# Patient Record
Sex: Male | Born: 1950 | Race: White | Hispanic: No | Smoking: Current some day smoker
Health system: Southern US, Community
[De-identification: ages and names within clinical notes are randomized; demographics above are authoritative.]

## PROBLEM LIST (undated history)

## (undated) DIAGNOSIS — D72829 Elevated white blood cell count, unspecified: Secondary | ICD-10-CM

## (undated) DIAGNOSIS — E1169 Type 2 diabetes mellitus with other specified complication: Secondary | ICD-10-CM

## (undated) DIAGNOSIS — Z8619 Personal history of other infectious and parasitic diseases: Secondary | ICD-10-CM

## (undated) DIAGNOSIS — Z8673 Personal history of transient ischemic attack (TIA), and cerebral infarction without residual deficits: Secondary | ICD-10-CM

## (undated) DIAGNOSIS — R140 Abdominal distension (gaseous): Secondary | ICD-10-CM

## (undated) DIAGNOSIS — I509 Heart failure, unspecified: Secondary | ICD-10-CM

## (undated) DIAGNOSIS — M1990 Unspecified osteoarthritis, unspecified site: Secondary | ICD-10-CM

## (undated) DIAGNOSIS — G609 Hereditary and idiopathic neuropathy, unspecified: Secondary | ICD-10-CM

## (undated) DIAGNOSIS — I350 Nonrheumatic aortic (valve) stenosis: Secondary | ICD-10-CM

## (undated) DIAGNOSIS — I10 Essential (primary) hypertension: Secondary | ICD-10-CM

## (undated) DIAGNOSIS — Z720 Tobacco use: Secondary | ICD-10-CM

## (undated) DIAGNOSIS — F329 Major depressive disorder, single episode, unspecified: Secondary | ICD-10-CM

## (undated) DIAGNOSIS — T7840XA Allergy, unspecified, initial encounter: Secondary | ICD-10-CM

## (undated) DIAGNOSIS — E785 Hyperlipidemia, unspecified: Secondary | ICD-10-CM

## (undated) DIAGNOSIS — F32A Depression, unspecified: Secondary | ICD-10-CM

## (undated) DIAGNOSIS — J302 Other seasonal allergic rhinitis: Secondary | ICD-10-CM

## (undated) DIAGNOSIS — Z8709 Personal history of other diseases of the respiratory system: Secondary | ICD-10-CM

## (undated) DIAGNOSIS — E669 Obesity, unspecified: Secondary | ICD-10-CM

## (undated) DIAGNOSIS — I639 Cerebral infarction, unspecified: Secondary | ICD-10-CM

## (undated) HISTORY — DX: Type 2 diabetes mellitus with other specified complication: E11.69

## (undated) HISTORY — PX: KNEE SURGERY: SHX244

## (undated) HISTORY — DX: Nonrheumatic aortic (valve) stenosis: I35.0

## (undated) HISTORY — DX: Personal history of other diseases of the respiratory system: Z87.09

## (undated) HISTORY — DX: Other seasonal allergic rhinitis: J30.2

## (undated) HISTORY — DX: Elevated white blood cell count, unspecified: D72.829

## (undated) HISTORY — DX: Allergy, unspecified, initial encounter: T78.40XA

## (undated) HISTORY — DX: Hyperlipidemia, unspecified: E78.5

## (undated) HISTORY — DX: Hereditary and idiopathic neuropathy, unspecified: G60.9

## (undated) HISTORY — DX: Heart failure, unspecified: I50.9

## (undated) HISTORY — DX: Essential (primary) hypertension: I10

## (undated) HISTORY — DX: Depression, unspecified: F32.A

## (undated) HISTORY — DX: Obesity, unspecified: E66.9

## (undated) HISTORY — DX: Personal history of other infectious and parasitic diseases: Z86.19

## (undated) HISTORY — DX: Tobacco use: Z72.0

## (undated) HISTORY — DX: Abdominal distension (gaseous): R14.0

## (undated) HISTORY — DX: Major depressive disorder, single episode, unspecified: F32.9

## (undated) HISTORY — DX: Unspecified osteoarthritis, unspecified site: M19.90

## (undated) HISTORY — DX: Personal history of transient ischemic attack (TIA), and cerebral infarction without residual deficits: Z86.73

---

## 1980-10-18 HISTORY — PX: LUMBAR FUSION: SHX111

## 1990-10-18 DIAGNOSIS — Z8673 Personal history of transient ischemic attack (TIA), and cerebral infarction without residual deficits: Secondary | ICD-10-CM

## 1990-10-18 HISTORY — DX: Personal history of transient ischemic attack (TIA), and cerebral infarction without residual deficits: Z86.73

## 2005-10-18 HISTORY — PX: REVISION TOTAL KNEE ARTHROPLASTY: SHX767

## 2011-09-01 ENCOUNTER — Encounter: Payer: Self-pay | Admitting: Internal Medicine

## 2011-09-01 ENCOUNTER — Ambulatory Visit (INDEPENDENT_AMBULATORY_CARE_PROVIDER_SITE_OTHER): Payer: Commercial Managed Care - PPO | Admitting: Internal Medicine

## 2011-09-01 ENCOUNTER — Telehealth: Payer: Self-pay | Admitting: Internal Medicine

## 2011-09-01 VITALS — BP 134/90 | HR 73 | Temp 97.7°F | Resp 20 | Ht 71.5 in | Wt 267.0 lb

## 2011-09-01 DIAGNOSIS — I10 Essential (primary) hypertension: Secondary | ICD-10-CM | POA: Insufficient documentation

## 2011-09-01 DIAGNOSIS — E119 Type 2 diabetes mellitus without complications: Secondary | ICD-10-CM

## 2011-09-01 DIAGNOSIS — R011 Cardiac murmur, unspecified: Secondary | ICD-10-CM | POA: Insufficient documentation

## 2011-09-01 DIAGNOSIS — Z23 Encounter for immunization: Secondary | ICD-10-CM

## 2011-09-01 DIAGNOSIS — E785 Hyperlipidemia, unspecified: Secondary | ICD-10-CM

## 2011-09-01 LAB — HEPATIC FUNCTION PANEL
ALT: 21 U/L (ref 0–53)
AST: 17 U/L (ref 0–37)
Albumin: 5 g/dL (ref 3.5–5.2)
Alkaline Phosphatase: 68 U/L (ref 39–117)
Indirect Bilirubin: 0.3 mg/dL (ref 0.0–0.9)
Total Protein: 7.4 g/dL (ref 6.0–8.3)

## 2011-09-01 LAB — HEMOGLOBIN A1C: Mean Plasma Glucose: 183 mg/dL — ABNORMAL HIGH (ref <117)

## 2011-09-01 LAB — BASIC METABOLIC PANEL
BUN: 19 mg/dL (ref 6–23)
Chloride: 101 mEq/L (ref 96–112)
Creat: 1.26 mg/dL (ref 0.50–1.35)
Glucose, Bld: 113 mg/dL — ABNORMAL HIGH (ref 70–99)

## 2011-09-01 LAB — CBC
HCT: 45.9 % (ref 39.0–52.0)
Hemoglobin: 15.9 g/dL (ref 13.0–17.0)
MCHC: 34.6 g/dL (ref 30.0–36.0)
MCV: 92.9 fL (ref 78.0–100.0)
RDW: 13.6 % (ref 11.5–15.5)
WBC: 11.2 10*3/uL — ABNORMAL HIGH (ref 4.0–10.5)

## 2011-09-01 MED ORDER — AMITRIPTYLINE HCL 10 MG PO TABS: 10.0000 mg | ORAL_TABLET | Freq: Every day | ORAL | Status: DC

## 2011-09-01 NOTE — Assessment & Plan Note (Signed)
Asx. Next visit discuss f/u echo

## 2011-09-01 NOTE — Progress Notes (Signed)
  Subjective:    Patient ID: Gabriel [name], male    DOB: 1951/01/07, 60 y.o.   MRN: [id number]  HPI Pt presents to clinic to establish care and for evaluation of multiple medical problems. H/o dm with fsbs log reviewed. Variable results with range 90's to upper 100's. No hypoglycemia. Attempting to make dietary changes with PP fsbs as a guide. Dm eye exam utd. No feet paresthesias. Tolerates statin tx without myalgias or abn lft's. H/o murmur s/p echo `1 year ago with no recommendation for intervention. bp minimally elevated but typically normotensive. Has single episode of ?CHF with reportedly subsequent nl cardiac catheterization  2011.no other complaints.  Reviewed pmh, psh, medications, allergies, soc hx and fam hx    Review of Systems  Constitutional: Negative for fever and chills.  Respiratory: Negative for cough and shortness of breath.   Cardiovascular: Negative for chest pain.  Neurological: Negative for numbness.  All other systems reviewed and are negative.       Objective:   Physical Exam  Physical Exam  Nursing note and vitals reviewed. Constitutional: Appears well-developed and well-nourished. No distress.  HENT:  Head: Normocephalic and atraumatic.  Right Ear: External ear normal.  Left Ear: External ear normal.  Eyes: Conjunctivae are normal. No scleral icterus.  Neck: Neck supple. Carotid bruit is not present.  Cardiovascular: Normal rate, regular rhythm and normal heart sounds.  Exam reveals no gallop and no friction rub.   3/6 SM best heard rsb. Pulmonary/Chest: Effort normal and breath sounds normal. No respiratory distress. He has no wheezes. no rales.  Lymphadenopathy:    He has no cervical adenopathy.  Neurological:Alert.  Skin: Skin is warm and dry. Not diaphoretic.  Psychiatric: Has a normal mood and affect.   Diabetic foot exam: +2 DP pulses, no diabetic wounds, ulcerations or significant callousing. Monofilament exam nl.       Assessment & Plan:

## 2011-09-01 NOTE — Assessment & Plan Note (Signed)
Obtain cbc, chem7, a1c, urine microalbumin 

## 2011-09-01 NOTE — Assessment & Plan Note (Signed)
Not fasting. Obtain lft. Obtain lipid prior to next visit.

## 2011-09-01 NOTE — Patient Instructions (Signed)
Please schedule chem7, a1c 250.0 and lipid 272.4 prior to next visit

## 2011-09-01 NOTE — Assessment & Plan Note (Signed)
Normotensive and stable. Continue current regimen. Monitor bp as outpt and followup in clinic as scheduled.  

## 2011-09-02 LAB — MICROALBUMIN / CREATININE URINE RATIO: Microalb Creat Ratio: 339.8 mg/g — ABNORMAL HIGH (ref 0.0–30.0)

## 2011-09-02 NOTE — Telephone Encounter (Signed)
Lab orders entered for February 2013. 

## 2011-09-03 ENCOUNTER — Other Ambulatory Visit: Payer: Self-pay | Admitting: *Deleted

## 2011-09-03 MED ORDER — INSULIN NPH ISOPHANE & REGULAR (70-30) 100 UNIT/ML ~~LOC~~ SUSP: SUBCUTANEOUS | Status: DC

## 2011-09-03 NOTE — Telephone Encounter (Signed)
Patient called requesting refill on insulin to  [facility] mail order. Rx refill sent to pharmacy.

## 2011-09-10 ENCOUNTER — Other Ambulatory Visit: Payer: Self-pay | Admitting: *Deleted

## 2011-09-10 ENCOUNTER — Telehealth: Payer: Self-pay | Admitting: Internal Medicine

## 2011-09-10 MED ORDER — LISINOPRIL 20 MG PO TABS: ORAL_TABLET | ORAL | Status: DC

## 2011-09-10 MED ORDER — LISINOPRIL 20 MG PO TABS: 30.0000 mg | ORAL_TABLET | Freq: Every day | ORAL | Status: DC

## 2011-09-10 NOTE — Telephone Encounter (Signed)
The sig says to take 1.5 pills (30mg  per day) and it also says take 2 pills per day.  Please advise pharmacy which is correct

## 2011-09-10 NOTE — Telephone Encounter (Signed)
Rx resent to reflect correct instructions.

## 2011-09-10 NOTE — Telephone Encounter (Signed)
Rx  sent  to  [facility]

## 2011-09-22 ENCOUNTER — Telehealth: Payer: Self-pay | Admitting: *Deleted

## 2011-09-22 MED ORDER — INSULIN NPH ISOPHANE & REGULAR (70-30) 100 UNIT/ML ~~LOC~~ SUSP: SUBCUTANEOUS | Status: DC

## 2011-09-22 MED ORDER — GEMFIBROZIL 600 MG PO TABS: 600.0000 mg | ORAL_TABLET | Freq: Two times a day (BID) | ORAL | Status: DC

## 2011-09-22 NOTE — Telephone Encounter (Signed)
Fax received from [facility] needing clarification on whether patient uses vials or pens.  Call placed to patient at [phone], he has verified that he uses the vials. He stated that he is will need a refill on Lopid to [facility] pharmacy.  Rx verification and refill sent to pharmacy.

## 2011-09-30 ENCOUNTER — Telehealth: Payer: Self-pay | Admitting: Internal Medicine

## 2011-09-30 MED ORDER — FUROSEMIDE 40 MG PO TABS: 40.0000 mg | ORAL_TABLET | Freq: Every day | ORAL | Status: DC

## 2011-09-30 NOTE — Telephone Encounter (Signed)
Received voice message from [facility] that they are needing clarification of Humulin Rx sent in on 09/03/11. Called [phone] with ref# [id number] and advised Gabriel [name] that clarification was sent back to them on 09/22/11. She states they did not receive transmission and verbal was given. They will dispense 18 vials for a 90 day supply with 1 refill.

## 2011-09-30 NOTE — Telephone Encounter (Signed)
[facility] [address]

## 2011-10-20 ENCOUNTER — Telehealth: Payer: Self-pay | Admitting: Internal Medicine

## 2011-10-20 NOTE — Telephone Encounter (Signed)
Attempted to reach pt for clarification of current dose and directions. Left detailed message to call us with clarification.

## 2011-10-20 NOTE — Telephone Encounter (Signed)
Patient states that he called [facility] [address] to refill sertaline but pharmacy told him to call us for refill

## 2011-10-21 MED ORDER — SERTRALINE HCL 100 MG PO TABS: ORAL_TABLET | ORAL | Status: DC

## 2011-10-21 NOTE — Telephone Encounter (Signed)
Pt returned my call and states that he takes 150mg  of Sertraline daily. He takes 1 1/2  100mg  tabs daily. Refill sent to pharmacy #[id number] x 1 refill.

## 2011-10-26 ENCOUNTER — Other Ambulatory Visit: Payer: Self-pay | Admitting: *Deleted

## 2011-10-26 MED ORDER — CARVEDILOL 25 MG PO TABS: ORAL_TABLET | ORAL | Status: DC

## 2011-10-26 NOTE — Telephone Encounter (Signed)
Pt called requesting refill of Carvedilol be sent to [facility] on [address] in [place].

## 2011-11-05 ENCOUNTER — Ambulatory Visit (INDEPENDENT_AMBULATORY_CARE_PROVIDER_SITE_OTHER): Payer: Commercial Managed Care - PPO | Admitting: Internal Medicine

## 2011-11-05 ENCOUNTER — Encounter: Payer: Self-pay | Admitting: Internal Medicine

## 2011-11-05 ENCOUNTER — Telehealth: Payer: Self-pay | Admitting: *Deleted

## 2011-11-05 VITALS — BP 150/90 | HR 73 | Temp 98.0°F | Resp 20 | Ht 71.5 in | Wt 268.0 lb

## 2011-11-05 DIAGNOSIS — R079 Chest pain, unspecified: Secondary | ICD-10-CM

## 2011-11-05 DIAGNOSIS — R0781 Pleurodynia: Secondary | ICD-10-CM

## 2011-11-05 MED ORDER — METFORMIN HCL 1000 MG PO TABS: 1000.0000 mg | ORAL_TABLET | Freq: Two times a day (BID) | ORAL | Status: DC

## 2011-11-05 NOTE — Progress Notes (Signed)
  Subjective:    Patient ID: Gabriel [name], male    DOB: 07-13-1951, 61 y.o.   MRN: [id number]  HPI Pt presents to clinic for ED follow up of fall. Seen at local ED 1/17 after accidental fall (stepped on stick) and fell with his left hand against his left chest. Also struck left anterior knee. Underwent neg CXR without ptx or fx. Did not strike head or suffer loc. Given vicodin but took one dose then began otc nsaid prn. Left knee suffered abrasion that he is using topical abx. No knee instability. No further falls. Needs return to work note. No other complaints.  Past Medical History  Diagnosis Date  . Arthritis   . Depression   . History of chronic bronchitis     dx 2011  . Seasonal allergies   . Heart murmur   . Hypertension   . Diabetes mellitus, type 2   . Hyperlipidemia   . 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 Congestive heart failure     September 2011-CHF   Past Surgical History  Procedure Date  . Knee surgery 1976    right knee torn ligament repair  . Lumbar fusion 1982    L4-L5  . Revision total knee arthroplasty 2007    right knee replacement    reports that he has been smoking.  He has never used smokeless tobacco. He reports that he drinks alcohol. He reports that he does not use illicit drugs. family history includes Arthritis in an unspecified family member; Diabetes in his brother and paternal grandmother; Emotional abuse in his mother; Heart disease in his father; and Hypertension in his brother, father, and paternal grandfather. No Known Allergies   Review of Systems see hpi     Objective:   Physical Exam  Nursing note and vitals reviewed. Constitutional: He appears well-developed and well-nourished.  HENT:  Head: Normocephalic and atraumatic.  Right Ear: External ear normal.  Left Ear: External ear normal.  Pulmonary/Chest: Effort normal and breath sounds normal. No respiratory distress. He has no wheezes.    Musculoskeletal:       Left knee abrasion. Gait nl  Neurological: He is alert.  Skin: Skin is warm.  Psychiatric: He has a normal mood and affect.          Assessment & Plan:

## 2011-11-05 NOTE — Telephone Encounter (Signed)
Pt called back stating he was seen at [facility] ED on 11/04/11. Request for records faxed to [phone].

## 2011-11-05 NOTE — Telephone Encounter (Signed)
Pt called requesting refill of metformin be sent to [facility] on [address]. Also requests ER f/u to get clearance to return to work after a fall. Pt requests appt today. Appt given for 2:15pm with Dr Rodena Medin. Refill sent to pharmacy #[id number] x 2 refills.

## 2011-11-05 NOTE — Telephone Encounter (Signed)
Left message for pt to return my call with name or ER and date of ER visit.

## 2011-11-07 DIAGNOSIS — R0781 Pleurodynia: Secondary | ICD-10-CM | POA: Insufficient documentation

## 2011-11-07 NOTE — Assessment & Plan Note (Signed)
Use otc pain medication prn. Return to work note provided. Followup if no improvement or worsening.

## 2011-11-18 ENCOUNTER — Telehealth: Payer: Self-pay | Admitting: Internal Medicine

## 2011-11-18 MED ORDER — LISINOPRIL 30 MG PO TABS: ORAL_TABLET | ORAL | Status: DC

## 2011-11-18 NOTE — Telephone Encounter (Signed)
Rx refill sent to pharmacy. 

## 2011-11-19 ENCOUNTER — Telehealth: Payer: Self-pay | Admitting: Internal Medicine

## 2011-11-19 NOTE — Telephone Encounter (Signed)
Pharmacy called for verification of metformin dosing. They needed clarification on dosing instructions. Pharmacist was informed patient current dose is 30 mg once a day. Medication corrected in chart to reflect current dosing instructions.

## 2011-11-29 ENCOUNTER — Other Ambulatory Visit: Payer: Self-pay | Admitting: *Deleted

## 2011-11-29 NOTE — Telephone Encounter (Signed)
Received message from pt requesting refill of Amlodipine. Left message for pt to call with name of pharmacy.

## 2011-11-30 MED ORDER — AMLODIPINE BESYLATE 10 MG PO TABS: 10.0000 mg | ORAL_TABLET | Freq: Every day | ORAL | Status: DC

## 2011-11-30 MED ORDER — PRAVASTATIN SODIUM 20 MG PO TABS: 20.0000 mg | ORAL_TABLET | Freq: Every evening | ORAL | Status: DC

## 2011-11-30 NOTE — Telephone Encounter (Signed)
Pt left voice message to send refill to [facility] on [address]. Refills sent.

## 2011-12-02 ENCOUNTER — Encounter: Payer: Self-pay | Admitting: Internal Medicine

## 2011-12-02 ENCOUNTER — Ambulatory Visit (INDEPENDENT_AMBULATORY_CARE_PROVIDER_SITE_OTHER): Payer: Commercial Managed Care - PPO | Admitting: Internal Medicine

## 2011-12-02 ENCOUNTER — Ambulatory Visit (HOSPITAL_BASED_OUTPATIENT_CLINIC_OR_DEPARTMENT_OTHER)
Admission: RE | Admit: 2011-12-02 | Discharge: 2011-12-02 | Disposition: A | Discharge status: Home / Self Care | Payer: Commercial Managed Care - PPO | Source: Ambulatory Visit | Attending: Internal Medicine | Admitting: Internal Medicine

## 2011-12-02 DIAGNOSIS — M79646 Pain in unspecified finger(s): Secondary | ICD-10-CM

## 2011-12-02 DIAGNOSIS — M79609 Pain in unspecified limb: Secondary | ICD-10-CM

## 2011-12-02 DIAGNOSIS — E119 Type 2 diabetes mellitus without complications: Secondary | ICD-10-CM

## 2011-12-02 DIAGNOSIS — E785 Hyperlipidemia, unspecified: Secondary | ICD-10-CM

## 2011-12-02 DIAGNOSIS — R011 Cardiac murmur, unspecified: Secondary | ICD-10-CM

## 2011-12-02 LAB — HEMOGLOBIN A1C
Hgb A1c MFr Bld: 8.5 % — ABNORMAL HIGH (ref <5.7)
Mean Plasma Glucose: 197 mg/dL — ABNORMAL HIGH (ref <117)

## 2011-12-02 LAB — BASIC METABOLIC PANEL
Calcium: 9.9 mg/dL (ref 8.4–10.5)
Creat: 0.95 mg/dL (ref 0.50–1.35)
Sodium: 136 mEq/L (ref 135–145)

## 2011-12-02 LAB — LIPID PANEL
Cholesterol: 161 mg/dL (ref 0–200)
Triglycerides: 372 mg/dL — ABNORMAL HIGH (ref <150)
VLDL: 74 mg/dL — ABNORMAL HIGH (ref 0–40)

## 2011-12-02 MED ORDER — MELOXICAM 7.5 MG PO TABS: 7.5000 mg | ORAL_TABLET | Freq: Every day | ORAL | Status: AC | PRN

## 2011-12-02 NOTE — Progress Notes (Signed)
  Subjective:    Patient ID: Gabriel [name], male    DOB: 08/28/1951, 61 y.o.   MRN: [id number]  HPI Pt presents to clinic for followup of multiple medical problems. Notes right thumb pain without injury or trauma. No inflammatory changes. fsbs range 75-230 with typical range 130-135. bp reviewed as normotensive. Diabetic eye exam pending. No other complaints.  Past Medical History  Diagnosis Date  . Arthritis   . Depression   . History of chronic bronchitis     dx 2011  . Seasonal allergies   . Heart murmur   . Hypertension   . Diabetes mellitus, type 2   . Hyperlipidemia   . 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 Congestive heart failure     September 2011-CHF   Past Surgical History  Procedure Date  . Knee surgery 1976    right knee torn ligament repair  . Lumbar fusion 1982    L4-L5  . Revision total knee arthroplasty 2007    right knee replacement    reports that he has been smoking.  He has never used smokeless tobacco. He reports that he drinks alcohol. He reports that he does not use illicit drugs. family history includes Arthritis in an unspecified family member; Diabetes in his brother and paternal grandmother; Emotional abuse in his mother; Heart disease in his father; and Hypertension in his brother, father, and paternal grandfather. No Known Allergies    Review of Systems see hpi     Objective:   Physical Exam  Physical Exam  Nursing note and vitals reviewed. Constitutional: Appears well-developed and well-nourished. No distress.  HENT:  Head: Normocephalic and atraumatic.  Right Ear: External ear normal.  Left Ear: External ear normal.  Eyes: Conjunctivae are normal. No scleral icterus.  Neck: Neck supple. Carotid bruit is not present.  Cardiovascular: Normal rate, regular rhythm and normal heart sounds.  Exam reveals no gallop and no friction rub.   +3/6 best heard at RSB Pulmonary/Chest: Effort normal and  breath sounds normal. No respiratory distress. He has no wheezes. no rales.  Lymphadenopathy:    He has no cervical adenopathy.  Neurological:Alert.  Skin: Skin is warm and dry. Not diaphoretic.  Psychiatric: Has a normal mood and affect.        Assessment & Plan:

## 2011-12-02 NOTE — Patient Instructions (Signed)
Please schedule cbc, chem7, a1c, urine microalbumin-250.0 and lipid/lft 272.4, psa (prostate cancer screening) prior to next visit. We will schedule an echocardiogram to evaluate your heart murmur

## 2011-12-12 DIAGNOSIS — M79646 Pain in unspecified finger(s): Secondary | ICD-10-CM | POA: Insufficient documentation

## 2011-12-12 NOTE — Assessment & Plan Note (Signed)
Obtain chem7 and a1c 

## 2011-12-12 NOTE — Assessment & Plan Note (Signed)
Schedule 2d echo 

## 2011-12-12 NOTE — Assessment & Plan Note (Signed)
Obtain right thumb xray. Attempt mobic prn with food and no other nsaids.

## 2011-12-14 ENCOUNTER — Ambulatory Visit (HOSPITAL_COMMUNITY): Payer: Commercial Managed Care - PPO | Attending: Cardiovascular Disease

## 2011-12-14 ENCOUNTER — Other Ambulatory Visit: Payer: Self-pay

## 2011-12-14 DIAGNOSIS — E785 Hyperlipidemia, unspecified: Secondary | ICD-10-CM

## 2011-12-14 DIAGNOSIS — E119 Type 2 diabetes mellitus without complications: Secondary | ICD-10-CM | POA: Insufficient documentation

## 2011-12-14 DIAGNOSIS — Z125 Encounter for screening for malignant neoplasm of prostate: Secondary | ICD-10-CM

## 2011-12-14 DIAGNOSIS — I079 Rheumatic tricuspid valve disease, unspecified: Secondary | ICD-10-CM | POA: Insufficient documentation

## 2011-12-14 DIAGNOSIS — I080 Rheumatic disorders of both mitral and aortic valves: Secondary | ICD-10-CM | POA: Insufficient documentation

## 2011-12-14 DIAGNOSIS — R011 Cardiac murmur, unspecified: Secondary | ICD-10-CM | POA: Insufficient documentation

## 2011-12-14 DIAGNOSIS — I10 Essential (primary) hypertension: Secondary | ICD-10-CM | POA: Insufficient documentation

## 2011-12-17 ENCOUNTER — Telehealth: Payer: Self-pay | Admitting: *Deleted

## 2011-12-17 NOTE — Telephone Encounter (Signed)
Call placed to patient at [phone] to inform of lab results per Dr Rodena Medin instructions. There was no answer. A detailed voice message was left informing patient per Dr Rodena Medin regarding lab results. A voice message was left for patient to return phone call with his current insulin dose and blood sugar readings.

## 2011-12-24 NOTE — Telephone Encounter (Signed)
Letter mailed to patient to contact office regarding lab results 

## 2011-12-28 ENCOUNTER — Other Ambulatory Visit: Payer: Self-pay | Admitting: *Deleted

## 2011-12-28 MED ORDER — GLIPIZIDE 10 MG PO TABS: 10.0000 mg | ORAL_TABLET | Freq: Two times a day (BID) | ORAL | Status: DC

## 2011-12-28 NOTE — Telephone Encounter (Signed)
Received message from pt requesting refill of glipizide to [facility] on [address]. Refill sent #60 x 2 refills.

## 2011-12-28 NOTE — Telephone Encounter (Signed)
Left message of refill completion on pt's voicemail.

## 2011-12-29 ENCOUNTER — Telehealth: Payer: Self-pay | Admitting: Internal Medicine

## 2011-12-29 MED ORDER — SERTRALINE HCL 100 MG PO TABS: ORAL_TABLET | ORAL | Status: DC

## 2011-12-29 NOTE — Telephone Encounter (Signed)
Refill- zoloft 100mg  tab. Take one and one-half tablets by mouth at bedtime. Qty 45 last fill 1.31.13

## 2011-12-29 NOTE — Telephone Encounter (Signed)
Rx refill sent to pharmacy. 

## 2012-01-05 ENCOUNTER — Telehealth: Payer: Self-pay | Admitting: *Deleted

## 2012-01-05 NOTE — Telephone Encounter (Signed)
Patient called and left voice message requesting a refill on Furosemide. His message stated that he received the voice message that was left left regarding his blood sugars and insulin.  Call placed to patient at [phone], no answer. A voice message was left for patient to return phone call with his current insulin dose and blood sugar readings.

## 2012-01-06 MED ORDER — FUROSEMIDE 40 MG PO TABS: 40.0000 mg | ORAL_TABLET | Freq: Every day | ORAL | Status: DC

## 2012-01-06 NOTE — Telephone Encounter (Signed)
Refill sent to [facility] #30 x 2 refills and pt notified. Pt states he is currently taking Humulin NPH 70/30,  86 units every morning and 82 units at bedtime.  Pt reports the following FSBS readings:  FBS  2hrs post prandial (lunch) 3/11  63 122 3/12  149 172 3/13  141 159 3/14  101 129 3/15  101 129 3/16  198 163 3/17  151 177 3/18  141 159 3/19  130 122 3/20  113 99  Pt states he has not checked his control solution since December but will check it and let us know if control solution is in range. Pt reports that his FBS on 12/02/11 was 170. Lab reports FBS 264 for the lab draw that day and pt states he was fasting when he had labs drawn.  Please advise.

## 2012-01-07 NOTE — Telephone Encounter (Signed)
Call placed to patient at [phone], no answer. A detailed voice message was left informing patient per Dr Rodena Medin instructions. He was advised to call back if any questions.

## 2012-01-07 NOTE — Telephone Encounter (Signed)
a1c had increased from 8 to 8.5. Either his fsbs control has improved or his glucometer isn't accurate. If it is old or any question about accuracy then new script for machine. Otherwise low sugar/carb diet and exercise- see what next a1c shows

## 2012-02-02 ENCOUNTER — Telehealth: Payer: Self-pay | Admitting: Internal Medicine

## 2012-02-02 MED ORDER — GEMFIBROZIL 600 MG PO TABS: 600.0000 mg | ORAL_TABLET | Freq: Two times a day (BID) | ORAL | Status: DC

## 2012-02-02 NOTE — Telephone Encounter (Signed)
Refill-gemfibrozil 600mg  tab. Take one tablet by mouth twice daily before a meal. Qty 60 last fill 3.17.13

## 2012-02-02 NOTE — Telephone Encounter (Signed)
Rx refill sent to pharmacy. 

## 2012-02-10 ENCOUNTER — Telehealth: Payer: Self-pay | Admitting: Internal Medicine

## 2012-02-10 MED ORDER — CARVEDILOL 25 MG PO TABS: ORAL_TABLET | ORAL | Status: DC

## 2012-02-10 NOTE — Telephone Encounter (Signed)
Rx refill sent to pharmacy. 

## 2012-02-10 NOTE — Telephone Encounter (Signed)
Refill-carvedilol 25mg  tab. Take one and one-half tablets by mouth twice daily. Qty 90 last fill 3.17.13

## 2012-02-14 ENCOUNTER — Telehealth: Payer: Self-pay | Admitting: Internal Medicine

## 2012-02-14 MED ORDER — METFORMIN HCL 1000 MG PO TABS: 1000.0000 mg | ORAL_TABLET | Freq: Two times a day (BID) | ORAL | Status: DC

## 2012-02-14 NOTE — Telephone Encounter (Signed)
Rx Refill sent to pharmacy.

## 2012-02-14 NOTE — Telephone Encounter (Signed)
Refill- metformin 1000mg  tab. Take one tablet by mouth twice daily with meals. Qty 60 last fill 3.25.13

## 2012-02-29 ENCOUNTER — Ambulatory Visit (INDEPENDENT_AMBULATORY_CARE_PROVIDER_SITE_OTHER): Payer: Commercial Managed Care - PPO | Admitting: Internal Medicine

## 2012-02-29 ENCOUNTER — Encounter: Payer: Self-pay | Admitting: Internal Medicine

## 2012-02-29 VITALS — BP 148/90 | HR 80 | Temp 97.9°F | Resp 20 | Wt 261.0 lb

## 2012-02-29 DIAGNOSIS — J40 Bronchitis, not specified as acute or chronic: Secondary | ICD-10-CM

## 2012-02-29 DIAGNOSIS — J029 Acute pharyngitis, unspecified: Secondary | ICD-10-CM

## 2012-02-29 MED ORDER — ALBUTEROL SULFATE HFA 108 (90 BASE) MCG/ACT IN AERS: 2.0000 | INHALATION_SPRAY | Freq: Four times a day (QID) | RESPIRATORY_TRACT | Status: DC | PRN

## 2012-02-29 MED ORDER — DOXYCYCLINE HYCLATE 100 MG PO TABS: 100.0000 mg | ORAL_TABLET | Freq: Two times a day (BID) | ORAL | Status: AC

## 2012-03-02 ENCOUNTER — Ambulatory Visit: Payer: Commercial Managed Care - PPO | Admitting: Internal Medicine

## 2012-03-05 DIAGNOSIS — J40 Bronchitis, not specified as acute or chronic: Secondary | ICD-10-CM | POA: Insufficient documentation

## 2012-03-05 NOTE — Progress Notes (Signed)
  Subjective:    Patient ID: Gabriel [name], male    DOB: 1951-04-20, 61 y.o.   MRN: [id number]  HPI Pt presents to clinic for evaluation of ST. Notes 4d h/o myalgias, st, nasal congestion and cough productive for green sputum. No sick exposure. Taking otc medication without improvement. Continues using tobacco daily. No alleviating or exacerbating factors.   Past Medical History  Diagnosis Date  . Arthritis   . Depression   . History of chronic bronchitis     dx 2011  . Seasonal allergies   . Heart murmur   . Hypertension   . Diabetes mellitus, type 2   . Hyperlipidemia   . 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 Congestive heart failure     September 2011-CHF   Past Surgical History  Procedure Date  . Knee surgery 1976    right knee torn ligament repair  . Lumbar fusion 1982    L4-L5  . Revision total knee arthroplasty 2007    right knee replacement    reports that he has been smoking.  He has never used smokeless tobacco. He reports that he drinks alcohol. He reports that he does not use illicit drugs. family history includes Arthritis in an unspecified family member; Diabetes in his brother and paternal grandmother; Emotional abuse in his mother; Heart disease in his father; and Hypertension in his brother, father, and paternal grandfather. No Known Allergies   Review of Systems see hpi     Objective:   Physical Exam  Nursing note and vitals reviewed. Constitutional: He appears well-developed and well-nourished. No distress.  HENT:  Head: Normocephalic and atraumatic.  Mouth/Throat: Oropharynx is clear and moist. No oropharyngeal exudate.  Eyes: Conjunctivae are normal. Right eye exhibits no discharge. Left eye exhibits no discharge. No scleral icterus.  Neck: Neck supple.  Cardiovascular: Normal rate, regular rhythm and normal heart sounds.   Pulmonary/Chest: Effort normal. No respiratory distress. He has no rales.   Slight bibasilar wheezing  Neurological: He is alert.  Skin: Skin is warm and dry. He is not diaphoretic.  Psychiatric: He has a normal mood and affect.          Assessment & Plan:

## 2012-03-05 NOTE — Assessment & Plan Note (Signed)
Begin abx and albuterol mdi prn. Followup if no improvement or worsening.

## 2012-03-07 ENCOUNTER — Ambulatory Visit: Payer: Commercial Managed Care - PPO | Admitting: Internal Medicine

## 2012-03-07 ENCOUNTER — Telehealth: Payer: Self-pay | Admitting: Internal Medicine

## 2012-03-07 MED ORDER — PRAVASTATIN SODIUM 20 MG PO TABS: 20.0000 mg | ORAL_TABLET | Freq: Every evening | ORAL | Status: DC

## 2012-03-07 MED ORDER — AMLODIPINE BESYLATE 10 MG PO TABS: 10.0000 mg | ORAL_TABLET | Freq: Every day | ORAL | Status: DC

## 2012-03-07 NOTE — Telephone Encounter (Signed)
Refill-pravastatin 20mg  tab. Take one tablet by mouth every evening. Qty 30 last fill 4.17.13  Refill-amlodipine 10mg  tab. Take one tablet by mouth every day. Qty 30 last fill 4.17.13

## 2012-03-07 NOTE — Telephone Encounter (Signed)
Call placed to patient at [phone] Rx refill sent to pharmacy.

## 2012-03-08 ENCOUNTER — Ambulatory Visit: Payer: Commercial Managed Care - PPO | Admitting: Internal Medicine

## 2012-03-09 ENCOUNTER — Other Ambulatory Visit: Payer: Self-pay | Admitting: Internal Medicine

## 2012-03-09 ENCOUNTER — Other Ambulatory Visit: Payer: Self-pay | Admitting: *Deleted

## 2012-03-09 DIAGNOSIS — E785 Hyperlipidemia, unspecified: Secondary | ICD-10-CM

## 2012-03-09 DIAGNOSIS — Z125 Encounter for screening for malignant neoplasm of prostate: Secondary | ICD-10-CM

## 2012-03-09 DIAGNOSIS — E119 Type 2 diabetes mellitus without complications: Secondary | ICD-10-CM

## 2012-03-10 LAB — CBC
HCT: 44.3 % (ref 39.0–52.0)
Hemoglobin: 14.7 g/dL (ref 13.0–17.0)
MCV: 94.9 fL (ref 78.0–100.0)
RBC: 4.67 MIL/uL (ref 4.22–5.81)
WBC: 9.2 10*3/uL (ref 4.0–10.5)

## 2012-03-10 LAB — HEPATIC FUNCTION PANEL
Albumin: 4.8 g/dL (ref 3.5–5.2)
Alkaline Phosphatase: 69 U/L (ref 39–117)
Bilirubin, Direct: 0.1 mg/dL (ref 0.0–0.3)
Total Bilirubin: 0.3 mg/dL (ref 0.3–1.2)

## 2012-03-10 LAB — BASIC METABOLIC PANEL
BUN: 15 mg/dL (ref 6–23)
CO2: 28 mEq/L (ref 19–32)
Chloride: 99 mEq/L (ref 96–112)
Creat: 1.07 mg/dL (ref 0.50–1.35)
Glucose, Bld: 161 mg/dL — ABNORMAL HIGH (ref 70–99)

## 2012-03-10 LAB — PSA: PSA: 0.35 ng/mL (ref <=4.00)

## 2012-03-10 LAB — LIPID PANEL
HDL: 25 mg/dL — ABNORMAL LOW (ref >39)
LDL Cholesterol: 36 mg/dL (ref 0–99)

## 2012-03-17 ENCOUNTER — Ambulatory Visit (INDEPENDENT_AMBULATORY_CARE_PROVIDER_SITE_OTHER): Payer: Commercial Managed Care - PPO | Admitting: Internal Medicine

## 2012-03-17 ENCOUNTER — Encounter: Payer: Self-pay | Admitting: Internal Medicine

## 2012-03-17 ENCOUNTER — Ambulatory Visit (HOSPITAL_BASED_OUTPATIENT_CLINIC_OR_DEPARTMENT_OTHER)
Admission: RE | Admit: 2012-03-17 | Discharge: 2012-03-17 | Disposition: A | Discharge status: Home / Self Care | Payer: Commercial Managed Care - PPO | Source: Ambulatory Visit | Attending: Internal Medicine | Admitting: Internal Medicine

## 2012-03-17 VITALS — BP 150/100 | HR 91 | Temp 98.2°F | Resp 20 | Wt 270.0 lb

## 2012-03-17 DIAGNOSIS — J40 Bronchitis, not specified as acute or chronic: Secondary | ICD-10-CM

## 2012-03-17 DIAGNOSIS — N529 Male erectile dysfunction, unspecified: Secondary | ICD-10-CM

## 2012-03-17 DIAGNOSIS — I10 Essential (primary) hypertension: Secondary | ICD-10-CM

## 2012-03-17 DIAGNOSIS — E1165 Type 2 diabetes mellitus with hyperglycemia: Secondary | ICD-10-CM

## 2012-03-17 DIAGNOSIS — R05 Cough: Secondary | ICD-10-CM

## 2012-03-17 DIAGNOSIS — E119 Type 2 diabetes mellitus without complications: Secondary | ICD-10-CM

## 2012-03-17 DIAGNOSIS — R062 Wheezing: Secondary | ICD-10-CM

## 2012-03-17 DIAGNOSIS — R059 Cough, unspecified: Secondary | ICD-10-CM | POA: Insufficient documentation

## 2012-03-17 DIAGNOSIS — R0989 Other specified symptoms and signs involving the circulatory and respiratory systems: Secondary | ICD-10-CM | POA: Insufficient documentation

## 2012-03-17 LAB — HM DIABETES FOOT EXAM

## 2012-03-17 MED ORDER — LISINOPRIL 40 MG PO TABS: 30.0000 mg | ORAL_TABLET | Freq: Every day | ORAL | Status: DC

## 2012-03-17 MED ORDER — VARDENAFIL HCL 10 MG PO TABS: 10.0000 mg | ORAL_TABLET | Freq: Every day | ORAL | Status: DC | PRN

## 2012-03-17 MED ORDER — METHYLPREDNISOLONE ACETATE 40 MG/ML IJ SUSP
40.0000 mg | Freq: Once | INTRAMUSCULAR | Status: AC
  Administered 2012-03-17: 40 mg via INTRAMUSCULAR

## 2012-03-17 NOTE — Assessment & Plan Note (Signed)
Attempt levitra use prn. Reviewed dosing instructions. No nitrate use.

## 2012-03-17 NOTE — Progress Notes (Signed)
  Subjective:    Patient ID: Gabriel [name], male    DOB: 09/21/1951, 61 y.o.   MRN: [id number]  HPI Pt presents to clinic for followup of multiple medical problems. Notes 50% improvement nasal and chest congestion s/p abx course. Denies fever chills or colored sputum production. BP elevated and states has not taken medication today. Urine microalbumin elevated and worsening. a1c worsening with last value of 8.8. fsbs range of 65-294. C/o chronic ED but now interested in possible treatment.  Past Medical History  Diagnosis Date  . Arthritis   . Depression   . History of chronic bronchitis     dx 2011  . Seasonal allergies   . Heart murmur   . Hypertension   . Diabetes mellitus, type 2   . Hyperlipidemia   . 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 Congestive heart failure     September 2011-CHF   Past Surgical History  Procedure Date  . Knee surgery 1976    right knee torn ligament repair  . Lumbar fusion 1982    L4-L5  . Revision total knee arthroplasty 2007    right knee replacement    reports that he has been smoking.  He has never used smokeless tobacco. He reports that he drinks alcohol. He reports that he does not use illicit drugs. family history includes Arthritis in an unspecified family member; Diabetes in his brother and paternal grandmother; Emotional abuse in his mother; Heart disease in his father; and Hypertension in his brother, father, and paternal grandfather. No Known Allergies    Review of Systems see hpi     Objective:   Physical Exam  Physical Exam  Nursing note and vitals reviewed. Constitutional: Appears well-developed and well-nourished. No distress.  HENT:  Head: Normocephalic and atraumatic.  Right Ear: External ear normal.  Left Ear: External ear normal.  Eyes: Conjunctivae are normal. No scleral icterus.  Neck: Neck supple. Carotid bruit is not present.  Cardiovascular: Normal rate, regular rhythm and  normal heart sounds.  Exam reveals no gallop and no friction rub.   No murmur heard. Pulmonary/Chest: Effort normal and breath sounds normal. No respiratory distress. He has no wheezes. no rales.  Lymphadenopathy:    He has no cervical adenopathy.  Neurological:Alert.  Skin: Skin is warm and dry. Not diaphoretic.  Psychiatric: Has a normal mood and affect.        Assessment & Plan:

## 2012-03-17 NOTE — Assessment & Plan Note (Signed)
suboptimal control. Increase ace inhibitor dose.

## 2012-03-17 NOTE — Assessment & Plan Note (Signed)
Improved with associated rhintis. Obtain cxr. Attempt depomedrol 20mg  im injection. Followup if no improvement or worsening.

## 2012-03-17 NOTE — Assessment & Plan Note (Signed)
suboptimal and worsening control. Recommend endocrinology consult.

## 2012-03-28 ENCOUNTER — Ambulatory Visit (INDEPENDENT_AMBULATORY_CARE_PROVIDER_SITE_OTHER): Payer: Commercial Managed Care - PPO | Admitting: Endocrinology

## 2012-03-28 ENCOUNTER — Encounter: Payer: Self-pay | Admitting: Endocrinology

## 2012-03-28 VITALS — BP 142/82 | HR 80 | Temp 97.4°F | Ht 74.0 in | Wt 269.0 lb

## 2012-03-28 DIAGNOSIS — E119 Type 2 diabetes mellitus without complications: Secondary | ICD-10-CM

## 2012-03-28 MED ORDER — INSULIN ASPART 100 UNIT/ML ~~LOC~~ SOLN: 50.0000 [IU] | Freq: Three times a day (TID) | SUBCUTANEOUS | Status: DC

## 2012-03-28 NOTE — Progress Notes (Signed)
Subjective:    Patient ID: Gabriel [name], male    DOB: [birth year], 61 y.o.   MRN: [id number]  HPI pt states 19 years h/o dm. it is complicated by CVA.  he has been on insulin x 11 years.  pt says his diet and exercise are "medium."   He works 3rd shift.   He has few years of slight dryness of the mouth, and assoc polyuria.   He says he has mild hypoglycemia approx 2/week.  It is highest after eating.   Past Medical History  Diagnosis Date  . Arthritis   . Depression   . History of chronic bronchitis     dx 2011  . Seasonal allergies   . Heart murmur   . Hypertension   . Diabetes mellitus, type 2   . Hyperlipidemia   . 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 Congestive heart failure     September 2011-CHF  . History of chicken pox     Past Surgical History  Procedure Date  . Knee surgery 1976, 1989    right knee torn ligament repair  . Lumbar fusion 1982    L4-L5  . Revision total knee arthroplasty 2007    right knee replacement    History   Social History  . Marital Status: Widowed    Spouse Name: N/A    Number of Children: N/A  . 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 Smoking status: Current Everyday Smoker -- 1.0 packs/day  . Smokeless tobacco: Never Used   Comment: smokes a pack per day-started in 1972  . Alcohol Use: No  . Drug Use: No  . Sexually Active: Not on file   Other Topics Concern  . Not on file   Social History Narrative   Regular exercise-noCaffeine Use-yes    Current Outpatient Prescriptions on File Prior to Visit  Medication Sig Dispense Refill  . albuterol (PROVENTIL HFA;VENTOLIN HFA) 108 (90 BASE) MCG/ACT inhaler Inhale 2 puffs into the lungs every 6 (six) hours as needed for wheezing.  1 Inhaler  0  . amitriptyline (ELAVIL) 10 MG tablet Take 1 tablet (10 mg total) by mouth at bedtime.  30 tablet  6  . amLODipine (NORVASC) 10  MG tablet Take 1 tablet (10 mg total) by mouth daily.  30 tablet  3  . aspirin 81 MG tablet Take 81 mg by mouth daily.        . carvedilol (COREG) 25 MG tablet Take one and one half tablet by mouth twice a day  90 tablet  1  . furosemide (LASIX) 40 MG tablet Take 1 tablet (40 mg total) by mouth daily.  30 tablet  2  . gemfibrozil (LOPID) 600 MG tablet Take 1 tablet (600 mg total) by mouth 2 (two) times daily before a meal.  60 tablet  3  . glipiZIDE (GLUCOTROL) 10 MG tablet Take 1 tablet (10 mg total) by mouth 2 (two) times daily before a meal.  60 tablet  2  . insulin NPH-insulin regular (HUMULIN 70/30) (70-30) 100 UNIT/ML injection Inject subcutaneously 86 units in the am and 82 units before bedtime  120 mL  1  . lisinopril (PRINIVIL,ZESTRIL) 40 MG tablet Take 1 tablet (40 mg total) by mouth daily.  30 tablet  6  . meloxicam (MOBIC) 7.5 MG tablet Take 1 tablet (7.5 mg total)  by mouth daily as needed for pain.  30 tablet  2  . metFORMIN (GLUCOPHAGE) 1000 MG tablet Take 1 tablet (1,000 mg total) by mouth 2 (two) times daily with a meal.  60 tablet  2  . Multiple Vitamin (MULTIVITAMIN) tablet Take 1 tablet by mouth daily.        . pravastatin (PRAVACHOL) 20 MG tablet Take 1 tablet (20 mg total) by mouth every evening.  30 tablet  3  . sertraline (ZOLOFT) 100 MG tablet Take one and one half tablets by mouth at bedtime  45 tablet  6  . vardenafil (LEVITRA) 10 MG tablet Take 1 tablet (10 mg total) by mouth daily as needed for erectile dysfunction.  6 tablet  2    No Known Allergies  Family History  Problem Relation Age of Onset  . Arthritis      mother/father/paternal grandparents  . Heart disease Father     pacemaker  . Hypertension Father   . Hypertension Paternal Grandfather   . Hypertension Brother   . Emotional abuse Mother   . Diabetes Brother      x 2  . Diabetes Paternal Grandmother     BP 142/82  Pulse 80  Temp(Src) 97.4 F (36.3 C) (Oral)  Ht 6\' 2"  (1.88 m)  Wt 269 lb  (122.018 kg)  BMI 34.54 kg/m2  SpO2 94%    Review of Systems denies blurry vision, headache, chest pain, sob, n/v, cramps, memory loss, and rhinorrhea.  He has weight gain, depression, ED sxs, easy bruising, and excessive diaphoresis.     Objective:   Physical Exam VS: see vs page GEN: no distress.  obese HEAD: head: no deformity eyes: no periorbital swelling, no proptosis external nose and ears are normal mouth: no lesion seen NECK: supple, thyroid is not enlarged CHEST WALL: no deformity LUNGS: clear to auscultation. CV: reg rate and rhythm.  Soft systolic murmur ABD: abdomen is soft, nontender.  no hepatosplenomegaly.  not distended.  self-reducing midline ventral hernia. MUSCULOSKELETAL: muscle bulk and strength are grossly normal.  no obvious joint swelling.  gait is normal and steady EXTEMITIES: no deformity.  no ulcer on the feet.  feet are of normal color and temp, but there is spotty hyperpigmentation of the legs.  no edema.  Several healed surgical scars on the right knee. PULSES: dorsalis pedis intact bilat.  no carotid bruit, but the murmur is transmitted NEURO:  cn 2-12 grossly intact.   readily moves all 4's.  sensation is intact to touch on the feet SKIN:  Normal texture and temperature.  Diaphoretic. No rash or suspicious lesion is visible.   NODES:  None palpable at the neck PSYCH: alert, oriented x3.  Does not appear anxious nor depressed.   Lab Results  Component Value Date   HGBA1C 8.8* 03/09/2012      Assessment & Plan:  DM.  needs increased rx. Workplace situation (3rd shift).  This makes it somewhat difficult to match insulin to needs.  It is possible that he could be managed with mealtime insulin only.  i told pt we'll try this, but i don't know if it will work.  We'll stop oral agents to minimize complexity of regimen.   Dry mouth, possibly due to DM Excessive diaphoresis.  This is sometimes due to autonomic neuropathy, but cause is uncertain.

## 2012-03-28 NOTE — Patient Instructions (Addendum)
good diet and exercise habits significanly improve the control of your diabetes.  please let me know if you wish to be referred to a dietician, or for weight-loss surgery.  high blood sugar is very risky to your health.  you should see an eye doctor every year. controlling your blood pressure and cholesterol drastically reduces the damage diabetes does to your body.  this also applies to quitting smoking.  please discuss these with your doctor.  you should take an aspirin every day, unless you have been advised by a doctor not to. check your blood sugar twice a day.  vary the time of day when you check, between before the 3 meals, and at bedtime.  also check if you have symptoms of your blood sugar being too high or too low.  please keep a record of the readings and bring it to your next appointment here.  please call us sooner if your blood sugar goes below 70, or if it stays over 200.  Stop the glipizide and metformin Change current insulin to novolog 50 units 3x a day (just before each meal, whenever that is) Please come back for a follow-up appointment in 3 weeks

## 2012-04-17 ENCOUNTER — Telehealth: Payer: Self-pay | Admitting: Internal Medicine

## 2012-04-17 MED ORDER — FUROSEMIDE 40 MG PO TABS: 40.0000 mg | ORAL_TABLET | Freq: Every day | ORAL | Status: DC

## 2012-04-17 MED ORDER — CARVEDILOL 25 MG PO TABS: ORAL_TABLET | ORAL | Status: DC

## 2012-04-17 NOTE — Telephone Encounter (Signed)
Done/SLS 

## 2012-04-17 NOTE — Telephone Encounter (Signed)
Refill- carvedilol 25mg  tab. Take one and one-half tablets by mouth twice daily. Qty 90 last fill 5.26.13  Refill- furosemide 40mg  tab. Take one tablet by mouth every day. Qty 30 last fill 4.17.54

## 2012-04-18 ENCOUNTER — Encounter: Payer: Self-pay | Admitting: Endocrinology

## 2012-04-18 ENCOUNTER — Ambulatory Visit (INDEPENDENT_AMBULATORY_CARE_PROVIDER_SITE_OTHER): Payer: Commercial Managed Care - PPO | Admitting: Endocrinology

## 2012-04-18 VITALS — BP 130/82 | HR 69 | Temp 98.4°F | Ht 74.0 in | Wt 266.0 lb

## 2012-04-18 DIAGNOSIS — E119 Type 2 diabetes mellitus without complications: Secondary | ICD-10-CM

## 2012-04-18 NOTE — Progress Notes (Signed)
Subjective:    Patient ID: Gabriel [name], male    DOB: 09/21/1951, 61 y.o.   MRN: [id number]  HPI Pt returns for f/u of insulin-requiring DM (dx'ed 1994;  complicated by CVA).  he brings a record of his cbg's which i have reviewed today.  He checks only before and after breakfast.  It varies from 86-400 ac.  Pt says this depends on the size of the previous evening's meal.  pc, all are in the low to mid-100's.  He works 3rd shift.   Past Medical History  Diagnosis Date  . Arthritis   . Depression   . History of chronic bronchitis     dx 2011  . Seasonal allergies   . Heart murmur   . Hypertension   . Diabetes mellitus, type 2   . Hyperlipidemia   . 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 Congestive heart failure     September 2011-CHF  . History of chicken pox     Past Surgical History  Procedure Date  . Knee surgery 1976, 1989    right knee torn ligament repair  . Lumbar fusion 1982    L4-L5  . Revision total knee arthroplasty 2007    right knee replacement    History   Social History  . Marital Status: Widowed    Spouse Name: N/A    Number of Children: N/A  . 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 Smoking status: Current Everyday Smoker -- 1.0 packs/day  . Smokeless tobacco: Never Used   Comment: smokes a pack per day-started in 1972  . Alcohol Use: No  . Drug Use: No  . Sexually Active: Not on file   Other Topics Concern  . Not on file   Social History Narrative   Regular exercise-noCaffeine Use-yes    Current Outpatient Prescriptions on File Prior to Visit  Medication Sig Dispense Refill  . albuterol (PROVENTIL HFA;VENTOLIN HFA) 108 (90 BASE) MCG/ACT inhaler Inhale 2 puffs into the lungs every 6 (six) hours as needed for wheezing.  1 Inhaler  0  . amitriptyline (ELAVIL) 10 MG tablet Take 1 tablet (10 mg total) by mouth at bedtime.  30 tablet  6   . amLODipine (NORVASC) 10 MG tablet Take 1 tablet (10 mg total) by mouth daily.  30 tablet  3  . aspirin 81 MG tablet Take 81 mg by mouth daily.        . carvedilol (COREG) 25 MG tablet Take one and one half tablet by mouth twice a day  90 tablet  1  . furosemide (LASIX) 40 MG tablet Take 1 tablet (40 mg total) by mouth daily.  30 tablet  2  . gemfibrozil (LOPID) 600 MG tablet Take 1 tablet (600 mg total) by mouth 2 (two) times daily before a meal.  60 tablet  3  . insulin aspart (NOVOLOG FLEXPEN) 100 UNIT/ML injection Inject 50 Units into the skin 3 (three) times daily before meals. And pen needles 3/day  60 vial  12  . lisinopril (PRINIVIL,ZESTRIL) 40 MG tablet Take 1 tablet (40 mg total) by mouth daily.  30 tablet  6  . meloxicam (MOBIC) 7.5 MG tablet Take 1 tablet (7.5 mg total) by mouth daily as needed for pain.  30 tablet  2  . Multiple Vitamin (MULTIVITAMIN) tablet Take 1 tablet by mouth daily.        Marland Kitchen  pravastatin (PRAVACHOL) 20 MG tablet Take 1 tablet (20 mg total) by mouth every evening.  30 tablet  3  . sertraline (ZOLOFT) 100 MG tablet Take one and one half tablets by mouth at bedtime  45 tablet  6  . vardenafil (LEVITRA) 10 MG tablet Take 1 tablet (10 mg total) by mouth daily as needed for erectile dysfunction.  6 tablet  2    No Known Allergies  Family History  Problem Relation Age of Onset  . Arthritis      mother/father/paternal grandparents  . Heart disease Father     pacemaker  . Hypertension Father   . Hypertension Paternal Grandfather   . Hypertension Brother   . Emotional abuse Mother   . Diabetes Brother      x 2  . Diabetes Paternal Grandmother     BP 130/82  Pulse 69  Temp 98.4 F (36.9 C) (Oral)  Ht 6\' 2"  (1.88 m)  Wt 266 lb (120.657 kg)  BMI 34.15 kg/m2  SpO2 95%    Review of Systems denies hypoglycemia.      Objective:   Physical Exam VITAL SIGNS:  See vs page GENERAL: no distress SKIN:  Insulin injection sites at the anterior abdomen and  triceps areas are normal.         Assessment & Plan:  DM.  i need more cbg info in order to adjust insulin--pt agrees

## 2012-04-18 NOTE — Patient Instructions (Addendum)
check your blood sugar twice a day.  vary the time of day when you check, between before the 3 meals, and at bedtime.  also check if you have symptoms of your blood sugar being too high or too low.  please keep a record of the readings and bring it to your next appointment here.  please call us sooner if your blood sugar goes below 70, or if it stays over 200.  Please come back for a follow-up appointment in 1 month.  Please continue the same insulin for now.

## 2012-04-18 NOTE — Progress Notes (Signed)
  Subjective:    Patient ID: Gabriel [name], male    DOB: 1950-12-20, 61 y.o.   MRN: [id number]  HPI    Review of Systems     Objective:   Physical Exam        Assessment & Plan:

## 2012-05-08 ENCOUNTER — Telehealth: Payer: Self-pay | Admitting: Internal Medicine

## 2012-05-08 MED ORDER — AMITRIPTYLINE HCL 10 MG PO TABS: 10.0000 mg | ORAL_TABLET | Freq: Every day | ORAL | Status: DC

## 2012-05-08 NOTE — Telephone Encounter (Signed)
Ok [id number] 

## 2012-05-08 NOTE — Telephone Encounter (Signed)
Rx Done/SLS 

## 2012-05-08 NOTE — Telephone Encounter (Signed)
Refill- amitriptylin 10mg  tab. Take one tablet by mouth at bedtime. Qty 30 last fill 6.24.13

## 2012-05-18 ENCOUNTER — Encounter: Payer: Self-pay | Admitting: Endocrinology

## 2012-05-18 ENCOUNTER — Ambulatory Visit (INDEPENDENT_AMBULATORY_CARE_PROVIDER_SITE_OTHER): Payer: Commercial Managed Care - PPO | Admitting: Endocrinology

## 2012-05-18 VITALS — BP 138/88 | HR 69 | Temp 98.5°F | Ht 74.0 in | Wt 265.0 lb

## 2012-05-18 DIAGNOSIS — E119 Type 2 diabetes mellitus without complications: Secondary | ICD-10-CM

## 2012-05-18 NOTE — Patient Instructions (Addendum)
check your blood sugar twice a day.  vary the time of day when you check, between before the 3 meals, and at bedtime.  also check if you have symptoms of your blood sugar being too high or too low.  please keep a record of the readings and bring it to your next appointment here.  please call us sooner if your blood sugar goes below 70, or if it stays over 200.  Please come back for a follow-up appointment in 3 months.  Please increase the insulin to 60 units with each meal.  However, take just 40 units with your meal at work.

## 2012-05-18 NOTE — Progress Notes (Signed)
Subjective:    Patient ID: Gabriel [name], male    DOB: 1951/05/15, 61 y.o.   MRN: [id number]  HPI Pt returns for f/u of insulin-requiring DM (dx'ed 1994;  complicated by CVA).  he brings a record of his cbg's which i have reviewed today.   He works 3rd shift.  It varies from 80-200's, but most are in the 100's.  It is lowest when he finishes his shift in the morning.  It is highest after he has been sleeping during the day.   Past Medical History  Diagnosis Date  . Arthritis   . Depression   . History of chronic bronchitis     dx 2011  . Seasonal allergies   . Heart murmur   . Hypertension   . Diabetes mellitus, type 2   . Hyperlipidemia   . 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 Congestive heart failure     September 2011-CHF  . History of chicken pox     Past Surgical History  Procedure Date  . Knee surgery 1976, 1989    right knee torn ligament repair  . Lumbar fusion 1982    L4-L5  . Revision total knee arthroplasty 2007    right knee replacement    History   Social History  . Marital Status: Widowed    Spouse Name: N/A    Number of Children: N/A  . 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 Smoking status: Current Everyday Smoker -- 1.0 packs/day  . Smokeless tobacco: Never Used   Comment: smokes a pack per day-started in 1972  . Alcohol Use: No  . Drug Use: No  . Sexually Active: Not on file   Other Topics Concern  . Not on file   Social History Narrative   Regular exercise-noCaffeine Use-yes    Current Outpatient Prescriptions on File Prior to Visit  Medication Sig Dispense Refill  . albuterol (PROVENTIL HFA;VENTOLIN HFA) 108 (90 BASE) MCG/ACT inhaler Inhale 2 puffs into the lungs every 6 (six) hours as needed for wheezing.  1 Inhaler  0  . amLODipine (NORVASC) 10 MG tablet Take 1 tablet (10 mg total) by mouth daily.  30 tablet  3  . aspirin  81 MG tablet Take 81 mg by mouth daily.        . carvedilol (COREG) 25 MG tablet Take one and one half tablet by mouth twice a day  90 tablet  1  . furosemide (LASIX) 40 MG tablet Take 1 tablet (40 mg total) by mouth daily.  30 tablet  2  . gemfibrozil (LOPID) 600 MG tablet Take 1 tablet (600 mg total) by mouth 2 (two) times daily before a meal.  60 tablet  3  . lisinopril (PRINIVIL,ZESTRIL) 40 MG tablet Take 1 tablet (40 mg total) by mouth daily.  30 tablet  6  . meloxicam (MOBIC) 7.5 MG tablet Take 1 tablet (7.5 mg total) by mouth daily as needed for pain.  30 tablet  2  . Multiple Vitamin (MULTIVITAMIN) tablet Take 1 tablet by mouth daily.        . pravastatin (PRAVACHOL) 20 MG tablet Take 1 tablet (20 mg total) by mouth every evening.  30 tablet  3  . sertraline (ZOLOFT) 100 MG tablet Take one and one half tablets by mouth at bedtime  45 tablet  6  .  DISCONTD: insulin aspart (NOVOLOG FLEXPEN) 100 UNIT/ML injection Inject 50 Units into the skin 3 (three) times daily before meals. And pen needles 3/day  60 vial  12  . amitriptyline (ELAVIL) 10 MG tablet Take 1 tablet (10 mg total) by mouth at bedtime.  30 tablet  11  . vardenafil (LEVITRA) 10 MG tablet Take 1 tablet (10 mg total) by mouth daily as needed for erectile dysfunction.  6 tablet  2    No Known Allergies  Family History  Problem Relation Age of Onset  . Arthritis      mother/father/paternal grandparents  . Heart disease Father     pacemaker  . Hypertension Father   . Hypertension Paternal Grandfather   . Hypertension Brother   . Emotional abuse Mother   . Diabetes Brother      x 2  . Diabetes Paternal Grandmother     BP 138/88  Pulse 69  Temp 98.5 F (36.9 C) (Oral)  Ht 6\' 2"  (1.88 m)  Wt 265 lb (120.203 kg)  BMI 34.02 kg/m2  SpO2 95%  Review of Systems denies hypoglycemia    Objective:   Physical Exam VITAL SIGNS:  See vs page GENERAL: no distress PSYCH: Alert and oriented x 3.  Does not appear anxious nor  depressed.     Assessment & Plan:  DM.  The pattern of his cbg's indicates he needs some adjustment in his therapy

## 2012-06-13 ENCOUNTER — Ambulatory Visit (INDEPENDENT_AMBULATORY_CARE_PROVIDER_SITE_OTHER): Payer: Commercial Managed Care - PPO | Admitting: Internal Medicine

## 2012-06-13 ENCOUNTER — Encounter: Payer: Self-pay | Admitting: Internal Medicine

## 2012-06-13 VITALS — BP 130/82 | HR 71 | Temp 98.2°F | Resp 18 | Wt 267.5 lb

## 2012-06-13 DIAGNOSIS — L723 Sebaceous cyst: Secondary | ICD-10-CM

## 2012-06-13 DIAGNOSIS — L729 Follicular cyst of the skin and subcutaneous tissue, unspecified: Secondary | ICD-10-CM

## 2012-06-13 DIAGNOSIS — E119 Type 2 diabetes mellitus without complications: Secondary | ICD-10-CM

## 2012-06-13 DIAGNOSIS — E785 Hyperlipidemia, unspecified: Secondary | ICD-10-CM

## 2012-06-13 DIAGNOSIS — I10 Essential (primary) hypertension: Secondary | ICD-10-CM

## 2012-06-13 LAB — LIPID PANEL
HDL: 26 mg/dL — ABNORMAL LOW (ref >39)
LDL Cholesterol: 50 mg/dL (ref 0–99)
Total CHOL/HDL Ratio: 5.5 Ratio
Triglycerides: 328 mg/dL — ABNORMAL HIGH (ref <150)
VLDL: 66 mg/dL — ABNORMAL HIGH (ref 0–40)

## 2012-06-13 LAB — HEMOGLOBIN A1C
Hgb A1c MFr Bld: 9.3 % — ABNORMAL HIGH (ref <5.7)
Mean Plasma Glucose: 220 mg/dL — ABNORMAL HIGH (ref <117)

## 2012-06-13 LAB — BASIC METABOLIC PANEL
CO2: 26 mEq/L (ref 19–32)
Chloride: 100 mEq/L (ref 96–112)
Potassium: 4.7 mEq/L (ref 3.5–5.3)
Sodium: 136 mEq/L (ref 135–145)

## 2012-06-13 NOTE — Assessment & Plan Note (Signed)
Normotensive and stable. Continue current regimen. Monitor bp as outpt and followup in clinic as scheduled.  

## 2012-06-13 NOTE — Assessment & Plan Note (Signed)
General surgery consult.

## 2012-06-13 NOTE — Progress Notes (Signed)
  Subjective:    Patient ID: Gabriel [name], male    DOB: 10/16/1951, 61 y.o.   MRN: [id number]  HPI Pt presents to clinic for followup of multiple medical problems. Weight down and BP reviewed normotensive. States blood sugars improved with endocrinology changing medication to insulin. Has cyst on back without drainage but causes intermittent pain. S/p resection in past and is interested in removal.  Past Medical History  Diagnosis Date  . Arthritis   . Depression   . History of chronic bronchitis     dx 2011  . Seasonal allergies   . Heart murmur   . Hypertension   . Diabetes mellitus, type 2   . Hyperlipidemia   . 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 Congestive heart failure     September 2011-CHF  . History of chicken pox    Past Surgical History  Procedure Date  . Knee surgery 1976, 1989    right knee torn ligament repair  . Lumbar fusion 1982    L4-L5  . Revision total knee arthroplasty 2007    right knee replacement    reports that he has been smoking.  He has never used smokeless tobacco. He reports that he does not drink alcohol or use illicit drugs. family history includes Arthritis in an unspecified family member; Diabetes in his brother and paternal grandmother; Emotional abuse in his mother; Heart disease in his father; and Hypertension in his brother, father, and paternal grandfather. No Known Allergies    Review of Systems see hpi     Objective:   Physical Exam  Physical Exam  Nursing note and vitals reviewed. Constitutional: Appears well-developed and well-nourished. No distress.  HENT:  Head: Normocephalic and atraumatic.  Right Ear: External ear normal.  Left Ear: External ear normal.  Eyes: Conjunctivae are normal. No scleral icterus.  Neck: Neck supple. Carotid bruit is not present.  Cardiovascular: Normal rate, regular rhythm and normal heart sounds.  Exam reveals no gallop and no friction rub.   No  murmur heard. Pulmonary/Chest: Effort normal and breath sounds normal. No respiratory distress. He has no wheezes. no rales.  Lymphadenopathy:    He has no cervical adenopathy.  Neurological:Alert.  Skin: Skin is warm and dry. Not diaphoretic. posterior back mid-+non draining cyst~3-4cm. Psychiatric: Has a normal mood and affect.       Assessment & Plan:

## 2012-06-13 NOTE — Assessment & Plan Note (Signed)
Obtain lipid 

## 2012-06-13 NOTE — Assessment & Plan Note (Signed)
Improving control. Obtain chem7, a1c and urine microalbumin

## 2012-06-20 ENCOUNTER — Telehealth: Payer: Self-pay | Admitting: Internal Medicine

## 2012-06-20 MED ORDER — GEMFIBROZIL 600 MG PO TABS: 600.0000 mg | ORAL_TABLET | Freq: Two times a day (BID) | ORAL | Status: DC

## 2012-06-20 MED ORDER — CARVEDILOL 25 MG PO TABS: ORAL_TABLET | ORAL | Status: DC

## 2012-06-20 NOTE — Telephone Encounter (Signed)
Rx[s] done/SLS 

## 2012-06-20 NOTE — Telephone Encounter (Signed)
Refill- carvedilol 25mg  tab. Take one and one-half tablets by mouth twice daily. Qty 90 last fill 8.1.13  Refill- gemfibrozil 600mg  tab. Take one tablet by mouth twice daily before a meal. Qty 60 last fill 8.1.13

## 2012-06-30 ENCOUNTER — Encounter: Payer: Self-pay | Admitting: *Deleted

## 2012-07-03 ENCOUNTER — Telehealth: Payer: Self-pay | Admitting: Internal Medicine

## 2012-07-03 NOTE — Telephone Encounter (Signed)
Refill-amitriptyline 10mg  tab. Qty 90 day supply. Refills 4  Refill-lisinopril 40mg  tab. Qty 90 day supply. Refills 4  Refill- furosemide 40mg  tab. Qty 90 day supply. Refills 4

## 2012-07-03 NOTE — Telephone Encounter (Signed)
Furosemide & Lisinopril Rx to [facility]/SLS Amitriptyline Rx done to local [facility] 07.22.13 #[id number]; [facility] with contact name & number for pt return call to clarify if he would like new Rx to [facility] with D/C on Script px to [facility]/SLS

## 2012-07-04 ENCOUNTER — Telehealth: Payer: Self-pay | Admitting: Internal Medicine

## 2012-07-04 MED ORDER — FUROSEMIDE 40 MG PO TABS: 40.0000 mg | ORAL_TABLET | Freq: Every day | ORAL | Status: DC

## 2012-07-04 MED ORDER — AMITRIPTYLINE HCL 10 MG PO TABS: 10.0000 mg | ORAL_TABLET | Freq: Every day | ORAL | Status: DC

## 2012-07-04 MED ORDER — LISINOPRIL 40 MG PO TABS: 40.0000 mg | ORAL_TABLET | Freq: Every day | ORAL | Status: DC

## 2012-07-04 NOTE — Telephone Encounter (Signed)
Opened in error

## 2012-07-04 NOTE — Telephone Encounter (Signed)
Patient returned phone call. He states that he is using [facility], not [facility] now for those four medications. Best # [phone]

## 2012-07-04 NOTE — Telephone Encounter (Signed)
Rx[s] done; [facility] with contact name & number to inform patient/SLS

## 2012-07-12 ENCOUNTER — Encounter: Payer: Self-pay | Admitting: Family

## 2012-07-12 ENCOUNTER — Ambulatory Visit (HOSPITAL_BASED_OUTPATIENT_CLINIC_OR_DEPARTMENT_OTHER): Admission: RE | Admit: 2012-07-12 | Payer: Commercial Managed Care - PPO | Source: Ambulatory Visit

## 2012-07-12 ENCOUNTER — Ambulatory Visit (INDEPENDENT_AMBULATORY_CARE_PROVIDER_SITE_OTHER): Payer: Commercial Managed Care - PPO | Admitting: Family

## 2012-07-12 ENCOUNTER — Ambulatory Visit (HOSPITAL_BASED_OUTPATIENT_CLINIC_OR_DEPARTMENT_OTHER)
Admission: RE | Admit: 2012-07-12 | Discharge: 2012-07-12 | Disposition: A | Discharge status: Home / Self Care | Payer: Commercial Managed Care - PPO | Source: Ambulatory Visit | Attending: Family | Admitting: Family

## 2012-07-12 VITALS — BP 158/90 | HR 76 | Temp 98.2°F | Resp 16 | Wt 263.0 lb

## 2012-07-12 DIAGNOSIS — S46909A Unspecified injury of unspecified muscle, fascia and tendon at shoulder and upper arm level, unspecified arm, initial encounter: Secondary | ICD-10-CM | POA: Insufficient documentation

## 2012-07-12 DIAGNOSIS — Z23 Encounter for immunization: Secondary | ICD-10-CM

## 2012-07-12 DIAGNOSIS — S79919A Unspecified injury of unspecified hip, initial encounter: Secondary | ICD-10-CM | POA: Insufficient documentation

## 2012-07-12 DIAGNOSIS — S4980XA Other specified injuries of shoulder and upper arm, unspecified arm, initial encounter: Secondary | ICD-10-CM | POA: Insufficient documentation

## 2012-07-12 DIAGNOSIS — M25559 Pain in unspecified hip: Secondary | ICD-10-CM

## 2012-07-12 DIAGNOSIS — W19XXXA Unspecified fall, initial encounter: Secondary | ICD-10-CM

## 2012-07-12 DIAGNOSIS — W108XXA Fall (on) (from) other stairs and steps, initial encounter: Secondary | ICD-10-CM | POA: Insufficient documentation

## 2012-07-12 DIAGNOSIS — M25519 Pain in unspecified shoulder: Secondary | ICD-10-CM

## 2012-07-12 NOTE — Patient Instructions (Addendum)
Please complete your x-rays on the first floor.  We will contact you with your results.

## 2012-07-12 NOTE — Progress Notes (Signed)
Subjective:    Patient ID: Gabriel [name], male    DOB: Aug 06, 1951, 61 y.o.   MRN: [id number]  HPI  Gabriel [name] is a 61 yr old male who presents today following a fall.  He reports chief complaint of left shoulder and left hip pain.  He reports that his right foot slipped on the step and he fell onto his left side. He fell down 10 steps. He hit his left hip, left elbow. Denies LOC or head trauma.  He reports pain in the left shoulder and left hip today.  Also has "blood blister" of the left distal ring finger. Reports that he used his lancet this AM in that finger without any drainage of blood.  Reports swelling of this finger is stable since he woke up this AM. He is able to ambulate, but it hurts to sit.   Review of Systems See HPI  Past Medical History  Diagnosis Date  . Arthritis   . Depression   . History of chronic bronchitis     dx 2011  . Seasonal allergies   . Heart murmur   . Hypertension   . Diabetes mellitus, type 2   . Hyperlipidemia   . 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 Congestive heart failure     September 2011-CHF  . History of chicken pox     History   Social History  . Marital Status: Widowed    Spouse Name: N/A    Number of Children: N/A  . 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 Smoking status: Current Every Day Smoker -- 1.0 packs/day  . Smokeless tobacco: Never Used   Comment: smokes a pack per day-started in 1972  . Alcohol Use: No  . Drug Use: No  . Sexually Active: Not on file   Other Topics Concern  . Not on file   Social History Narrative   Regular exercise-noCaffeine Use-yes    Past Surgical History  Procedure Date  . Knee surgery 1976, 1989    right knee torn ligament repair  . Lumbar fusion 1982    L4-L5  . Revision total knee arthroplasty 2007    right knee replacement    Family History  Problem Relation Age  of Onset  . Arthritis      mother/father/paternal grandparents  . Heart disease Father     pacemaker  . Hypertension Father   . Hypertension Paternal Grandfather   . Hypertension Brother   . Emotional abuse Mother   . Diabetes Brother      x 2  . Diabetes Paternal Grandmother     No Known Allergies  Current Outpatient Prescriptions on File Prior to Visit  Medication Sig Dispense Refill  . albuterol (PROVENTIL HFA;VENTOLIN HFA) 108 (90 BASE) MCG/ACT inhaler Inhale 2 puffs into the lungs every 6 (six) hours as needed for wheezing.  1 Inhaler  0  . amitriptyline (ELAVIL) 10 MG tablet Take 1 tablet (10 mg total) by mouth at bedtime.  90 tablet  1  . amLODipine (NORVASC) 10 MG tablet Take 1 tablet (10 mg total) by mouth daily.  30 tablet  3  . aspirin 81 MG tablet Take 81 mg by mouth daily.        . carvedilol (COREG) 25 MG tablet Take one and one half tablet by mouth twice a day  90  tablet  3  . furosemide (LASIX) 40 MG tablet Take 1 tablet (40 mg total) by mouth daily.  90 tablet  1  . gemfibrozil (LOPID) 600 MG tablet Take 1 tablet (600 mg total) by mouth 2 (two) times daily before a meal.  60 tablet  3  . insulin aspart (NOVOLOG) 100 UNIT/ML injection Inject 60 Units into the skin 3 (three) times daily before meals. And pen needles 3/day      . lisinopril (PRINIVIL,ZESTRIL) 40 MG tablet Take 1 tablet (40 mg total) by mouth daily.  90 tablet  1  . meloxicam (MOBIC) 7.5 MG tablet Take 1 tablet (7.5 mg total) by mouth daily as needed for pain.  30 tablet  2  . Multiple Vitamin (MULTIVITAMIN) tablet Take 1 tablet by mouth daily.        . nepafenac (NEVANAC) 0.1 % ophthalmic suspension Place 3 drops into the left eye 3 (three) times daily.      . pravastatin (PRAVACHOL) 20 MG tablet Take 1 tablet (20 mg total) by mouth every evening.  30 tablet  3  . sertraline (ZOLOFT) 100 MG tablet Take one and one half tablets by mouth at bedtime  45 tablet  6  . vardenafil (LEVITRA) 10 MG tablet Take 10  mg by mouth daily as needed.        BP 158/90  Pulse 76  Temp 98.2 F (36.8 C) (Oral)  Resp 16  Wt 263 lb (119.296 kg)  SpO2 97%       Objective:   Physical Exam  Constitutional: He is oriented to person, place, and time. He appears well-developed and well-nourished.  Cardiovascular: Normal rate and regular rhythm.   No murmur heard. Pulmonary/Chest: Effort normal and breath sounds normal. No respiratory distress. He has no wheezes. He has no rales. He exhibits no tenderness.  Musculoskeletal:       + hematoma left ring finger, plantar surface.  Good cap refill to finger tip.  Mild tenderness.   Bilateral LE strength is 5/5 Full ROM of the left hip, increased pain with adduction.  Full ROM of left shoulder.  Some discomfort noted with ROM. No significant left shoulder tenderness to palpation  Neurological: He is alert and oriented to person, place, and time.  Skin: Skin is warm and dry.  Psychiatric: He has a normal mood and affect. His behavior is normal. Judgment and thought content normal.          Assessment & Plan:

## 2012-07-12 NOTE — Assessment & Plan Note (Addendum)
61 yr old male s/p accidental fall. Obtain X-rays of left hip/pelvis, L shoulder.  Need to rule out fracture.  Recommended that he monitor hematoma of the left ring finger and contact us if increased swelling.  Pt verbalizes understanding. Recommended tylenol prn pain.

## 2012-07-13 ENCOUNTER — Telehealth: Payer: Self-pay | Admitting: Internal Medicine

## 2012-07-13 ENCOUNTER — Other Ambulatory Visit: Payer: Self-pay | Admitting: *Deleted

## 2012-07-13 MED ORDER — CARVEDILOL 25 MG PO TABS: ORAL_TABLET | ORAL | Status: DC

## 2012-07-13 NOTE — Telephone Encounter (Signed)
Patient states that he needs a doctors excuse note for work for the dates 9/25 and 9/26. He will pickup note when ready

## 2012-07-14 ENCOUNTER — Telehealth: Payer: Self-pay | Admitting: Internal Medicine

## 2012-07-14 ENCOUNTER — Encounter: Payer: Self-pay | Admitting: Family

## 2012-07-14 NOTE — Telephone Encounter (Signed)
Letter printed and given to patient at 2:30 pm 07/14/2012

## 2012-07-14 NOTE — Telephone Encounter (Signed)
Patient called again regarding this stating that he cannot go back to work until he receives this note.

## 2012-07-17 ENCOUNTER — Telehealth: Payer: Self-pay | Admitting: Internal Medicine

## 2012-07-17 MED ORDER — AMLODIPINE BESYLATE 10 MG PO TABS: 10.0000 mg | ORAL_TABLET | Freq: Every day | ORAL | Status: DC

## 2012-07-17 NOTE — Telephone Encounter (Signed)
Refill- amlodipine 10mg  tab. Take one tablet by mouth every day. Qty 30 last fill 8.23.13

## 2012-07-17 NOTE — Telephone Encounter (Signed)
Done/SLS 

## 2012-07-31 ENCOUNTER — Telehealth: Payer: Self-pay | Admitting: Internal Medicine

## 2012-07-31 MED ORDER — PRAVASTATIN SODIUM 20 MG PO TABS: 20.0000 mg | ORAL_TABLET | Freq: Every evening | ORAL | Status: DC

## 2012-07-31 NOTE — Telephone Encounter (Signed)
Done

## 2012-08-16 ENCOUNTER — Telehealth: Payer: Self-pay | Admitting: Internal Medicine

## 2012-08-16 MED ORDER — SERTRALINE HCL 100 MG PO TABS: ORAL_TABLET | ORAL | Status: DC

## 2012-08-16 NOTE — Telephone Encounter (Signed)
[id number] 

## 2012-08-16 NOTE — Telephone Encounter (Signed)
Refill- sertraline 100mg  tab. Take one and one-half tablets by mouth at bedtime. Qty 45 last fill 9.23.13

## 2012-08-16 NOTE — Telephone Encounter (Signed)
Rx to pharmacy/[facility] 

## 2012-08-22 ENCOUNTER — Ambulatory Visit: Payer: Commercial Managed Care - PPO | Admitting: Endocrinology

## 2012-10-04 ENCOUNTER — Ambulatory Visit: Payer: Commercial Managed Care - PPO | Admitting: Internal Medicine

## 2012-10-12 ENCOUNTER — Ambulatory Visit: Payer: Commercial Managed Care - PPO | Admitting: Internal Medicine

## 2012-10-24 ENCOUNTER — Ambulatory Visit: Payer: Commercial Managed Care - PPO | Admitting: Internal Medicine

## 2012-11-03 ENCOUNTER — Encounter: Payer: Self-pay | Admitting: Internal Medicine

## 2012-11-03 ENCOUNTER — Ambulatory Visit (INDEPENDENT_AMBULATORY_CARE_PROVIDER_SITE_OTHER): Payer: Commercial Managed Care - PPO | Admitting: Internal Medicine

## 2012-11-03 VITALS — BP 152/88 | HR 71 | Temp 97.5°F | Resp 16

## 2012-11-03 DIAGNOSIS — R9431 Abnormal electrocardiogram [ECG] [EKG]: Secondary | ICD-10-CM

## 2012-11-03 DIAGNOSIS — Z79899 Other long term (current) drug therapy: Secondary | ICD-10-CM

## 2012-11-03 DIAGNOSIS — R002 Palpitations: Secondary | ICD-10-CM

## 2012-11-03 DIAGNOSIS — E119 Type 2 diabetes mellitus without complications: Secondary | ICD-10-CM

## 2012-11-03 DIAGNOSIS — I10 Essential (primary) hypertension: Secondary | ICD-10-CM

## 2012-11-03 DIAGNOSIS — E785 Hyperlipidemia, unspecified: Secondary | ICD-10-CM

## 2012-11-03 NOTE — Patient Instructions (Signed)
Please return on Monday for fasting labs Cbc, chem7-v58.69 and lipid/lft-272.4

## 2012-11-07 ENCOUNTER — Other Ambulatory Visit: Payer: Self-pay | Admitting: Family Medicine

## 2012-11-07 ENCOUNTER — Telehealth: Payer: Self-pay | Admitting: Internal Medicine

## 2012-11-07 LAB — CBC WITH DIFFERENTIAL/PLATELET
Basophils Absolute: 0.1 10*3/uL (ref 0.0–0.1)
Basophils Relative: 1 % (ref 0–1)
Lymphocytes Relative: 15 % (ref 12–46)
MCHC: 34.9 g/dL (ref 30.0–36.0)
Neutro Abs: 6 10*3/uL (ref 1.7–7.7)
Neutrophils Relative %: 76 % (ref 43–77)
Platelets: 179 10*3/uL (ref 150–400)
RDW: 13.7 % (ref 11.5–15.5)
WBC: 7.8 10*3/uL (ref 4.0–10.5)

## 2012-11-07 LAB — BASIC METABOLIC PANEL
BUN: 21 mg/dL (ref 6–23)
Chloride: 98 mEq/L (ref 96–112)
Creat: 1.05 mg/dL (ref 0.50–1.35)
Potassium: 4.7 mEq/L (ref 3.5–5.3)

## 2012-11-07 LAB — HEPATIC FUNCTION PANEL
ALT: 13 U/L (ref 0–53)
Albumin: 4.6 g/dL (ref 3.5–5.2)
Alkaline Phosphatase: 79 U/L (ref 39–117)
Indirect Bilirubin: 0.3 mg/dL (ref 0.0–0.9)
Total Protein: 7 g/dL (ref 6.0–8.3)

## 2012-11-07 LAB — LIPID PANEL
Cholesterol: 134 mg/dL (ref 0–200)
HDL: 25 mg/dL — ABNORMAL LOW (ref >39)
LDL Cholesterol: 39 mg/dL (ref 0–99)
Triglycerides: 349 mg/dL — ABNORMAL HIGH (ref <150)
VLDL: 70 mg/dL — ABNORMAL HIGH (ref 0–40)

## 2012-11-07 MED ORDER — GEMFIBROZIL 600 MG PO TABS: 600.0000 mg | ORAL_TABLET | Freq: Two times a day (BID) | ORAL | Status: DC

## 2012-11-07 NOTE — Telephone Encounter (Signed)
Rx to pharmacy/[facility] 

## 2012-11-07 NOTE — Telephone Encounter (Signed)
Refill- gemfibrozil 600mg  tab. Take one tablet by mouth twice daily before a meal. Qty 60 last fill 12.20.13

## 2012-11-08 DIAGNOSIS — R002 Palpitations: Secondary | ICD-10-CM | POA: Insufficient documentation

## 2012-11-08 DIAGNOSIS — R9431 Abnormal electrocardiogram [ECG] [EKG]: Secondary | ICD-10-CM | POA: Insufficient documentation

## 2012-11-08 NOTE — Assessment & Plan Note (Signed)
Asx. Multiple risk factors for cad. Schedule nuclear stress test.

## 2012-11-08 NOTE — Assessment & Plan Note (Signed)
Currently asx. EKG obtain with nsr 68. LAD and nl intervals. t wave inversions in I and AVL. Schedule event monitor.

## 2012-11-08 NOTE — Progress Notes (Signed)
  Subjective:    Patient ID: Gabriel [name], male    DOB: Jan 15, 1951, 62 y.o.   MRN: [id number]  HPI Pt presents to clinic for followup of multiple medical problems. Notes intermittent palpitations described as fluttering with duration being secs. Denies cp, chest pressure, dyspnea or syncope. BP elevated but states tired from work. Home monitoring typically low 130's/70's.   Past Medical History  Diagnosis Date  . Arthritis   . Depression   . History of chronic bronchitis     dx 2011  . Seasonal allergies   . Heart murmur   . Hypertension   . Diabetes mellitus, type 2   . Hyperlipidemia   . 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 Congestive heart failure     September 2011-CHF  . History of chicken pox    Past Surgical History  Procedure Date  . Knee surgery 1976, 1989    right knee torn ligament repair  . Lumbar fusion 1982    L4-L5  . Revision total knee arthroplasty 2007    right knee replacement    reports that he has been smoking.  He has never used smokeless tobacco. He reports that he does not drink alcohol or use illicit drugs. family history includes Arthritis in an unspecified family member; Diabetes in his brother and paternal grandmother; Emotional abuse in his mother; Heart disease in his father; and Hypertension in his brother, father, and paternal grandfather. No Known Allergies    Review of Systems see hpi     Objective:   Physical Exam  Physical Exam  Nursing note and vitals reviewed. Constitutional: Appears well-developed and well-nourished. No distress.  HENT:  Head: Normocephalic and atraumatic.  Right Ear: External ear normal.  Left Ear: External ear normal.  Eyes: Conjunctivae are normal. No scleral icterus.  Neck: Neck supple. Carotid bruit is not present.  Cardiovascular: Normal rate, regular rhythm and normal heart sounds.  Exam reveals no gallop and no friction rub.   No murmur  heard. Pulmonary/Chest: Effort normal and breath sounds normal. No respiratory distress. He has no wheezes. no rales.  Lymphadenopathy:    He has no cervical adenopathy.  Neurological:Alert.  Skin: Skin is warm and dry. Not diaphoretic.  Psychiatric: Has a normal mood and affect.        Assessment & Plan:

## 2012-11-08 NOTE — Assessment & Plan Note (Signed)
Obtain lipid/lft. 

## 2012-11-08 NOTE — Assessment & Plan Note (Signed)
Isolated elevation. Obtain cbc and chem7

## 2012-11-08 NOTE — Assessment & Plan Note (Signed)
Followed by endocrinology with f/u next week.

## 2012-11-09 ENCOUNTER — Encounter (HOSPITAL_COMMUNITY): Payer: Commercial Managed Care - PPO

## 2012-11-15 ENCOUNTER — Encounter (HOSPITAL_COMMUNITY): Payer: Commercial Managed Care - PPO

## 2012-11-27 ENCOUNTER — Ambulatory Visit (INDEPENDENT_AMBULATORY_CARE_PROVIDER_SITE_OTHER): Payer: Commercial Managed Care - PPO | Admitting: Family Medicine

## 2012-11-27 ENCOUNTER — Encounter: Payer: Self-pay | Admitting: Family Medicine

## 2012-11-27 VITALS — BP 146/86 | HR 70 | Temp 97.4°F | Ht 74.0 in | Wt 261.0 lb

## 2012-11-27 DIAGNOSIS — E669 Obesity, unspecified: Secondary | ICD-10-CM | POA: Insufficient documentation

## 2012-11-27 DIAGNOSIS — E119 Type 2 diabetes mellitus without complications: Secondary | ICD-10-CM

## 2012-11-27 DIAGNOSIS — I10 Essential (primary) hypertension: Secondary | ICD-10-CM

## 2012-11-27 DIAGNOSIS — Z720 Tobacco use: Secondary | ICD-10-CM

## 2012-11-27 DIAGNOSIS — E785 Hyperlipidemia, unspecified: Secondary | ICD-10-CM

## 2012-11-27 DIAGNOSIS — F17200 Nicotine dependence, unspecified, uncomplicated: Secondary | ICD-10-CM

## 2012-11-27 HISTORY — DX: Tobacco use: Z72.0

## 2012-11-27 MED ORDER — METFORMIN HCL 1000 MG PO TABS: 1000.0000 mg | ORAL_TABLET | Freq: Two times a day (BID) | ORAL | Status: DC

## 2012-11-27 MED ORDER — KRILL OIL PO CAPS: ORAL_CAPSULE | ORAL | Status: AC

## 2012-11-27 NOTE — Progress Notes (Signed)
Patient ID: Gabriel [name], male   DOB: [birth year], 62 y.o.   MRN: [id number] Gabriel [name] [id number] 25-Mar-1951 11/27/2012      Progress Note-Follow Up  Subjective  Chief Complaint  Chief Complaint  Patient presents with  . Follow-up    HPI  This is a 62 year old Caucasian male who is in today for evaluation of his blood sugars. He works as a Electrical engineer running a night shift. So he sleeps it off hours. Is noting that he often has trouble with low blood sugars feeling shaky after his lunch. On review he eats only some cinnamon apple a meal in the morning. Encouraged him to add protein. He previously had been on metformin this was stopped and unfortunately his hemoglobin A1c is increased.  Past Medical History  Diagnosis Date  . Arthritis   . Depression   . History of chronic bronchitis     dx 2011  . Seasonal allergies   . Heart murmur   . Hypertension   . Diabetes mellitus, type 2   . Hyperlipidemia   . 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 Congestive heart failure     September 2011-CHF  . History of chicken pox   . Obesity, unspecified 11/27/2012  . Tobacco abuse 11/27/2012    Down from 3 ppd to 1/2 ppd    Past Surgical History  Procedure Laterality Date  . Knee surgery  1976, 1989    right knee torn ligament repair  . Lumbar fusion  1982    L4-L5  . Revision total knee arthroplasty  2007    right knee replacement    Family History  Problem Relation Age of Onset  . Arthritis      mother/father/paternal grandparents  . Heart disease Father     pacemaker  . Hypertension Father   . Hypertension Paternal Grandfather   . Hypertension Brother   . Emotional abuse Mother   . Diabetes Brother      x 2  . Diabetes Paternal Grandmother     History   Social History  . Marital Status: Widowed    Spouse Name: N/A    Number of Children: N/A  . 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   Social History Main Topics  . Smoking status: Current Every Day Smoker -- 1.00 packs/day  . Smokeless tobacco: Never Used     Comment: smokes a pack per day-started in 1972  . Alcohol Use: No  . Drug Use: No  . Sexually Active: Not on file   Other Topics Concern  . Not on file   Social History Narrative   Regular exercise-no   Caffeine Use-yes          Current Outpatient Prescriptions on File Prior to Visit  Medication Sig Dispense Refill  . albuterol (PROVENTIL HFA;VENTOLIN HFA) 108 (90 BASE) MCG/ACT inhaler Inhale 2 puffs into the lungs every 6 (six) hours as needed for wheezing.  1 Inhaler  0  . amitriptyline (ELAVIL) 10 MG tablet Take 1 tablet (10 mg total) by mouth at bedtime.  90 tablet  1  . amLODipine (NORVASC) 10 MG tablet Take 1 tablet (10 mg total) by mouth daily.  90 tablet  1  . aspirin 81 MG tablet Take 81 mg by mouth daily.        . carvedilol (COREG) 25 MG tablet Take one and one half tablet  by mouth twice a day  270 tablet  1  . furosemide (LASIX) 40 MG tablet Take 1 tablet (40 mg total) by mouth daily.  90 tablet  1  . gemfibrozil (LOPID) 600 MG tablet Take 1 tablet (600 mg total) by mouth 2 (two) times daily before a meal.  60 tablet  3  . insulin aspart (NOVOLOG) 100 UNIT/ML injection Inject 60 Units into the skin 3 (three) times daily before meals. And pen needles 3/day      . lisinopril (PRINIVIL,ZESTRIL) 40 MG tablet Take 1 tablet (40 mg total) by mouth daily.  90 tablet  1  . meloxicam (MOBIC) 7.5 MG tablet Take 1 tablet (7.5 mg total) by mouth daily as needed for pain.  30 tablet  2  . Multiple Vitamin (MULTIVITAMIN) tablet Take 1 tablet by mouth daily.        . nepafenac (NEVANAC) 0.1 % ophthalmic suspension Place 3 drops into the left eye 3 (three) times daily.      . pravastatin (PRAVACHOL) 20 MG tablet Take 1 tablet (20 mg total) by mouth every evening.  30 tablet  3  . sertraline (ZOLOFT) 100 MG tablet Take one and one half tablets by  mouth at bedtime  45 tablet  6  . vardenafil (LEVITRA) 10 MG tablet Take 10 mg by mouth daily as needed.       No current facility-administered medications on file prior to visit.    No Known Allergies  Review of Systems  Review of Systems  Constitutional: Negative for fever and malaise/fatigue.  HENT: Negative for congestion.   Eyes: Negative for discharge.  Respiratory: Negative for shortness of breath.   Cardiovascular: Negative for chest pain, palpitations and leg swelling.  Gastrointestinal: Negative for nausea, abdominal pain and diarrhea.  Genitourinary: Negative for dysuria.  Musculoskeletal: Negative for falls.  Skin: Negative for rash.  Neurological: Negative for loss of consciousness and headaches.  Endo/Heme/Allergies: Negative for polydipsia.  Psychiatric/Behavioral: Negative for depression and suicidal ideas. The patient is not nervous/anxious and does not have insomnia.     Objective  BP 146/86  Pulse 70  Temp(Src) 97.4 F (36.3 C) (Temporal)  Ht 6\' 2"  (1.88 m)  Wt 261 lb 0.6 oz (118.407 kg)  BMI 33.5 kg/m2  SpO2 99%  Physical Exam  Physical Exam  Constitutional: He is oriented to person, place, and time and well-developed, well-nourished, and in no distress. No distress.  HENT:  Head: Normocephalic and atraumatic.  Eyes: Conjunctivae are normal.  Neck: Neck supple. No thyromegaly present.  Cardiovascular: Normal rate, regular rhythm and normal heart sounds.   No murmur heard. Pulmonary/Chest: Effort normal and breath sounds normal. No respiratory distress.  Abdominal: He exhibits no distension and no mass. There is no tenderness.  Musculoskeletal: He exhibits no edema.  Neurological: He is alert and oriented to person, place, and time.  Skin: Skin is warm.  Psychiatric: Memory, affect and judgment normal.    No results found for this basename: TSH   Lab Results  Component Value Date   WBC 7.8 11/07/2012   HGB 15.9 11/07/2012   HCT 45.6  11/07/2012   MCV 91.0 11/07/2012   PLT 179 11/07/2012   Lab Results  Component Value Date   CREATININE 1.05 11/07/2012   BUN 21 11/07/2012   NA 132* 11/07/2012   K 4.7 11/07/2012   CL 98 11/07/2012   CO2 27 11/07/2012   Lab Results  Component Value Date   ALT 13 11/07/2012  AST 12 11/07/2012   ALKPHOS 79 11/07/2012   BILITOT 0.4 11/07/2012   Lab Results  Component Value Date   CHOL 134 11/07/2012   Lab Results  Component Value Date   HDL 25* 11/07/2012   Lab Results  Component Value Date   LDLCALC 39 11/07/2012   Lab Results  Component Value Date   TRIG 349* 11/07/2012   Lab Results  Component Value Date   CHOLHDL 5.4 11/07/2012     Assessment & Plan  DM (diabetes mellitus) Poor control, will add back Metformin 1000 mg po bid, reviewed diet, eating too many carbs at early meals and no proteins, encouraged reversal of this to control for hi and low sugars.   HTN (hypertension) Improved on recheck. He is just coming for a night shift and did not take her medications today. No changes to meds today, encouraged to take meds prior to next visit  Other and unspecified hyperlipidemia We'll continue pravastatin same dose and add Krill oil daily. Try and get him to decrease carbs more fully and we check an FLP in 3 months time.  Obesity, unspecified Has begun walking more work and is having slow steady weight loss. Encouraged him to watch his carbohydrates little more strictly and we will continue to monitor.  Tobacco abuse Reviewed strategies for cutting back further. We'll reevaluate at next visit.

## 2012-11-27 NOTE — Assessment & Plan Note (Signed)
Has begun walking more work and is having slow steady weight loss. Encouraged him to watch his carbohydrates little more strictly and we will continue to monitor.

## 2012-11-27 NOTE — Assessment & Plan Note (Signed)
Poor control, will add back Metformin 1000 mg po bid, reviewed diet, eating too many carbs at early meals and no proteins, encouraged reversal of this to control for hi and low sugars.

## 2012-11-27 NOTE — Assessment & Plan Note (Signed)
We'll continue pravastatin same dose and add Krill oil daily. Try and get him to decrease carbs more fully and we check an FLP in 3 months time.

## 2012-11-27 NOTE — Assessment & Plan Note (Signed)
Reviewed strategies for cutting back further. We'll reevaluate at next visit.

## 2012-11-27 NOTE — Assessment & Plan Note (Signed)
Improved on recheck. He is just coming for a night shift and did not take her medications today. No changes to meds today, encouraged to take meds prior to next visit

## 2012-11-27 NOTE — Patient Instructions (Addendum)
Start Krill oil try prices at [employer], [employer] [facility] prior to next visit, hgba1c, lipid, renal, cbc, tsh, hepatic, psa  Diabetes Meal Planning Guide The diabetes meal planning guide is a tool to help you plan your meals and snacks. It is important for people with diabetes to manage their blood glucose (sugar) levels. Choosing the right foods and the right amounts throughout your day will help control your blood glucose. Eating right can even help you improve your blood pressure and reach or maintain a healthy weight. CARBOHYDRATE COUNTING MADE EASY When you eat carbohydrates, they turn to sugar. This raises your blood glucose level. Counting carbohydrates can help you control this level so you feel better. When you plan your meals by counting carbohydrates, you can have more flexibility in what you eat and balance your medicine with your food intake. Carbohydrate counting simply means adding up the total amount of carbohydrate grams in your meals and snacks. Try to eat about the same amount at each meal. Foods with carbohydrates are listed below. Each portion below is 1 carbohydrate serving or 15 grams of carbohydrates. Ask your dietician how many grams of carbohydrates you should eat at each meal or snack. Grains and Starches  1 slice bread.   English muffin or hotdog/hamburger bun.   cup cold cereal (unsweetened).   cup cooked pasta or rice.   cup starchy vegetables (corn, potatoes, peas, beans, winter squash).  1 tortilla (6 inches).   bagel.  1 waffle or pancake (size of a CD).   cup cooked cereal.  4 to 6 small crackers. *Whole grain is recommended. Fruit  1 cup fresh unsweetened berries, melon, papaya, pineapple.  1 small fresh fruit.   banana or mango.   cup fruit juice (4 oz unsweetened).   cup canned fruit in natural juice or water.  2 tbs dried fruit.  12 to 15 grapes or cherries. Milk and Yogurt  1 cup fat-free or 1% milk.  1 cup soy  milk.  6 oz light yogurt with sugar-free sweetener.  6 oz low-fat soy yogurt.  6 oz plain yogurt. Vegetables  1 cup raw or  cup cooked is counted as 0 carbohydrates or a "free" food.  If you eat 3 or more servings at 1 meal, count them as 1 carbohydrate serving. Other Carbohydrates   oz chips or pretzels.   cup ice cream or frozen yogurt.   cup sherbet or sorbet.  2 inch square cake, no frosting.  1 tbs honey, sugar, jam, jelly, or syrup.  2 small cookies.  3 squares of graham crackers.  3 cups popcorn.  6 crackers.  1 cup broth-based soup.  Count 1 cup casserole or other mixed foods as 2 carbohydrate servings.  Foods with less than 20 calories in a serving may be counted as 0 carbohydrates or a "free" food. You may want to purchase a book or computer software that lists the carbohydrate gram counts of different foods. In addition, the nutrition facts panel on the labels of the foods you eat are a good source of this information. The label will tell you how big the serving size is and the total number of carbohydrate grams you will be eating per serving. Divide this number by 15 to obtain the number of carbohydrate servings in a portion. Remember, 1 carbohydrate serving equals 15 grams of carbohydrate. SERVING SIZES Measuring foods and serving sizes helps you make sure you are getting the right amount of food. The list below  tells how big or small some common serving sizes are.  1 oz.........4 stacked dice.  3 oz........Marland KitchenDeck of cards.  1 tsp.......Marland KitchenTip of little finger.  1 tbs......Marland KitchenMarland KitchenThumb.  2 tbs.......Marland KitchenGolf ball.   cup......Marland KitchenHalf of a fist.  1 cup.......Marland KitchenA fist. SAMPLE DIABETES MEAL PLAN Below is a sample meal plan that includes foods from the grain and starches, dairy, vegetable, fruit, and meat groups. A dietician can individualize a meal plan to fit your calorie needs and tell you the number of servings needed from each food group. However,  controlling the total amount of carbohydrates in your meal or snack is more important than making sure you include all of the food groups at every meal. You may interchange carbohydrate containing foods (dairy, starches, and fruits). The meal plan below is an example of a 2000 calorie diet using carbohydrate counting. This meal plan has 17 carbohydrate servings. Breakfast  1 cup oatmeal (2 carb servings).   cup light yogurt (1 carb serving).  1 cup blueberries (1 carb serving).   cup almonds. Snack  1 large apple (2 carb servings).  1 low-fat string cheese stick. Lunch  Chicken breast salad.  1 cup spinach.   cup chopped tomatoes.  2 oz chicken breast, sliced.  2 tbs low-fat [place] dressing.  12 whole-wheat crackers (2 carb servings).  12 to 15 grapes (1 carb serving).  1 cup low-fat milk (1 carb serving). Snack  1 cup carrots.   cup hummus (1 carb serving). Dinner  3 oz broiled salmon.  1 cup brown rice (3 carb servings). Snack  1  cups steamed broccoli (1 carb serving) drizzled with 1 tsp olive oil and lemon juice.  1 cup light pudding (2 carb servings). DIABETES MEAL PLANNING WORKSHEET Your dietician can use this worksheet to help you decide how many servings of foods and what types of foods are right for you.  BREAKFAST Food Group and Servings / Carb Servings Grain/Starches __________________________________ Dairy __________________________________________ Vegetable ______________________________________ Fruit ___________________________________________ Meat __________________________________________ Fat ____________________________________________ LUNCH Food Group and Servings / Carb Servings Grain/Starches ___________________________________ Dairy ___________________________________________ Fruit ____________________________________________ Meat ___________________________________________ Fat  _____________________________________________ Laural Golden Food Group and Servings / Carb Servings Grain/Starches ___________________________________ Dairy ___________________________________________ Fruit ____________________________________________ Meat ___________________________________________ Fat _____________________________________________ SNACKS Food Group and Servings / Carb Servings Grain/Starches ___________________________________ Dairy ___________________________________________ Vegetable _______________________________________ Fruit ____________________________________________ Meat ___________________________________________ Fat _____________________________________________ DAILY TOTALS Starches _________________________ Vegetable ________________________ Fruit ____________________________ Dairy ____________________________ Meat ____________________________ Fat ______________________________ Document Released: 07/01/2005 Document Revised: 12/27/2011 Document Reviewed: 05/12/2009 [employer] Patient Information 2013 [place], [place].

## 2012-12-14 ENCOUNTER — Other Ambulatory Visit: Payer: Self-pay | Admitting: Internal Medicine

## 2012-12-15 ENCOUNTER — Other Ambulatory Visit: Payer: Self-pay | Admitting: Internal Medicine

## 2012-12-19 ENCOUNTER — Telehealth: Payer: Self-pay | Admitting: Family Medicine

## 2012-12-19 MED ORDER — PRAVASTATIN SODIUM 20 MG PO TABS: 20.0000 mg | ORAL_TABLET | Freq: Every evening | ORAL | Status: DC

## 2012-12-19 NOTE — Telephone Encounter (Signed)
Refill- pravastatin 20mg  tab. Take one tablet by mouth in the evening. Qty 30 last fill 1.21.14

## 2012-12-25 ENCOUNTER — Telehealth: Payer: Self-pay | Admitting: Family Medicine

## 2012-12-25 NOTE — Telephone Encounter (Signed)
Patient's insurance says they will not cover novolog flexpen.  He is down to his last shot.   could he have some samples.   They told him he needs to switch to Humalog flexpens and [facility] uses [facility] and also [facility] [address] in [place].  Please call in Humalog for him to [facility]

## 2012-12-25 NOTE — Telephone Encounter (Signed)
I informed pt that we have 1 flex pen he can come and get for a sample but he will need to talk to Dr George Hugh office to change his medication. He stated he had called and left a message but hasn't heard back yet. I advised patient to call and leave another message with them. Patient is coming by today to get this sample

## 2012-12-29 ENCOUNTER — Ambulatory Visit (INDEPENDENT_AMBULATORY_CARE_PROVIDER_SITE_OTHER): Payer: Commercial Managed Care - PPO | Admitting: Endocrinology

## 2012-12-29 ENCOUNTER — Encounter: Payer: Self-pay | Admitting: Endocrinology

## 2012-12-29 VITALS — BP 128/80 | HR 75 | Wt 259.0 lb

## 2012-12-29 DIAGNOSIS — E119 Type 2 diabetes mellitus without complications: Secondary | ICD-10-CM

## 2012-12-29 MED ORDER — INSULIN GLARGINE 100 UNIT/ML ~~LOC~~ SOLN: 150.0000 [IU] | Freq: Every day | SUBCUTANEOUS | Status: DC

## 2012-12-29 NOTE — Progress Notes (Signed)
Subjective:    Patient ID: Gabriel [name], male    DOB: 07-16-1951, 62 y.o.   MRN: [id number]  HPI Pt returns for f/u of insulin-requiring DM (dx'ed 1994;  complicated by CVA; he has never had an episode of severe hypoglycemia or DKA).  He works 3rd shift.  no cbg record, but states cbg's are now highest when he finishes his shift in the morning, and now lowest after he has been sleeping during the day.   Past Medical History  Diagnosis Date  . Arthritis   . Depression   . History of chronic bronchitis     dx 2011  . Seasonal allergies   . Heart murmur   . Hypertension   . Diabetes mellitus, type 2   . Hyperlipidemia   . 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 Congestive heart failure     September 2011-CHF  . History of chicken pox   . Obesity, unspecified 11/27/2012  . Tobacco abuse 11/27/2012    Down from 3 ppd to 1/2 ppd    Past Surgical History  Procedure Laterality Date  . Knee surgery  1976, 1989    right knee torn ligament repair  . Lumbar fusion  1982    L4-L5  . Revision total knee arthroplasty  2007    right knee replacement    History   Social History  . Marital Status: Widowed    Spouse Name: N/A    Number of Children: N/A  . 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 Smoking status: Current Every Day Smoker -- 1.00 packs/day  . Smokeless tobacco: Never Used     Comment: smokes a pack per day-started in 1972  . Alcohol Use: No  . Drug Use: No  . Sexually Active: Not on file   Other Topics Concern  . Not on file   Social History Narrative   Regular exercise-no   Caffeine Use-yes          Current Outpatient Prescriptions on File Prior to Visit  Medication Sig Dispense Refill  . albuterol (PROVENTIL HFA;VENTOLIN HFA) 108 (90 BASE) MCG/ACT inhaler Inhale 2 puffs into the lungs every 6 (six) hours as needed for wheezing.  1 Inhaler  0  .  amitriptyline (ELAVIL) 10 MG tablet TAKE 1 TABLET AT BEDTIME  90 tablet  0  . amLODipine (NORVASC) 10 MG tablet TAKE 1 TABLET DAILY  90 tablet  0  . aspirin 81 MG tablet Take 81 mg by mouth daily.        . carvedilol (COREG) 25 MG tablet Take one and one half tablet by mouth twice a day  270 tablet  1  . furosemide (LASIX) 40 MG tablet TAKE 1 TABLET DAILY  90 tablet  0  . Krill Oil CAPS MegaRed or a generic by schiff daily      . lisinopril (PRINIVIL,ZESTRIL) 40 MG tablet TAKE 1 TABLET DAILY  90 tablet  0  . Multiple Vitamin (MULTIVITAMIN) tablet Take 1 tablet by mouth daily.        . nepafenac (NEVANAC) 0.1 % ophthalmic suspension Place 3 drops into the left eye 3 (three) times daily.      . pravastatin (PRAVACHOL) 20 MG tablet Take 1 tablet (20 mg total) by mouth every evening.  30 tablet  3  . sertraline (ZOLOFT) 100 MG tablet Take  one and one half tablets by mouth at bedtime  45 tablet  6  . vardenafil (LEVITRA) 10 MG tablet Take 10 mg by mouth daily as needed.       No current facility-administered medications on file prior to visit.    No Known Allergies  Family History  Problem Relation Age of Onset  . Arthritis      mother/father/paternal grandparents  . Heart disease Father     pacemaker  . Hypertension Father   . Hypertension Paternal Grandfather   . Hypertension Brother   . Emotional abuse Mother   . Diabetes Brother      x 2  . Diabetes Paternal Grandmother     BP 128/80  Pulse 75  Wt 259 lb (117.482 kg)  BMI 33.24 kg/m2  SpO2 96%   Review of Systems Denies LOC    Objective:   Physical Exam VITAL SIGNS:  See vs page GENERAL: no distress EXTEMITIES: no deformity.  no ulcer on the feet.  feet are of normal color and temp, but there is spotty hyperpigmentation of the legs.  no edema.  Several healed surgical scars on the right knee. PULSES: dorsalis pedis intact bilat.  no carotid bruit, but the murmur is transmitted NEURO:  cn 2-12 grossly intact.   readily  moves all 4's.  sensation is intact to touch on the feet     Assessment & Plan:  DM: He needs a simpler regimen.

## 2012-12-29 NOTE — Patient Instructions (Addendum)
check your blood sugar twice a day.  vary the time of day when you check, between before the 3 meals, and at bedtime.  also check if you have symptoms of your blood sugar being too high or too low.  please keep a record of the readings and bring it to your next appointment here.  please call us sooner if your blood sugar goes below 70, or if it stays over 200.   Please come back for a follow-up appointment in 2 weeks.   Change the insulin to lantus, 150 units daily.

## 2013-01-01 ENCOUNTER — Telehealth: Payer: Self-pay | Admitting: Endocrinology

## 2013-01-01 NOTE — Telephone Encounter (Signed)
The patient called to state that the pharmacy cannot fill his rx for insulin due to the fact that the rx did not have the dose amount listed.  Please resend corrected rx to patient's pharmacy.

## 2013-01-01 NOTE — Telephone Encounter (Signed)
Spoke with pharmacist they will change his # to 45 for a 30 day supply

## 2013-01-05 ENCOUNTER — Encounter: Payer: Self-pay | Admitting: Family Medicine

## 2013-01-05 ENCOUNTER — Ambulatory Visit (INDEPENDENT_AMBULATORY_CARE_PROVIDER_SITE_OTHER): Payer: Commercial Managed Care - PPO | Admitting: Family Medicine

## 2013-01-05 VITALS — BP 150/98 | HR 70 | Temp 98.1°F | Wt 254.0 lb

## 2013-01-05 DIAGNOSIS — J441 Chronic obstructive pulmonary disease with (acute) exacerbation: Secondary | ICD-10-CM

## 2013-01-05 DIAGNOSIS — J019 Acute sinusitis, unspecified: Secondary | ICD-10-CM

## 2013-01-05 DIAGNOSIS — J209 Acute bronchitis, unspecified: Secondary | ICD-10-CM

## 2013-01-05 MED ORDER — PREDNISONE 10 MG PO TABS: 10.0000 mg | ORAL_TABLET | Freq: Every day | ORAL | Status: DC

## 2013-01-05 MED ORDER — AMOXICILLIN-POT CLAVULANATE 875-125 MG PO TABS: 1.0000 | ORAL_TABLET | Freq: Two times a day (BID) | ORAL | Status: DC

## 2013-01-05 MED ORDER — GUAIFENESIN-CODEINE 100-10 MG/5ML PO SYRP: ORAL_SOLUTION | ORAL | Status: DC

## 2013-01-05 NOTE — Patient Instructions (Signed)

## 2013-01-05 NOTE — Progress Notes (Signed)
  Subjective:     Gabriel [name] is a 62 y.o. male who presents for evaluation of sinus pain. Symptoms include: congestion, cough, foul rhinorrhea, headaches, nasal congestion and sinus pressure. Onset of symptoms was 2 weeks ago. Symptoms have been gradually worsening since that time. Past history is significant for COPD. Patient is a smoker.  The following portions of the patient's history were reviewed and updated as appropriate: allergies, current medications, past family history, past medical history, past social history, past surgical history and problem list.  Review of Systems Pertinent items are noted in HPI.   Objective:    BP 150/98  Pulse 70  Temp(Src) 98.1 F (36.7 C) (Oral)  Wt 254 lb (115.214 kg)  BMI 32.6 kg/m2  SpO2 94% General appearance: alert, cooperative, appears stated age and no distress Ears: + fluid Nose: green discharge, moderate congestion, turbinates red, swollen, sinus tenderness bilateral Throat: abnormal findings: mild oropharyngeal erythema Neck: mild anterior cervical adenopathy, supple, symmetrical, trachea midline and thyroid not enlarged, symmetric, no tenderness/mass/nodules Lungs: wheezes bilaterally Heart: S1, S2 normal    Assessment:    Acute bacterial sinusitis Bronchitis with exac copd.    Plan:    Nasal steroids per medication orders. Antihistamines per medication orders. Augmentin per medication orders. pred taper  con't inhalers

## 2013-01-12 ENCOUNTER — Ambulatory Visit (INDEPENDENT_AMBULATORY_CARE_PROVIDER_SITE_OTHER): Payer: Commercial Managed Care - PPO | Admitting: Endocrinology

## 2013-01-12 ENCOUNTER — Encounter: Payer: Self-pay | Admitting: Endocrinology

## 2013-01-12 VITALS — BP 126/80 | HR 75 | Wt 257.0 lb

## 2013-01-12 DIAGNOSIS — E1051 Type 1 diabetes mellitus with diabetic peripheral angiopathy without gangrene: Secondary | ICD-10-CM | POA: Insufficient documentation

## 2013-01-12 DIAGNOSIS — E119 Type 2 diabetes mellitus without complications: Secondary | ICD-10-CM

## 2013-01-12 DIAGNOSIS — E1059 Type 1 diabetes mellitus with other circulatory complications: Secondary | ICD-10-CM

## 2013-01-12 NOTE — Patient Instructions (Addendum)
check your blood sugar twice a day.  vary the time of day when you check, between before the 3 meals, and at bedtime.  also check if you have symptoms of your blood sugar being too high or too low.  please keep a record of the readings and bring it to your next appointment here.  please call us sooner if your blood sugar goes below 70, or if it stays over 200.   Please come back for a follow-up appointment in 3 months.   Please reduce the lantus to140 units daily.

## 2013-01-12 NOTE — Progress Notes (Signed)
Subjective:    Patient ID: Gabriel [name], male    DOB: 10-Aug-1951, 62 y.o.   MRN: [id number]  HPI Pt returns for f/u of insulin-requiring DM (dx'ed 1994;  complicated by CVA; he has never had an episode of severe hypoglycemia or DKA).  He works 3rd shift.  he brings a record of his cbg's which i have reviewed today.  It varies from 87-283, but the vast majority are approx 100.   Past Medical History  Diagnosis Date  . Arthritis   . Depression   . History of chronic bronchitis     dx 2011  . Seasonal allergies   . Heart murmur   . Hypertension   . Diabetes mellitus, type 2   . Hyperlipidemia   . 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 Congestive heart failure     September 2011-CHF  . History of chicken pox   . Obesity, unspecified 11/27/2012  . Tobacco abuse 11/27/2012    Down from 3 ppd to 1/2 ppd    Past Surgical History  Procedure Laterality Date  . Knee surgery  1976, 1989    right knee torn ligament repair  . Lumbar fusion  1982    L4-L5  . Revision total knee arthroplasty  2007    right knee replacement    History   Social History  . Marital Status: Widowed    Spouse Name: N/A    Number of Children: N/A  . 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 Smoking status: Current Every Day Smoker -- 1.00 packs/day  . Smokeless tobacco: Never Used     Comment: smokes a pack per day-started in 1972  . Alcohol Use: No  . Drug Use: No  . Sexually Active: Not on file   Other Topics Concern  . Not on file   Social History Narrative   Regular exercise-no   Caffeine Use-yes          Current Outpatient Prescriptions on File Prior to Visit  Medication Sig Dispense Refill  . albuterol (PROVENTIL HFA;VENTOLIN HFA) 108 (90 BASE) MCG/ACT inhaler Inhale 2 puffs into the lungs every 6 (six) hours as needed for wheezing.  1 Inhaler  0  . amitriptyline (ELAVIL) 10 MG  tablet TAKE 1 TABLET AT BEDTIME  90 tablet  0  . amLODipine (NORVASC) 10 MG tablet TAKE 1 TABLET DAILY  90 tablet  0  . amoxicillin-clavulanate (AUGMENTIN) 875-125 MG per tablet Take 1 tablet by mouth 2 (two) times daily.  20 tablet  0  . aspirin 81 MG tablet Take 81 mg by mouth daily.        . carvedilol (COREG) 25 MG tablet Take one and one half tablet by mouth twice a day  270 tablet  1  . furosemide (LASIX) 40 MG tablet TAKE 1 TABLET DAILY  90 tablet  0  . guaiFENesin-codeine (ROBITUSSIN AC) 100-10 MG/5ML syrup 1-2 tsp po qhs prn cough  120 mL  0  . insulin glargine (LANTUS SOLOSTAR) 100 UNIT/ML injection Inject 150 Units into the skin daily. And pen needles 1/day  20 pen  PRN  . Krill Oil CAPS MegaRed or a generic by schiff daily      . lisinopril (PRINIVIL,ZESTRIL) 40 MG tablet TAKE 1 TABLET DAILY  90 tablet  0  . Multiple Vitamin (MULTIVITAMIN) tablet Take 1 tablet  by mouth daily.        . nepafenac (NEVANAC) 0.1 % ophthalmic suspension Place 3 drops into the left eye 3 (three) times daily.      . pravastatin (PRAVACHOL) 20 MG tablet Take 1 tablet (20 mg total) by mouth every evening.  30 tablet  3  . predniSONE (DELTASONE) 10 MG tablet Take 1 tablet (10 mg total) by mouth daily. 3 po qd for 3 days then 2 po qd for 3 days the 1 po qd for 3 days  18 tablet  0  . sertraline (ZOLOFT) 100 MG tablet Take one and one half tablets by mouth at bedtime  45 tablet  6  . vardenafil (LEVITRA) 10 MG tablet Take 10 mg by mouth daily as needed.       No current facility-administered medications on file prior to visit.    No Known Allergies  Family History  Problem Relation Age of Onset  . Arthritis      mother/father/paternal grandparents  . Heart disease Father     pacemaker  . Hypertension Father   . Hypertension Paternal Grandfather   . Hypertension Brother   . Emotional abuse Mother   . Diabetes Brother      x 2  . Diabetes Paternal Grandmother     BP 126/80  Pulse 75  Wt 257 lb  (116.574 kg)  BMI 32.98 kg/m2  SpO2 94%    Review of Systems denies hypoglycemia.    Objective:   Physical Exam VITAL SIGNS:  See vs page GENERAL: no distress SKIN:  Insulin injection sites at the anterior abdomen are normal       Assessment & Plan:  therapy limited by pt's need for a simple regimen.  overcontrolled.

## 2013-01-25 ENCOUNTER — Other Ambulatory Visit: Payer: Self-pay

## 2013-01-25 MED ORDER — AMLODIPINE BESYLATE 10 MG PO TABS: 10.0000 mg | ORAL_TABLET | Freq: Every day | ORAL | Status: DC

## 2013-02-08 ENCOUNTER — Encounter: Payer: Self-pay | Admitting: Family Medicine

## 2013-02-08 ENCOUNTER — Ambulatory Visit (INDEPENDENT_AMBULATORY_CARE_PROVIDER_SITE_OTHER): Payer: Commercial Managed Care - PPO | Admitting: Family Medicine

## 2013-02-08 VITALS — BP 142/78 | HR 69 | Temp 98.2°F | Ht 74.0 in | Wt 258.0 lb

## 2013-02-08 DIAGNOSIS — I10 Essential (primary) hypertension: Secondary | ICD-10-CM

## 2013-02-08 DIAGNOSIS — F17200 Nicotine dependence, unspecified, uncomplicated: Secondary | ICD-10-CM

## 2013-02-08 DIAGNOSIS — Z720 Tobacco use: Secondary | ICD-10-CM

## 2013-02-08 DIAGNOSIS — E785 Hyperlipidemia, unspecified: Secondary | ICD-10-CM

## 2013-02-08 DIAGNOSIS — E119 Type 2 diabetes mellitus without complications: Secondary | ICD-10-CM

## 2013-02-08 DIAGNOSIS — T7840XA Allergy, unspecified, initial encounter: Secondary | ICD-10-CM

## 2013-02-08 HISTORY — DX: Allergy, unspecified, initial encounter: T78.40XA

## 2013-02-08 LAB — HEPATIC FUNCTION PANEL
Albumin: 4.5 g/dL (ref 3.5–5.2)
Bilirubin, Direct: 0.1 mg/dL (ref 0.0–0.3)
Total Bilirubin: 0.4 mg/dL (ref 0.3–1.2)

## 2013-02-08 LAB — LIPID PANEL
HDL: 28 mg/dL — ABNORMAL LOW (ref >39)
LDL Cholesterol: 79 mg/dL (ref 0–99)
Triglycerides: 204 mg/dL — ABNORMAL HIGH (ref <150)
VLDL: 41 mg/dL — ABNORMAL HIGH (ref 0–40)

## 2013-02-08 LAB — TSH: TSH: 0.478 u[IU]/mL (ref 0.350–4.500)

## 2013-02-08 LAB — CBC
HCT: 45 % (ref 39.0–52.0)
Hemoglobin: 15.9 g/dL (ref 13.0–17.0)
MCHC: 35.3 g/dL (ref 30.0–36.0)
RBC: 5.06 MIL/uL (ref 4.22–5.81)
WBC: 8.7 10*3/uL (ref 4.0–10.5)

## 2013-02-08 LAB — RENAL FUNCTION PANEL
Albumin: 4.5 g/dL (ref 3.5–5.2)
Calcium: 9.8 mg/dL (ref 8.4–10.5)
Creat: 0.99 mg/dL (ref 0.50–1.35)
Phosphorus: 3.3 mg/dL (ref 2.3–4.6)
Sodium: 134 mEq/L — ABNORMAL LOW (ref 135–145)

## 2013-02-08 LAB — HEMOGLOBIN A1C: Hgb A1c MFr Bld: 8.4 % — ABNORMAL HIGH (ref <5.7)

## 2013-02-08 MED ORDER — FLUTICASONE PROPIONATE 50 MCG/ACT NA SUSP: 2.0000 | Freq: Every day | NASAL | Status: DC | PRN

## 2013-02-08 NOTE — Patient Instructions (Addendum)
Labs prior to visit, lipid, renal, tsh, hepatic, hgba1c, cbc  Allergies, Generic Allergies may happen from anything your body is sensitive to. This may be food, medicines, pollens, chemicals, and nearly anything around you in everyday life that produces allergens. An allergen is anything that causes an allergy producing substance. Heredity is often a factor in causing these problems. This means you may have some of the same allergies as your parents. Food allergies happen in all age groups. Food allergies are some of the most severe and life threatening. Some common food allergies are cow's milk, seafood, eggs, nuts, wheat, and soybeans. SYMPTOMS   Swelling around the mouth.  An itchy red rash or hives.  Vomiting or diarrhea.  Difficulty breathing. SEVERE ALLERGIC REACTIONS ARE LIFE-THREATENING. This reaction is called anaphylaxis. It can cause the mouth and throat to swell and cause difficulty with breathing and swallowing. In severe reactions only a trace amount of food (for example, peanut oil in a salad) may cause death within seconds. Seasonal allergies occur in all age groups. These are seasonal because they usually occur during the same season every year. They may be a reaction to molds, grass pollens, or tree pollens. Other causes of problems are house dust mite allergens, pet dander, and mold spores. The symptoms often consist of nasal congestion, a runny itchy nose associated with sneezing, and tearing itchy eyes. There is often an associated itching of the mouth and ears. The problems happen when you come in contact with pollens and other allergens. Allergens are the particles in the air that the body reacts to with an allergic reaction. This causes you to release allergic antibodies. Through a chain of events, these eventually cause you to release histamine into the blood stream. Although it is meant to be protective to the body, it is this release that causes your discomfort. This is  why you were given anti-histamines to feel better. If you are unable to pinpoint the offending allergen, it may be determined by skin or blood testing. Allergies cannot be cured but can be controlled with medicine. Hay fever is a collection of all or some of the seasonal allergy problems. It may often be treated with simple over-the-counter medicine such as diphenhydramine. Take medicine as directed. Do not drink alcohol or drive while taking this medicine. Check with your caregiver or package insert for child dosages. If these medicines are not effective, there are many new medicines your caregiver can prescribe. Stronger medicine such as nasal spray, eye drops, and corticosteroids may be used if the first things you try do not work well. Other treatments such as immunotherapy or desensitizing injections can be used if all else fails. Follow up with your caregiver if problems continue. These seasonal allergies are usually not life threatening. They are generally more of a nuisance that can often be handled using medicine. HOME CARE INSTRUCTIONS   If unsure what causes a reaction, keep a diary of foods eaten and symptoms that follow. Avoid foods that cause reactions.  If hives or rash are present:  Take medicine as directed.  You may use an over-the-counter antihistamine (diphenhydramine) for hives and itching as needed.  Apply cold compresses (cloths) to the skin or take baths in cool water. Avoid hot baths or showers. Heat will make a rash and itching worse.  If you are severely allergic:  Following a treatment for a severe reaction, hospitalization is often required for closer follow-up.  Wear a medic-alert bracelet or necklace stating the allergy.  You and your family must learn how to give adrenaline or use an anaphylaxis kit.  If you have had a severe reaction, always carry your anaphylaxis kit or EpiPen with you. Use this medicine as directed by your caregiver if a severe reaction is  occurring. Failure to do so could have a fatal outcome. SEEK MEDICAL CARE IF:  You suspect a food allergy. Symptoms generally happen within 30 minutes of eating a food.  Your symptoms have not gone away within 2 days or are getting worse.  You develop new symptoms.  You want to retest yourself or your child with a food or drink you think causes an allergic reaction. Never do this if an anaphylactic reaction to that food or drink has happened before. Only do this under the care of a caregiver. SEEK IMMEDIATE MEDICAL CARE IF:   You have difficulty breathing, are wheezing, or have a tight feeling in your chest or throat.  You have a swollen mouth, or you have hives, swelling, or itching all over your body.  You have had a severe reaction that has responded to your anaphylaxis kit or an EpiPen. These reactions may return when the medicine has worn off. These reactions should be considered life threatening. MAKE SURE YOU:   Understand these instructions.  Will watch your condition.  Will get help right away if you are not doing well or get worse. Document Released: 12/28/2002 Document Revised: 12/27/2011 Document Reviewed: 06/03/2008 [facility] Patient Information 2013 [place], [place].

## 2013-02-08 NOTE — Assessment & Plan Note (Addendum)
Unfortunately still smoking, encouraged cessation

## 2013-02-08 NOTE — Assessment & Plan Note (Signed)
Avoid trans fats, continue krill oil and check panel today

## 2013-02-08 NOTE — Assessment & Plan Note (Addendum)
Follows with endocrinology , hgba1c is improved, encouraged minimize simple carbs, continue Lantus at 140 units and follow up with endocrinology

## 2013-02-08 NOTE — Assessment & Plan Note (Addendum)
Much better when he is not here. Improved with recheck

## 2013-02-11 NOTE — Progress Notes (Signed)
Patient ID: Gabriel [name], male   DOB: [birth year], 62 y.o.   MRN: [id number] Gabriel [name] [id number] Jan 03, 1951 02/11/2013      Progress Note-Follow Up  Subjective  Chief Complaint  Chief Complaint  Patient presents with  . Follow-up    3 month    HPI  Patient is a 62 year old male who is in today for followup. He reports he generally feels well. He notes he checks his blood pressure somewhat frequently is generally seeing numbers in the 120s over 80s. Denies any headaches or chest pains. No palpitations or shortness of breath. No recent illness. He has been using roughly 140 units of Lantus . He works the night shift so he takes his toes when he first gets some. No polyuria or polydipsia. No GI or GU complaints at this time.daily  Past Medical History  Diagnosis Date  . Arthritis   . Depression   . History of chronic bronchitis     dx 2011  . Seasonal allergies   . Heart murmur   . Hypertension   . Diabetes mellitus, type 2   . Hyperlipidemia   . 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 Congestive heart failure     September 2011-CHF  . History of chicken pox   . Obesity, unspecified 11/27/2012  . Tobacco abuse 11/27/2012    Down from 3 ppd to 1/2 ppd  . Allergic state 02/08/2013    Past Surgical History  Procedure Laterality Date  . Knee surgery  1976, 1989    right knee torn ligament repair  . Lumbar fusion  1982    L4-L5  . Revision total knee arthroplasty  2007    right knee replacement    Family History  Problem Relation Age of Onset  . Arthritis      mother/father/paternal grandparents  . Heart disease Father     pacemaker  . Hypertension Father   . Hypertension Paternal Grandfather   . Hypertension Brother   . Emotional abuse Mother   . Diabetes Brother      x 2  . Diabetes Paternal Grandmother     History   Social History  . Marital Status: Widowed    Spouse Name: N/A    Number of Children: N/A  . Years  of Education: 16   Occupational History  . Security  [employer]   Social History Main Topics  . Smoking status: Current Every Day Smoker -- 1.00 packs/day  . Smokeless tobacco: Never Used     Comment: smokes a pack per day-started in 1972  . Alcohol Use: No  . Drug Use: No  . Sexually Active: Not on file   Other Topics Concern  . Not on file   Social History Narrative   Regular exercise-no   Caffeine Use-yes          Current Outpatient Prescriptions on File Prior to Visit  Medication Sig Dispense Refill  . albuterol (PROVENTIL HFA;VENTOLIN HFA) 108 (90 BASE) MCG/ACT inhaler Inhale 2 puffs into the lungs every 6 (six) hours as needed for wheezing.  1 Inhaler  0  . amitriptyline (ELAVIL) 10 MG tablet TAKE 1 TABLET AT BEDTIME  90 tablet  0  . amLODipine (NORVASC) 10 MG tablet Take 1 tablet (10 mg total) by mouth daily.  90 tablet  1  . aspirin 81 MG tablet Take 81 mg by mouth daily.        . carvedilol (  COREG) 25 MG tablet Take one and one half tablet by mouth twice a day  270 tablet  1  . furosemide (LASIX) 40 MG tablet TAKE 1 TABLET DAILY  90 tablet  0  . insulin glargine (LANTUS) 100 UNIT/ML injection Inject 140 Units into the skin daily. And pen needles 1/day      . Krill Oil CAPS MegaRed or a generic by schiff daily      . lisinopril (PRINIVIL,ZESTRIL) 40 MG tablet TAKE 1 TABLET DAILY  90 tablet  0  . Multiple Vitamin (MULTIVITAMIN) tablet Take 1 tablet by mouth daily.        . pravastatin (PRAVACHOL) 20 MG tablet Take 1 tablet (20 mg total) by mouth every evening.  30 tablet  3  . sertraline (ZOLOFT) 100 MG tablet Take one and one half tablets by mouth at bedtime  45 tablet  6  . vardenafil (LEVITRA) 10 MG tablet Take 10 mg by mouth daily as needed.       No current facility-administered medications on file prior to visit.    No Known Allergies  Review of Systems  Review of Systems  Constitutional: Negative for fever and malaise/fatigue.  HENT: Negative for  congestion.   Eyes: Negative for discharge.  Respiratory: Negative for shortness of breath.   Cardiovascular: Negative for chest pain, palpitations and leg swelling.  Gastrointestinal: Negative for nausea, abdominal pain and diarrhea.  Genitourinary: Negative for dysuria.  Musculoskeletal: Negative for falls.  Skin: Negative for rash.  Neurological: Negative for loss of consciousness and headaches.  Endo/Heme/Allergies: Negative for polydipsia.  Psychiatric/Behavioral: Negative for depression and suicidal ideas. The patient is not nervous/anxious and does not have insomnia.     Objective  BP 142/78  Pulse 69  Temp(Src) 98.2 F (36.8 C) (Oral)  Ht 6\' 2"  (1.88 m)  Wt 258 lb 0.6 oz (117.046 kg)  BMI 33.12 kg/m2  SpO2 97%  Physical Exam  Physical Exam  Constitutional: He is oriented to person, place, and time and well-developed, well-nourished, and in no distress. No distress.  HENT:  Head: Normocephalic and atraumatic.  Eyes: Conjunctivae are normal.  Neck: Neck supple. No thyromegaly present.  Cardiovascular: Normal rate, regular rhythm and normal heart sounds.   No murmur heard. Pulmonary/Chest: Effort normal and breath sounds normal. No respiratory distress.  Abdominal: He exhibits no distension and no mass. There is no tenderness.  Musculoskeletal: He exhibits no edema.  Neurological: He is alert and oriented to person, place, and time.  Skin: Skin is warm.  Psychiatric: Memory, affect and judgment normal.    Lab Results  Component Value Date   TSH 0.478 02/08/2013   Lab Results  Component Value Date   WBC 8.7 02/08/2013   HGB 15.9 02/08/2013   HCT 45.0 02/08/2013   MCV 88.9 02/08/2013   PLT 173 02/08/2013   Lab Results  Component Value Date   CREATININE 0.99 02/08/2013   BUN 20 02/08/2013   NA 134* 02/08/2013   K 4.8 02/08/2013   CL 98 02/08/2013   CO2 29 02/08/2013   Lab Results  Component Value Date   ALT 16 02/08/2013   AST 12 02/08/2013   ALKPHOS 70  02/08/2013   BILITOT 0.4 02/08/2013   Lab Results  Component Value Date   CHOL 148 02/08/2013   Lab Results  Component Value Date   HDL 28* 02/08/2013   Lab Results  Component Value Date   LDLCALC 79 02/08/2013   Lab Results  Component  Value Date   TRIG 204* 02/08/2013   Lab Results  Component Value Date   CHOLHDL 5.3 02/08/2013     Assessment & Plan  HTN (hypertension) Much better when he is not here. Improved with recheck  DM (diabetes mellitus) Follows with endocrinology , hgba1c is improved, encouraged minimize simple carbs, continue Lantus at 140 units and follow up with endocrinology  Tobacco abuse Unfortunately still smoking, encouraged cessation  Other and unspecified hyperlipidemia Avoid trans fats, continue krill oil and check panel today

## 2013-02-26 ENCOUNTER — Ambulatory Visit: Payer: Commercial Managed Care - PPO | Admitting: Family Medicine

## 2013-02-27 ENCOUNTER — Other Ambulatory Visit: Payer: Self-pay | Admitting: *Deleted

## 2013-02-27 MED ORDER — INSULIN PEN NEEDLE 31G X 5 MM MISC: Status: DC

## 2013-02-27 MED ORDER — INSULIN GLARGINE 100 UNIT/ML ~~LOC~~ SOLN: 140.0000 [IU] | Freq: Every day | SUBCUTANEOUS | Status: DC

## 2013-03-15 ENCOUNTER — Telehealth: Payer: Self-pay | Admitting: Endocrinology

## 2013-03-15 MED ORDER — INSULIN GLARGINE 100 UNIT/ML ~~LOC~~ SOLN: 140.0000 [IU] | Freq: Every day | SUBCUTANEOUS | Status: DC

## 2013-03-24 ENCOUNTER — Other Ambulatory Visit: Payer: Self-pay | Admitting: Internal Medicine

## 2013-03-26 NOTE — Telephone Encounter (Signed)
Refill request for Zoloft Last filled by MD on -08/16/12 #45 x6 Last seen-02/08/13 F/U-05/16/13 Please advise refill?

## 2013-03-27 ENCOUNTER — Telehealth: Payer: Self-pay | Admitting: Family Medicine

## 2013-03-27 NOTE — Telephone Encounter (Signed)
Closed on accident.  Please advise

## 2013-03-27 NOTE — Telephone Encounter (Signed)
RX wasn't sent.  Please verify if pt is taking this medication

## 2013-03-27 NOTE — Telephone Encounter (Signed)
RX sent

## 2013-03-27 NOTE — Telephone Encounter (Signed)
Refill-lopid 600mg  tab. Take one tablet by mouth twice daily with a meal. Qty 60 last fill 5.4.14

## 2013-03-27 NOTE — Telephone Encounter (Signed)
OK to refill Lopid 600 mg po bid  Disp #60 2 rf

## 2013-03-28 MED ORDER — GEMFIBROZIL 600 MG PO TABS: 600.0000 mg | ORAL_TABLET | Freq: Two times a day (BID) | ORAL | Status: DC

## 2013-03-28 NOTE — Addendum Note (Signed)
Addended by: Marlene Lard on: 03/28/2013 08:49 AM   Modules accepted: Orders

## 2013-04-05 ENCOUNTER — Other Ambulatory Visit: Payer: Self-pay | Admitting: Internal Medicine

## 2013-04-06 ENCOUNTER — Emergency Department (HOSPITAL_BASED_OUTPATIENT_CLINIC_OR_DEPARTMENT_OTHER)
Admission: EM | Admit: 2013-04-06 | Discharge: 2013-04-07 | Disposition: A | Discharge status: Home / Self Care | Payer: Commercial Managed Care - PPO | Attending: Emergency Medicine | Admitting: Emergency Medicine

## 2013-04-06 ENCOUNTER — Encounter (HOSPITAL_BASED_OUTPATIENT_CLINIC_OR_DEPARTMENT_OTHER): Payer: Self-pay | Admitting: *Deleted

## 2013-04-06 DIAGNOSIS — IMO0002 Reserved for concepts with insufficient information to code with codable children: Secondary | ICD-10-CM | POA: Insufficient documentation

## 2013-04-06 DIAGNOSIS — M549 Dorsalgia, unspecified: Secondary | ICD-10-CM

## 2013-04-06 DIAGNOSIS — M545 Low back pain, unspecified: Secondary | ICD-10-CM | POA: Insufficient documentation

## 2013-04-06 DIAGNOSIS — Z79899 Other long term (current) drug therapy: Secondary | ICD-10-CM | POA: Insufficient documentation

## 2013-04-06 DIAGNOSIS — G8929 Other chronic pain: Secondary | ICD-10-CM | POA: Insufficient documentation

## 2013-04-06 DIAGNOSIS — E669 Obesity, unspecified: Secondary | ICD-10-CM | POA: Insufficient documentation

## 2013-04-06 DIAGNOSIS — F329 Major depressive disorder, single episode, unspecified: Secondary | ICD-10-CM | POA: Insufficient documentation

## 2013-04-06 DIAGNOSIS — M129 Arthropathy, unspecified: Secondary | ICD-10-CM | POA: Insufficient documentation

## 2013-04-06 DIAGNOSIS — F17200 Nicotine dependence, unspecified, uncomplicated: Secondary | ICD-10-CM | POA: Insufficient documentation

## 2013-04-06 DIAGNOSIS — E119 Type 2 diabetes mellitus without complications: Secondary | ICD-10-CM | POA: Insufficient documentation

## 2013-04-06 DIAGNOSIS — Z7982 Long term (current) use of aspirin: Secondary | ICD-10-CM | POA: Insufficient documentation

## 2013-04-06 DIAGNOSIS — F3289 Other specified depressive episodes: Secondary | ICD-10-CM | POA: Insufficient documentation

## 2013-04-06 DIAGNOSIS — Z8709 Personal history of other diseases of the respiratory system: Secondary | ICD-10-CM | POA: Insufficient documentation

## 2013-04-06 DIAGNOSIS — E785 Hyperlipidemia, unspecified: Secondary | ICD-10-CM | POA: Insufficient documentation

## 2013-04-06 DIAGNOSIS — R011 Cardiac murmur, unspecified: Secondary | ICD-10-CM | POA: Insufficient documentation

## 2013-04-06 DIAGNOSIS — Z8673 Personal history of transient ischemic attack (TIA), and cerebral infarction without residual deficits: Secondary | ICD-10-CM | POA: Insufficient documentation

## 2013-04-06 DIAGNOSIS — I10 Essential (primary) hypertension: Secondary | ICD-10-CM | POA: Insufficient documentation

## 2013-04-06 DIAGNOSIS — Z8619 Personal history of other infectious and parasitic diseases: Secondary | ICD-10-CM | POA: Insufficient documentation

## 2013-04-06 DIAGNOSIS — I509 Heart failure, unspecified: Secondary | ICD-10-CM | POA: Insufficient documentation

## 2013-04-06 NOTE — ED Notes (Signed)
Lower back pain since this am. Hx of chronic back pain.

## 2013-04-07 MED ORDER — HYDROCODONE-ACETAMINOPHEN 5-325 MG PO TABS: 1.0000 | ORAL_TABLET | Freq: Four times a day (QID) | ORAL | Status: DC | PRN

## 2013-04-07 MED ORDER — CYCLOBENZAPRINE HCL 10 MG PO TABS: 10.0000 mg | ORAL_TABLET | Freq: Two times a day (BID) | ORAL | Status: DC | PRN

## 2013-04-07 NOTE — ED Provider Notes (Signed)
History     CSN: [id number]  Arrival date & time 04/06/13  2247   First MD Initiated Contact with Patient 04/07/13 0041      Chief Complaint  Patient presents with  . Back Pain    HPI Gabriel [name] is a 62 y.o. male history of chronic back pain and remote history of surgery on his lower back presents with left lower back pain. He says this began earlier this morning while at work, he lifted something heavy which typically does not do and felt a "twinge" in his left lower back. Discretion got worse as the day went on, pain is currently moderate, has not responded to ibuprofen, no associated left leg weakness. Patient says it is worse when walking, somewhat relieved when sitting still. Denies any urinary or fecal incontinence, urinary retention, saddle anesthesia, new numbness or tingling.  Patient does have some tingling along the backside of his leg, he says this is common on flareups.   Past Medical History  Diagnosis Date  . Arthritis   . Depression   . History of chronic bronchitis     dx 2011  . Seasonal allergies   . Heart murmur   . Hypertension   . Diabetes mellitus, type 2   . Hyperlipidemia   . 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 Congestive heart failure     September 2011-CHF  . History of chicken pox   . Obesity, unspecified 11/27/2012  . Tobacco abuse 11/27/2012    Down from 3 ppd to 1/2 ppd  . Allergic state 02/08/2013    Past Surgical History  Procedure Laterality Date  . Knee surgery  1976, 1989    right knee torn ligament repair  . Lumbar fusion  1982    L4-L5  . Revision total knee arthroplasty  2007    right knee replacement    Family History  Problem Relation Age of Onset  . Arthritis      mother/father/paternal grandparents  . Heart disease Father     pacemaker  . Hypertension Father   . Hypertension Paternal Grandfather   . Hypertension Brother   . Emotional abuse Mother   . Diabetes Brother      x  2  . Diabetes Paternal Grandmother     History  Substance Use Topics  . Smoking status: Current Every Day Smoker -- 1.00 packs/day  . Smokeless tobacco: Never Used     Comment: smokes a pack per day-started in 1972  . Alcohol Use: No      Review of Systems At least 10pt or greater review of systems completed and are negative except where specified in the HPI.  Allergies  Review of patient's allergies indicates no known allergies.  Home Medications   Current Outpatient Rx  Name  Route  Sig  Dispense  Refill  . EXPIRED: albuterol (PROVENTIL HFA;VENTOLIN HFA) 108 (90 BASE) MCG/ACT inhaler   Inhalation   Inhale 2 puffs into the lungs every 6 (six) hours as needed for wheezing.   1 Inhaler   0   . amitriptyline (ELAVIL) 10 MG tablet      TAKE 1 TABLET AT BEDTIME   90 tablet   0   . amLODipine (NORVASC) 10 MG tablet   Oral   Take 1 tablet (10 mg total) by mouth daily.   90 tablet   1   . aspirin 81 MG tablet   Oral  Take 81 mg by mouth daily.           . carvedilol (COREG) 25 MG tablet      TAKE ONE AND ONE-HALF TABLETS TWICE A DAY   270 tablet   0   . fluticasone (FLONASE) 50 MCG/ACT nasal spray   Nasal   Place 2 sprays into the nose daily as needed for rhinitis or allergies.   16 g   6   . furosemide (LASIX) 40 MG tablet      TAKE 1 TABLET DAILY   90 tablet   0   . gemfibrozil (LOPID) 600 MG tablet   Oral   Take 1 tablet (600 mg total) by mouth 2 (two) times daily before a meal.   60 tablet   2   . insulin glargine (LANTUS) 100 UNIT/ML injection   Subcutaneous   Inject 1.4 mLs (140 Units total) into the skin daily.   13 mL   prn   . Insulin Pen Needle 31G X 5 MM MISC      USE AS DIRECTED ONE TIME DAILY.   100 each   prn   . Krill Oil CAPS      MegaRed or a generic by schiff daily         . lisinopril (PRINIVIL,ZESTRIL) 40 MG tablet      TAKE 1 TABLET DAILY   90 tablet   0   . Multiple Vitamin (MULTIVITAMIN) tablet   Oral    Take 1 tablet by mouth daily.           . pravastatin (PRAVACHOL) 20 MG tablet   Oral   Take 1 tablet (20 mg total) by mouth every evening.   30 tablet   3   . sertraline (ZOLOFT) 100 MG tablet      TAKE ONE & ONE-HALF TABLETS BY MOUTH AT BEDTIME   45 tablet   5   . vardenafil (LEVITRA) 10 MG tablet   Oral   Take 10 mg by mouth daily as needed.           BP 151/74  Pulse 67  Temp(Src) 98.6 F (37 C) (Oral)  Resp 20  Ht 6\' 1"  (1.854 m)  Wt 255 lb (115.667 kg)  BMI 33.65 kg/m2  SpO2 98%  Physical Exam  Musculoskeletal:       Back:    Nursing notes reviewed.  Electronic medical record reviewed. VITAL SIGNS:   Filed Vitals:   04/06/13 2257  BP: 151/74  Pulse: 67  Temp: 98.6 F (37 C)  TempSrc: Oral  Resp: 20  Height: 6\' 1"  (1.854 m)  Weight: 255 lb (115.667 kg)  SpO2: 98%   CONSTITUTIONAL: Awake, oriented, appears non-toxic HENT: Atraumatic, normocephalic, oral mucosa pink and moist, airway patent. Nares patent without drainage. External ears normal. EYES: Conjunctiva clear, EOMI, PERRLA NECK: Trachea midline, non-tender, supple CARDIOVASCULAR: Normal heart rate, Normal rhythm, No murmurs, rubs, gallops PULMONARY/CHEST: Clear to auscultation, no rhonchi, wheezes, or rales. Symmetrical breath sounds. Non-tender. ABDOMINAL: Non-distended, soft, non-tender - no rebound or guarding.  BS normal. BACK: No step-offs or deformities, nontender to palpation in the midline, well-healed surgical incision in the midline in the L4-S1 region. Tenderness to palpation in the left lower lumbar paraspinous muscles, tenderness in the left superior gluteus muscle. NEUROLOGIC: Non-focal, moving all four extremities, no gross sensory or motor deficits. 2+ patellar reflexes bilaterally, no clonus. Patient walks with a slightly antalgic gait favoring the left leg. EXTREMITIES: No clubbing, cyanosis,  or edema SKIN: Warm, Dry, No erythema, No rash  ED Course  Procedures (including  critical care time)  Labs Reviewed - No data to display No results found.   1. Back pain       MDM  Patient presents with acute on chronic low back pain irritating the patient's sciatica, no red flags that would indicate further workup needed-I. do not think any x-rays or labs at this time. Patient appears comfortable, is nontoxic, afebrile, is not tachycardic only has some mild hypertension. I do not think this patient has a cord compression syndrome, cauda equina syndrome, osteomyelitis, epidural abscess or medical or surgical emergency of the musculoskeletal system at this time  Patient was discharged with some pain medicine, Valium for muscle spasm and instructions to followup with his primary care physician next week  I explained the diagnosis and have given explicit precautions to return to the ER including numbness, tingling, weakness, urinary or fecal incontinence, urinary retention, saddle anesthesia or any other new or worsening symptoms. The patient understands and accepts the medical plan as it's been dictated and I have answered their questions. Discharge instructions concerning home care and prescriptions have been given.  The patient is STABLE and is discharged to home in good condition.        Jones Skene, MD 04/07/13 [phone]

## 2013-04-16 ENCOUNTER — Telehealth: Payer: Self-pay | Admitting: Family Medicine

## 2013-04-16 MED ORDER — PRAVASTATIN SODIUM 20 MG PO TABS: 20.0000 mg | ORAL_TABLET | Freq: Every evening | ORAL | Status: DC

## 2013-04-16 NOTE — Telephone Encounter (Signed)
Refill- pravastatin 20mg  tab. Take one tablet by mouth in the evening. Qty 30 last fill 5.28.14

## 2013-04-26 ENCOUNTER — Other Ambulatory Visit: Payer: Self-pay

## 2013-04-30 ENCOUNTER — Other Ambulatory Visit: Payer: Self-pay | Admitting: Family Medicine

## 2013-04-30 MED ORDER — AMLODIPINE BESYLATE 10 MG PO TABS: 10.0000 mg | ORAL_TABLET | Freq: Every day | ORAL | Status: DC

## 2013-05-04 ENCOUNTER — Ambulatory Visit: Payer: Commercial Managed Care - PPO | Admitting: Endocrinology

## 2013-05-04 DIAGNOSIS — Z0289 Encounter for other administrative examinations: Secondary | ICD-10-CM

## 2013-05-11 ENCOUNTER — Telehealth: Payer: Self-pay

## 2013-05-11 DIAGNOSIS — E785 Hyperlipidemia, unspecified: Secondary | ICD-10-CM

## 2013-05-11 DIAGNOSIS — I10 Essential (primary) hypertension: Secondary | ICD-10-CM

## 2013-05-11 DIAGNOSIS — E119 Type 2 diabetes mellitus without complications: Secondary | ICD-10-CM

## 2013-05-11 LAB — RENAL FUNCTION PANEL
Albumin: 4.4 g/dL (ref 3.5–5.2)
BUN: 21 mg/dL (ref 6–23)
CO2: 26 meq/L (ref 19–32)
Calcium: 9.8 mg/dL (ref 8.4–10.5)
Chloride: 101 meq/L (ref 96–112)
Creat: 1.24 mg/dL (ref 0.50–1.35)
Glucose, Bld: 202 mg/dL — ABNORMAL HIGH (ref 70–99)
Phosphorus: 3.2 mg/dL (ref 2.3–4.6)
Potassium: 4.5 meq/L (ref 3.5–5.3)
Sodium: 137 meq/L (ref 135–145)

## 2013-05-11 LAB — HEPATIC FUNCTION PANEL
ALT: 17 U/L (ref 0–53)
Albumin: 4.4 g/dL (ref 3.5–5.2)
Alkaline Phosphatase: 62 U/L (ref 39–117)
Indirect Bilirubin: 0.3 mg/dL (ref 0.0–0.9)
Total Protein: 6.9 g/dL (ref 6.0–8.3)

## 2013-05-11 LAB — CBC
HCT: 42.4 % (ref 39.0–52.0)
Hemoglobin: 15.3 g/dL (ref 13.0–17.0)
MCH: 31.7 pg (ref 26.0–34.0)
MCHC: 36.1 g/dL — ABNORMAL HIGH (ref 30.0–36.0)
MCV: 88 fL (ref 78.0–100.0)
Platelets: 194 10*3/uL (ref 150–400)
RBC: 4.82 MIL/uL (ref 4.22–5.81)
RDW: 13.7 % (ref 11.5–15.5)
WBC: 11.6 10*3/uL — ABNORMAL HIGH (ref 4.0–10.5)

## 2013-05-11 LAB — LIPID PANEL
Cholesterol: 151 mg/dL (ref 0–200)
HDL: 27 mg/dL — ABNORMAL LOW (ref >39)
LDL Cholesterol: 73 mg/dL (ref 0–99)
Triglycerides: 256 mg/dL — ABNORMAL HIGH (ref <150)
VLDL: 51 mg/dL — ABNORMAL HIGH (ref 0–40)

## 2013-05-11 LAB — HEMOGLOBIN A1C
Hgb A1c MFr Bld: 7.5 % — ABNORMAL HIGH (ref <5.7)
Mean Plasma Glucose: 169 mg/dL — ABNORMAL HIGH (ref <117)

## 2013-05-11 LAB — TSH: TSH: 0.95 u[IU]/mL (ref 0.350–4.500)

## 2013-05-11 NOTE — Telephone Encounter (Signed)
Lab order placed.

## 2013-05-14 ENCOUNTER — Other Ambulatory Visit: Payer: Self-pay | Admitting: *Deleted

## 2013-05-14 MED ORDER — GEMFIBROZIL 600 MG PO TABS: 600.0000 mg | ORAL_TABLET | Freq: Two times a day (BID) | ORAL | Status: DC

## 2013-05-14 MED ORDER — PRAVASTATIN SODIUM 20 MG PO TABS: 20.0000 mg | ORAL_TABLET | Freq: Every evening | ORAL | Status: DC

## 2013-05-14 MED ORDER — SERTRALINE HCL 100 MG PO TABS: 150.0000 mg | ORAL_TABLET | Freq: Every day | ORAL | Status: DC

## 2013-05-14 NOTE — Telephone Encounter (Signed)
Ok to switch to [employer], 90 day with 1 refill on all 3 if he is taking them as prescribed

## 2013-05-14 NOTE — Telephone Encounter (Signed)
[facility] are requesting 90 refills on pravastatin, sertraline, and gemfibrozil. Patient has already rx with refills on all of these meds, that he filled at [facility]. Please advise?

## 2013-05-16 ENCOUNTER — Ambulatory Visit (HOSPITAL_BASED_OUTPATIENT_CLINIC_OR_DEPARTMENT_OTHER)
Admission: RE | Admit: 2013-05-16 | Discharge: 2013-05-16 | Disposition: A | Discharge status: Home / Self Care | Payer: Commercial Managed Care - PPO | Source: Ambulatory Visit | Attending: Family Medicine | Admitting: Family Medicine

## 2013-05-16 ENCOUNTER — Ambulatory Visit (INDEPENDENT_AMBULATORY_CARE_PROVIDER_SITE_OTHER): Payer: Commercial Managed Care - PPO | Admitting: Family Medicine

## 2013-05-16 ENCOUNTER — Encounter: Payer: Self-pay | Admitting: Family Medicine

## 2013-05-16 VITALS — BP 184/94 | HR 74 | Temp 98.1°F | Ht 74.0 in | Wt 263.1 lb

## 2013-05-16 DIAGNOSIS — I10 Essential (primary) hypertension: Secondary | ICD-10-CM

## 2013-05-16 DIAGNOSIS — I700 Atherosclerosis of aorta: Secondary | ICD-10-CM | POA: Insufficient documentation

## 2013-05-16 DIAGNOSIS — M51379 Other intervertebral disc degeneration, lumbosacral region without mention of lumbar back pain or lower extremity pain: Secondary | ICD-10-CM | POA: Insufficient documentation

## 2013-05-16 DIAGNOSIS — Z720 Tobacco use: Secondary | ICD-10-CM

## 2013-05-16 DIAGNOSIS — E1059 Type 1 diabetes mellitus with other circulatory complications: Secondary | ICD-10-CM

## 2013-05-16 DIAGNOSIS — M5410 Radiculopathy, site unspecified: Secondary | ICD-10-CM

## 2013-05-16 DIAGNOSIS — F17200 Nicotine dependence, unspecified, uncomplicated: Secondary | ICD-10-CM

## 2013-05-16 DIAGNOSIS — M5137 Other intervertebral disc degeneration, lumbosacral region: Secondary | ICD-10-CM | POA: Insufficient documentation

## 2013-05-16 DIAGNOSIS — IMO0002 Reserved for concepts with insufficient information to code with codable children: Secondary | ICD-10-CM

## 2013-05-16 DIAGNOSIS — E119 Type 2 diabetes mellitus without complications: Secondary | ICD-10-CM

## 2013-05-16 MED ORDER — METHOCARBAMOL 500 MG PO TABS: 500.0000 mg | ORAL_TABLET | Freq: Two times a day (BID) | ORAL | Status: DC | PRN

## 2013-05-16 MED ORDER — METHYLPREDNISOLONE (PAK) 4 MG PO TABS: ORAL_TABLET | ORAL | Status: DC

## 2013-05-16 MED ORDER — CYCLOBENZAPRINE HCL 10 MG PO TABS: 10.0000 mg | ORAL_TABLET | Freq: Two times a day (BID) | ORAL | Status: DC | PRN

## 2013-05-16 MED ORDER — TRIAMTERENE-HCTZ 37.5-25 MG PO TABS: 1.0000 | ORAL_TABLET | Freq: Every day | ORAL | Status: DC

## 2013-05-16 NOTE — Patient Instructions (Addendum)
Labs prior to next visit, lipid, renal, cbc, tsh, hepatic, hgba1c  So between Robaxin/Methocarbamol and Flexeril/Cyclobenzaprine max of 3 doses a day, space doses by at least 6 hours. Flexeril is usually more sedating. While on steroids decrease carbs and increase Lantus by 2 units as needed per day  For congestion start Mucinex 600 mg twice a day x 10 days and a probiotic such as Digestive Advantage, Align, or a generic    Sciatica Sciatica is pain, weakness, numbness, or tingling along the path of the sciatic nerve. The nerve starts in the lower back and runs down the back of each leg. The nerve controls the muscles in the lower leg and in the back of the knee, while also providing sensation to the back of the thigh, lower leg, and the sole of your foot. Sciatica is a symptom of another medical condition. For instance, nerve damage or certain conditions, such as a herniated disk or bone spur on the spine, pinch or put pressure on the sciatic nerve. This causes the pain, weakness, or other sensations normally associated with sciatica. Generally, sciatica only affects one side of the body. CAUSES   Herniated or slipped disc.  Degenerative disk disease.  A pain disorder involving the narrow muscle in the buttocks (piriformis syndrome).  Pelvic injury or fracture.  Pregnancy.  Tumor (rare). SYMPTOMS  Symptoms can vary from mild to very severe. The symptoms usually travel from the low back to the buttocks and down the back of the leg. Symptoms can include:  Mild tingling or dull aches in the lower back, leg, or hip.  Numbness in the back of the calf or sole of the foot.  Burning sensations in the lower back, leg, or hip.  Sharp pains in the lower back, leg, or hip.  Leg weakness.  Severe back pain inhibiting movement. These symptoms may get worse with coughing, sneezing, laughing, or prolonged sitting or standing. Also, being overweight may worsen symptoms. DIAGNOSIS  Your  caregiver will perform a physical exam to look for common symptoms of sciatica. He or she may ask you to do certain movements or activities that would trigger sciatic nerve pain. Other tests may be performed to find the cause of the sciatica. These may include:  Blood tests.  X-rays.  Imaging tests, such as an MRI or CT scan. TREATMENT  Treatment is directed at the cause of the sciatic pain. Sometimes, treatment is not necessary and the pain and discomfort goes away on its own. If treatment is needed, your caregiver may suggest:  Over-the-counter medicines to relieve pain.  Prescription medicines, such as anti-inflammatory medicine, muscle relaxants, or narcotics.  Applying heat or ice to the painful area.  Steroid injections to lessen pain, irritation, and inflammation around the nerve.  Reducing activity during periods of pain.  Exercising and stretching to strengthen your abdomen and improve flexibility of your spine. Your caregiver may suggest losing weight if the extra weight makes the back pain worse.  Physical therapy.  Surgery to eliminate what is pressing or pinching the nerve, such as a bone spur or part of a herniated disk. HOME CARE INSTRUCTIONS   Only take over-the-counter or prescription medicines for pain or discomfort as directed by your caregiver.  Apply ice to the affected area for 20 minutes, 3 4 times a day for the first 48 72 hours. Then try heat in the same way.  Exercise, stretch, or perform your usual activities if these do not aggravate your pain.  Attend  physical therapy sessions as directed by your caregiver.  Keep all follow-up appointments as directed by your caregiver.  Do not wear high heels or shoes that do not provide proper support.  Check your mattress to see if it is too soft. A firm mattress may lessen your pain and discomfort. SEEK IMMEDIATE MEDICAL CARE IF:   You lose control of your bowel or bladder (incontinence).  You have  increasing weakness in the lower back, pelvis, buttocks, or legs.  You have redness or swelling of your back.  You have a burning sensation when you urinate.  You have pain that gets worse when you lie down or awakens you at night.  Your pain is worse than you have experienced in the past.  Your pain is lasting longer than 4 weeks.  You are suddenly losing weight without reason. MAKE SURE YOU:  Understand these instructions.  Will watch your condition.  Will get help right away if you are not doing well or get worse. Document Released: 09/28/2001 Document Revised: 04/04/2012 Document Reviewed: 02/13/2012 [facility] Patient Information 2014 [place], [place].

## 2013-05-16 NOTE — Assessment & Plan Note (Signed)
Add Maxzide recheck next month, can hold Lasix if feeling too dry

## 2013-05-16 NOTE — Assessment & Plan Note (Signed)
Tylenol and Flexeril help slightly, was doing some heavy lifting at the time and has done this before. Tylenol and Flexeril have been marginally helpful. Check xray today try [place] and given steroids and Robaxin to add, report worsening or persistent symptoms

## 2013-05-16 NOTE — Assessment & Plan Note (Signed)
hgba1c improving, avoid simple carbs, continue current meds for now. asymptomatic 

## 2013-05-16 NOTE — Progress Notes (Signed)
Patient ID: Gabriel [name], male   DOB: 1951-09-22, 62 y.o.   MRN: [id number] Gabriel [name] [id number] 05/16/1951 05/16/2013      Progress Note-Follow Up  Subjective  Chief Complaint  Chief Complaint  Patient presents with  . Follow-up    3 month    HPI  Patient is a 62 year old male who is in today for followup. He was lifting some heavy containers at work last month and had sharp pain in his low back. Since that time he had increasing low back pain over the last couple of weeks he said some radicular symptoms down the left leg as well. He says the symptoms always occur after he walked 10-20 feet and he can be a warm and tingling  sensation. Resolves when he rests. Denies incontinence. He is having some increased low back pain. No fevers or chills. No recent illness. No GI or GU complaints. Chest pain, palpitations, shortness or breath, polyuria, polydipsia  Past Medical History  Diagnosis Date  . Arthritis   . Depression   . History of chronic bronchitis     dx 2011  . Seasonal allergies   . Heart murmur   . Hypertension   . Diabetes mellitus, type 2   . Hyperlipidemia   . 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 Congestive heart failure     September 2011-CHF  . History of chicken pox   . Obesity, unspecified 11/27/2012  . Tobacco abuse 11/27/2012    Down from 3 ppd to 1/2 ppd  . Allergic state 02/08/2013    Past Surgical History  Procedure Laterality Date  . Knee surgery  1976, 1989    right knee torn ligament repair  . Lumbar fusion  1982    L4-L5  . Revision total knee arthroplasty  2007    right knee replacement    Family History  Problem Relation Age of Onset  . Arthritis      mother/father/paternal grandparents  . Heart disease Father     pacemaker  . Hypertension Father   . Hypertension Paternal Grandfather   . Hypertension Brother   . Emotional abuse Mother   . Diabetes Brother      x 2  . Diabetes Paternal  Grandmother     History   Social History  . Marital Status: Widowed    Spouse Name: N/A    Number of Children: N/A  . 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 Smoking status: Current Every Day Smoker -- 1.00 packs/day  . Smokeless tobacco: Never Used     Comment: smokes a pack per day-started in 1972  . Alcohol Use: No  . Drug Use: No  . Sexually Active: Not on file   Other Topics Concern  . Not on file   Social History Narrative   Regular exercise-no   Caffeine Use-yes          Current Outpatient Prescriptions on File Prior to Visit  Medication Sig Dispense Refill  . albuterol (PROVENTIL HFA;VENTOLIN HFA) 108 (90 BASE) MCG/ACT inhaler Inhale 2 puffs into the lungs every 6 (six) hours as needed for wheezing.  1 Inhaler  0  . amitriptyline (ELAVIL) 10 MG tablet TAKE 1 TABLET AT BEDTIME  90 tablet  1  . amLODipine (NORVASC) 10 MG tablet Take 1 tablet (10 mg total) by mouth daily.  90 tablet  1  . aspirin 81 MG tablet Take 81 mg by mouth daily.        . carvedilol (COREG) 25 MG tablet TAKE ONE AND ONE-HALF TABLETS TWICE A DAY  270 tablet  0  . cyclobenzaprine (FLEXERIL) 10 MG tablet Take 1 tablet (10 mg total) by mouth 2 (two) times daily as needed for muscle spasms.  20 tablet  0  . fluticasone (FLONASE) 50 MCG/ACT nasal spray Place 2 sprays into the nose daily as needed for rhinitis or allergies.  16 g  6  . furosemide (LASIX) 40 MG tablet TAKE 1 TABLET DAILY  90 tablet  1  . gemfibrozil (LOPID) 600 MG tablet Take 1 tablet (600 mg total) by mouth 2 (two) times daily before a meal.  180 tablet  1  . HYDROcodone-acetaminophen (NORCO/VICODIN) 5-325 MG per tablet Take 1-2 tablets by mouth every 6 (six) hours as needed for pain.  17 tablet  0  . insulin glargine (LANTUS) 100 UNIT/ML injection Inject 1.4 mLs (140 Units total) into the skin daily.  13 mL  prn  . Insulin Pen Needle 31G X 5 MM MISC USE AS DIRECTED  ONE TIME DAILY.  100 each  prn  . Krill Oil CAPS MegaRed or a generic by schiff daily      . lisinopril (PRINIVIL,ZESTRIL) 40 MG tablet TAKE 1 TABLET DAILY  90 tablet  1  . Multiple Vitamin (MULTIVITAMIN) tablet Take 1 tablet by mouth daily.        . pravastatin (PRAVACHOL) 20 MG tablet Take 1 tablet (20 mg total) by mouth every evening.  90 tablet  1  . sertraline (ZOLOFT) 100 MG tablet Take 1.5 tablets (150 mg total) by mouth daily.  135 tablet  1  . vardenafil (LEVITRA) 10 MG tablet Take 10 mg by mouth daily as needed.       No current facility-administered medications on file prior to visit.    No Known Allergies  Review of Systems  Review of Systems  Constitutional: Negative for fever and malaise/fatigue.  HENT: Negative for congestion.   Eyes: Negative for discharge.  Respiratory: Negative for shortness of breath.   Cardiovascular: Negative for chest pain, palpitations and leg swelling.  Gastrointestinal: Negative for nausea, abdominal pain and diarrhea.  Genitourinary: Negative for dysuria.  Musculoskeletal: Negative for falls.  Skin: Negative for rash.  Neurological: Negative for loss of consciousness and headaches.  Endo/Heme/Allergies: Negative for polydipsia.  Psychiatric/Behavioral: Negative for depression and suicidal ideas. The patient is not nervous/anxious and does not have insomnia.     Objective  BP 170/98  Pulse 74  Temp(Src) 98.1 F (36.7 C) (Oral)  Ht 6\' 2"  (1.88 m)  Wt 263 lb 1.9 oz (119.35 kg)  BMI 33.77 kg/m2  SpO2 96%  Physical Exam  Physical Exam  Constitutional: He is oriented to person, place, and time and well-developed, well-nourished, and in no distress. No distress.  HENT:  Head: Normocephalic and atraumatic.  Eyes: Conjunctivae are normal.  Neck: Neck supple. No thyromegaly present.  Cardiovascular: Normal rate and regular rhythm.  Exam reveals no gallop.   No murmur heard. Pulmonary/Chest: Effort normal and breath sounds normal. No  respiratory distress.  Abdominal: He exhibits no distension and no mass. There is no tenderness.  Musculoskeletal: He exhibits no edema.  Neurological: He is alert and oriented to person, place, and time.  Skin: Skin is warm.  Psychiatric: Memory, affect and judgment normal.    Lab Results  Component Value Date  TSH 0.950 05/11/2013   Lab Results  Component Value Date   WBC 11.6* 05/11/2013   HGB 15.3 05/11/2013   HCT 42.4 05/11/2013   MCV 88.0 05/11/2013   PLT 194 05/11/2013   Lab Results  Component Value Date   CREATININE 1.24 05/11/2013   BUN 21 05/11/2013   NA 137 05/11/2013   K 4.5 05/11/2013   CL 101 05/11/2013   CO2 26 05/11/2013   Lab Results  Component Value Date   ALT 17 05/11/2013   AST 17 05/11/2013   ALKPHOS 62 05/11/2013   BILITOT 0.4 05/11/2013   Lab Results  Component Value Date   CHOL 151 05/11/2013   Lab Results  Component Value Date   HDL 27* 05/11/2013   Lab Results  Component Value Date   LDLCALC 73 05/11/2013   Lab Results  Component Value Date   TRIG 256* 05/11/2013   Lab Results  Component Value Date   CHOLHDL 5.6 05/11/2013     Assessment & Plan  Tobacco abuse Encouraged complete cessation  DM (diabetes mellitus) hgba1c improving, avoid simple carbs, continue current meds for now. asymptomatic  Acute low back pain with radicular symptoms, duration less than 6 weeks Tylenol and Flexeril help slightly, was doing some heavy lifting at the time and has done this before. Tylenol and Flexeril have been marginally helpful. Check xray today try [place] and given steroids and Robaxin to add, report worsening or persistent symptoms  HTN (hypertension) Add Maxzide recheck next month, can hold Lasix if feeling too dry

## 2013-05-16 NOTE — Assessment & Plan Note (Deleted)
hgba1c improving, avoid simple carbs, continue current meds for now. asymptomatic

## 2013-05-16 NOTE — Assessment & Plan Note (Signed)
Encouraged complete cessation. 

## 2013-05-31 ENCOUNTER — Other Ambulatory Visit: Payer: Self-pay | Admitting: Neurosurgery

## 2013-05-31 DIAGNOSIS — M5416 Radiculopathy, lumbar region: Secondary | ICD-10-CM

## 2013-06-09 ENCOUNTER — Other Ambulatory Visit: Payer: Self-pay

## 2013-06-10 ENCOUNTER — Ambulatory Visit
Admission: RE | Admit: 2013-06-10 | Discharge: 2013-06-10 | Disposition: A | Discharge status: Home / Self Care | Payer: Commercial Managed Care - PPO | Source: Ambulatory Visit | Attending: Neurosurgery | Admitting: Neurosurgery

## 2013-06-10 DIAGNOSIS — M5416 Radiculopathy, lumbar region: Secondary | ICD-10-CM

## 2013-06-10 MED ORDER — GADOBENATE DIMEGLUMINE 529 MG/ML IV SOLN
20.0000 mL | Freq: Once | INTRAVENOUS | Status: AC | PRN
  Administered 2013-06-10: 20 mL via INTRAVENOUS

## 2013-06-15 ENCOUNTER — Other Ambulatory Visit: Payer: Self-pay | Admitting: Family Medicine

## 2013-06-20 ENCOUNTER — Telehealth: Payer: Self-pay

## 2013-06-20 DIAGNOSIS — E785 Hyperlipidemia, unspecified: Secondary | ICD-10-CM

## 2013-06-20 DIAGNOSIS — I10 Essential (primary) hypertension: Secondary | ICD-10-CM

## 2013-06-20 DIAGNOSIS — E119 Type 2 diabetes mellitus without complications: Secondary | ICD-10-CM

## 2013-06-20 LAB — HEPATIC FUNCTION PANEL
ALT: 14 U/L (ref 0–53)
AST: 13 U/L (ref 0–37)
Alkaline Phosphatase: 62 U/L (ref 39–117)
Bilirubin, Direct: 0.1 mg/dL (ref 0.0–0.3)
Indirect Bilirubin: 0.3 mg/dL (ref 0.0–0.9)
Total Bilirubin: 0.4 mg/dL (ref 0.3–1.2)

## 2013-06-20 LAB — CBC
HCT: 42.8 % (ref 39.0–52.0)
Hemoglobin: 14.7 g/dL (ref 13.0–17.0)
MCV: 90.5 fL (ref 78.0–100.0)
RBC: 4.73 MIL/uL (ref 4.22–5.81)
RDW: 13.9 % (ref 11.5–15.5)
WBC: 10.9 10*3/uL — ABNORMAL HIGH (ref 4.0–10.5)

## 2013-06-20 LAB — LIPID PANEL
LDL Cholesterol: 75 mg/dL (ref 0–99)
Triglycerides: 378 mg/dL — ABNORMAL HIGH (ref <150)
VLDL: 76 mg/dL — ABNORMAL HIGH (ref 0–40)

## 2013-06-20 LAB — RENAL FUNCTION PANEL
CO2: 29 mEq/L (ref 19–32)
Chloride: 99 mEq/L (ref 96–112)
Creat: 1.33 mg/dL (ref 0.50–1.35)
Phosphorus: 3.1 mg/dL (ref 2.3–4.6)
Potassium: 4.8 mEq/L (ref 3.5–5.3)

## 2013-06-20 NOTE — Telephone Encounter (Signed)
Lab order placed.

## 2013-06-21 LAB — HEMOGLOBIN A1C: Hgb A1c MFr Bld: 9.4 % — ABNORMAL HIGH (ref <5.7)

## 2013-06-22 ENCOUNTER — Telehealth: Payer: Self-pay | Admitting: Family Medicine

## 2013-06-22 ENCOUNTER — Ambulatory Visit (INDEPENDENT_AMBULATORY_CARE_PROVIDER_SITE_OTHER): Payer: Commercial Managed Care - PPO | Admitting: Family Medicine

## 2013-06-22 ENCOUNTER — Encounter: Payer: Self-pay | Admitting: Family Medicine

## 2013-06-22 VITALS — BP 122/80 | HR 72 | Temp 98.4°F | Ht 74.0 in | Wt 255.0 lb

## 2013-06-22 DIAGNOSIS — Z23 Encounter for immunization: Secondary | ICD-10-CM

## 2013-06-22 DIAGNOSIS — I10 Essential (primary) hypertension: Secondary | ICD-10-CM

## 2013-06-22 DIAGNOSIS — E785 Hyperlipidemia, unspecified: Secondary | ICD-10-CM

## 2013-06-22 DIAGNOSIS — E119 Type 2 diabetes mellitus without complications: Secondary | ICD-10-CM

## 2013-06-22 DIAGNOSIS — IMO0002 Reserved for concepts with insufficient information to code with codable children: Secondary | ICD-10-CM

## 2013-06-22 DIAGNOSIS — M5410 Radiculopathy, site unspecified: Secondary | ICD-10-CM

## 2013-06-22 MED ORDER — METFORMIN HCL 500 MG PO TABS: 500.0000 mg | ORAL_TABLET | Freq: Two times a day (BID) | ORAL | Status: DC

## 2013-06-22 MED ORDER — CYCLOBENZAPRINE HCL 10 MG PO TABS: 10.0000 mg | ORAL_TABLET | Freq: Every evening | ORAL | Status: DC | PRN

## 2013-06-22 MED ORDER — METHOCARBAMOL 500 MG PO TABS: 500.0000 mg | ORAL_TABLET | Freq: Two times a day (BID) | ORAL | Status: DC | PRN

## 2013-06-22 MED ORDER — INSULIN LISPRO 100 UNIT/ML ~~LOC~~ SOLN: 10.0000 [IU] | Freq: Every day | SUBCUTANEOUS | Status: DC

## 2013-06-22 NOTE — Patient Instructions (Signed)
Call your insurance and see if they cover the shingles shot (Zostavax)    Diabetes Meal Planning Guide The diabetes meal planning guide is a tool to help you plan your meals and snacks. It is important for people with diabetes to manage their blood glucose (sugar) levels. Choosing the right foods and the right amounts throughout your day will help control your blood glucose. Eating right can even help you improve your blood pressure and reach or maintain a healthy weight. CARBOHYDRATE COUNTING MADE EASY When you eat carbohydrates, they turn to sugar. This raises your blood glucose level. Counting carbohydrates can help you control this level so you feel better. When you plan your meals by counting carbohydrates, you can have more flexibility in what you eat and balance your medicine with your food intake. Carbohydrate counting simply means adding up the total amount of carbohydrate grams in your meals and snacks. Try to eat about the same amount at each meal. Foods with carbohydrates are listed below. Each portion below is 1 carbohydrate serving or 15 grams of carbohydrates. Ask your dietician how many grams of carbohydrates you should eat at each meal or snack. Grains and Starches  1 slice bread.   English muffin or hotdog/hamburger bun.   cup cold cereal (unsweetened).   cup cooked pasta or rice.   cup starchy vegetables (corn, potatoes, peas, beans, winter squash).  1 tortilla (6 inches).   bagel.  1 waffle or pancake (size of a CD).   cup cooked cereal.  4 to 6 small crackers. *Whole grain is recommended. Fruit  1 cup fresh unsweetened berries, melon, papaya, pineapple.  1 small fresh fruit.   banana or mango.   cup fruit juice (4 oz unsweetened).   cup canned fruit in natural juice or water.  2 tbs dried fruit.  12 to 15 grapes or cherries. Milk and Yogurt  1 cup fat-free or 1% milk.  1 cup soy milk.  6 oz light yogurt with sugar-free sweetener.  6  oz low-fat soy yogurt.  6 oz plain yogurt. Vegetables  1 cup raw or  cup cooked is counted as 0 carbohydrates or a "free" food.  If you eat 3 or more servings at 1 meal, count them as 1 carbohydrate serving. Other Carbohydrates   oz chips or pretzels.   cup ice cream or frozen yogurt.   cup sherbet or sorbet.  2 inch square cake, no frosting.  1 tbs honey, sugar, jam, jelly, or syrup.  2 small cookies.  3 squares of graham crackers.  3 cups popcorn.  6 crackers.  1 cup broth-based soup.  Count 1 cup casserole or other mixed foods as 2 carbohydrate servings.  Foods with less than 20 calories in a serving may be counted as 0 carbohydrates or a "free" food. You may want to purchase a book or computer software that lists the carbohydrate gram counts of different foods. In addition, the nutrition facts panel on the labels of the foods you eat are a good source of this information. The label will tell you how big the serving size is and the total number of carbohydrate grams you will be eating per serving. Divide this number by 15 to obtain the number of carbohydrate servings in a portion. Remember, 1 carbohydrate serving equals 15 grams of carbohydrate. SERVING SIZES Measuring foods and serving sizes helps you make sure you are getting the right amount of food. The list below tells how big or small some common serving  sizes are.  1 oz.........4 stacked dice.  3 oz........Marland KitchenDeck of cards.  1 tsp.......Marland KitchenTip of little finger.  1 tbs......Marland KitchenMarland KitchenThumb.  2 tbs.......Marland KitchenGolf ball.   cup......Marland KitchenHalf of a fist.  1 cup.......Marland KitchenA fist. SAMPLE DIABETES MEAL PLAN Below is a sample meal plan that includes foods from the grain and starches, dairy, vegetable, fruit, and meat groups. A dietician can individualize a meal plan to fit your calorie needs and tell you the number of servings needed from each food group. However, controlling the total amount of carbohydrates in your meal or snack  is more important than making sure you include all of the food groups at every meal. You may interchange carbohydrate containing foods (dairy, starches, and fruits). The meal plan below is an example of a 2000 calorie diet using carbohydrate counting. This meal plan has 17 carbohydrate servings. Breakfast  1 cup oatmeal (2 carb servings).   cup light yogurt (1 carb serving).  1 cup blueberries (1 carb serving).   cup almonds. Snack  1 large apple (2 carb servings).  1 low-fat string cheese stick. Lunch  Chicken breast salad.  1 cup spinach.   cup chopped tomatoes.  2 oz chicken breast, sliced.  2 tbs low-fat [place] dressing.  12 whole-wheat crackers (2 carb servings).  12 to 15 grapes (1 carb serving).  1 cup low-fat milk (1 carb serving). Snack  1 cup carrots.   cup hummus (1 carb serving). Dinner  3 oz broiled salmon.  1 cup brown rice (3 carb servings). Snack  1  cups steamed broccoli (1 carb serving) drizzled with 1 tsp olive oil and lemon juice.  1 cup light pudding (2 carb servings). DIABETES MEAL PLANNING WORKSHEET Your dietician can use this worksheet to help you decide how many servings of foods and what types of foods are right for you.  BREAKFAST Food Group and Servings / Carb Servings Grain/Starches __________________________________ Dairy __________________________________________ Vegetable ______________________________________ Fruit ___________________________________________ Meat __________________________________________ Fat ____________________________________________ LUNCH Food Group and Servings / Carb Servings Grain/Starches ___________________________________ Dairy ___________________________________________ Fruit ____________________________________________ Meat ___________________________________________ Fat _____________________________________________ Gabriel [name] Food Group and Servings / Carb Servings Grain/Starches  ___________________________________ Dairy ___________________________________________ Fruit ____________________________________________ Meat ___________________________________________ Fat _____________________________________________ SNACKS Food Group and Servings / Carb Servings Grain/Starches ___________________________________ Dairy ___________________________________________ Vegetable _______________________________________ Fruit ____________________________________________ Meat ___________________________________________ Fat _____________________________________________ DAILY TOTALS Starches _________________________ Vegetable ________________________ Fruit ____________________________ Dairy ____________________________ Meat ____________________________ Fat ______________________________ Document Released: 07/01/2005 Document Revised: 12/27/2011 Document Reviewed: 05/12/2009 [employer] Patient Information 2014 [employer]

## 2013-06-22 NOTE — Assessment & Plan Note (Signed)
Well controlled, no changes 

## 2013-06-22 NOTE — Telephone Encounter (Signed)
Lab order placed.

## 2013-06-22 NOTE — Telephone Encounter (Signed)
Lab order week of 09-17-2013 Labs prior to visit, lipid, renal, cbc, hepatic, tsh, hgba1c prior

## 2013-06-24 ENCOUNTER — Encounter: Payer: Self-pay | Admitting: Family Medicine

## 2013-06-24 NOTE — Assessment & Plan Note (Signed)
Tolerating Pravastatin, continue, add krill oil and avoid trans fats.

## 2013-06-24 NOTE — Assessment & Plan Note (Signed)
Improving slowly but persistent, continue Flexeril prn, has been seen by Dr Dutch Quint recently had a MRI which confirmed significant pathology, will follow up with dr Dutch Quint

## 2013-06-24 NOTE — Assessment & Plan Note (Signed)
Unfortunate increase in hgba1c, minimize simple carbs, continue Lantus insulin and restart Metformin, add regular insulin 10 units to largest meal

## 2013-06-24 NOTE — Progress Notes (Signed)
Patient ID: Gabriel [name], male   DOB: [birth year], 62 y.o.   MRN: [id number] Gabriel [name] [id number] January 08, 1951 06/24/2013      Progress Note-Follow Up  Subjective  Chief Complaint  Chief Complaint  Patient presents with  . Follow-up    4 week  . Injections    flu    HPI  Patient is a 62 year old male in today for followup. Continues to have back pain and lower extremity pain. Is following with neurosurgery at this time is just an MRI and is waiting results. No illness since last visit. Blood sugars have been running in the 130s to 160s. Some polyuria and polydipsia are still present but unchanged. No chest pain, palpitations, shortness of. No recent fevers or chills. Taking medications as prescribed.  Past Medical History  Diagnosis Date  . Arthritis   . Depression   . History of chronic bronchitis     dx 2011  . Seasonal allergies   . Heart murmur   . Hypertension   . Diabetes mellitus, type 2   . Hyperlipidemia   . 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 Congestive heart failure     September 2011-CHF  . History of chicken pox   . Obesity, unspecified 11/27/2012  . Tobacco abuse 11/27/2012    Down from 3 ppd to 1/2 ppd  . Allergic state 02/08/2013  . Acute low back pain with radicular symptoms, duration less than 6 weeks 05/16/2013    Past Surgical History  Procedure Laterality Date  . Knee surgery  1976, 1989    right knee torn ligament repair  . Lumbar fusion  1982    L4-L5  . Revision total knee arthroplasty  2007    right knee replacement    Family History  Problem Relation Age of Onset  . Arthritis      mother/father/paternal grandparents  . Heart disease Father     pacemaker  . Hypertension Father   . Hypertension Paternal Grandfather   . Hypertension Brother   . Emotional abuse Mother   . Diabetes Brother      x 2  . Diabetes Paternal Grandmother     History   Social History  . Marital Status: Widowed     Spouse Name: N/A    Number of Children: N/A  . 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 Smoking status: Current Every Day Smoker -- 1.00 packs/day  . Smokeless tobacco: Never Used     Comment: smokes a pack per day-started in 1972  . Alcohol Use: No  . Drug Use: No  . Sexual Activity: Not on file   Other Topics Concern  . Not on file   Social History Narrative   Regular exercise-no   Caffeine Use-yes          Current Outpatient Prescriptions on File Prior to Visit  Medication Sig Dispense Refill  . albuterol (PROVENTIL HFA;VENTOLIN HFA) 108 (90 BASE) MCG/ACT inhaler Inhale 2 puffs into the lungs every 6 (six) hours as needed for wheezing.  1 Inhaler  0  . amitriptyline (ELAVIL) 10 MG tablet TAKE 1 TABLET AT BEDTIME  90 tablet  1  . amLODipine (NORVASC) 10 MG tablet Take 1 tablet (10 mg total) by mouth daily.  90 tablet  1  . aspirin 81 MG tablet Take 81 mg by mouth daily.        Marland Kitchen  carvedilol (COREG) 25 MG tablet TAKE 1 AND 1/2 TABLETS TWICE A DAY  270 tablet  1  . fluticasone (FLONASE) 50 MCG/ACT nasal spray Place 2 sprays into the nose daily as needed for rhinitis or allergies.  16 g  6  . furosemide (LASIX) 40 MG tablet TAKE 1 TABLET DAILY  90 tablet  1  . gemfibrozil (LOPID) 600 MG tablet Take 1 tablet (600 mg total) by mouth 2 (two) times daily before a meal.  180 tablet  1  . HYDROcodone-acetaminophen (NORCO/VICODIN) 5-325 MG per tablet Take 1-2 tablets by mouth every 6 (six) hours as needed for pain.  17 tablet  0  . insulin glargine (LANTUS) 100 UNIT/ML injection Inject 1.4 mLs (140 Units total) into the skin daily.  13 mL  prn  . Insulin Pen Needle 31G X 5 MM MISC USE AS DIRECTED ONE TIME DAILY.  100 each  prn  . Krill Oil CAPS MegaRed or a generic by schiff daily      . lisinopril (PRINIVIL,ZESTRIL) 40 MG tablet TAKE 1 TABLET DAILY  90 tablet  1  . Multiple Vitamin (MULTIVITAMIN) tablet Take 1  tablet by mouth daily.        . pravastatin (PRAVACHOL) 20 MG tablet Take 1 tablet (20 mg total) by mouth every evening.  90 tablet  1  . sertraline (ZOLOFT) 100 MG tablet Take 1.5 tablets (150 mg total) by mouth daily.  135 tablet  1  . triamterene-hydrochlorothiazide (MAXZIDE-25) 37.5-25 MG per tablet Take 1 tablet by mouth daily.  90 tablet  3  . vardenafil (LEVITRA) 10 MG tablet Take 10 mg by mouth daily as needed.       No current facility-administered medications on file prior to visit.    No Known Allergies  Review of Systems  Review of Systems  Constitutional: Negative for fever and malaise/fatigue.  HENT: Negative for congestion.   Eyes: Negative for discharge.  Respiratory: Negative for shortness of breath.   Cardiovascular: Negative for chest pain, palpitations and leg swelling.  Gastrointestinal: Negative for nausea, abdominal pain and diarrhea.  Genitourinary: Positive for frequency. Negative for dysuria.  Musculoskeletal: Positive for back pain and joint pain. Negative for falls.  Skin: Negative for rash.  Neurological: Negative for loss of consciousness and headaches.  Endo/Heme/Allergies: Negative for polydipsia.  Psychiatric/Behavioral: Negative for depression and suicidal ideas. The patient is not nervous/anxious and does not have insomnia.     Objective  BP 122/80  Pulse 72  Temp(Src) 98.4 F (36.9 C) (Oral)  Ht 6\' 2"  (1.88 m)  Wt 255 lb 0.6 oz (115.685 kg)  BMI 32.73 kg/m2  SpO2 97%  Physical Exam  Physical Exam  Constitutional: He is oriented to person, place, and time and well-developed, well-nourished, and in no distress. No distress.  HENT:  Head: Normocephalic and atraumatic.  Eyes: Conjunctivae are normal.  Neck: Neck supple. No thyromegaly present.  Cardiovascular: Normal rate, regular rhythm and normal heart sounds.   No murmur heard. Pulmonary/Chest: Effort normal and breath sounds normal. No respiratory distress.  Abdominal: He exhibits  no distension and no mass. There is no tenderness.  Musculoskeletal: He exhibits no edema.  Neurological: He is alert and oriented to person, place, and time.  Skin: Skin is warm.  Psychiatric: Memory, affect and judgment normal.    Lab Results  Component Value Date   TSH 0.891 06/20/2013   Lab Results  Component Value Date   WBC 10.9* 06/20/2013   HGB 14.7 06/20/2013  HCT 42.8 06/20/2013   MCV 90.5 06/20/2013   PLT 194 06/20/2013   Lab Results  Component Value Date   CREATININE 1.33 06/20/2013   BUN 28* 06/20/2013   NA 137 06/20/2013   K 4.8 06/20/2013   CL 99 06/20/2013   CO2 29 06/20/2013   Lab Results  Component Value Date   ALT 14 06/20/2013   AST 13 06/20/2013   ALKPHOS 62 06/20/2013   BILITOT 0.4 06/20/2013   Lab Results  Component Value Date   CHOL 181 06/20/2013   Lab Results  Component Value Date   HDL 30* 06/20/2013   Lab Results  Component Value Date   LDLCALC 75 06/20/2013   Lab Results  Component Value Date   TRIG 378* 06/20/2013   Lab Results  Component Value Date   CHOLHDL 6.0 06/20/2013     Assessment & Plan  HTN (hypertension) Well controlled, no changes  DM (diabetes mellitus) Unfortunate increase in hgba1c, minimize simple carbs, continue Lantus insulin and restart Metformin, add regular insulin 10 units to largest meal  Other and unspecified hyperlipidemia Tolerating Pravastatin, continue, add krill oil and avoid trans fats.   Acute low back pain with radicular symptoms, duration less than 6 weeks Improving slowly but persistent, continue Flexeril prn, has been seen by Dr Dutch Quint recently had a MRI which confirmed significant pathology, will follow up with dr Dutch Quint

## 2013-07-17 ENCOUNTER — Ambulatory Visit (INDEPENDENT_AMBULATORY_CARE_PROVIDER_SITE_OTHER): Payer: Commercial Managed Care - PPO | Admitting: Physician Assistant

## 2013-07-17 ENCOUNTER — Encounter: Payer: Self-pay | Admitting: Physician Assistant

## 2013-07-17 VITALS — BP 120/74 | HR 83 | Temp 98.2°F | Resp 20 | Ht 74.0 in | Wt 256.0 lb

## 2013-07-17 DIAGNOSIS — L03312 Cellulitis of back [any part except buttock]: Secondary | ICD-10-CM | POA: Insufficient documentation

## 2013-07-17 DIAGNOSIS — L0390 Cellulitis, unspecified: Secondary | ICD-10-CM

## 2013-07-17 DIAGNOSIS — L02219 Cutaneous abscess of trunk, unspecified: Secondary | ICD-10-CM

## 2013-07-17 DIAGNOSIS — L0291 Cutaneous abscess, unspecified: Secondary | ICD-10-CM

## 2013-07-17 MED ORDER — DOXYCYCLINE HYCLATE 100 MG PO TABS: 100.0000 mg | ORAL_TABLET | Freq: Two times a day (BID) | ORAL | Status: DC

## 2013-07-17 NOTE — Progress Notes (Signed)
Patient ID: Gabriel [name], male   DOB: 1950/11/22, 61 y.o.   MRN: [id number]  Patient presents to clinic today c/o bump on the right side of his back that is swollen, red, tender and hot to touch.  He first noticed this upon awakening this morning.  Denies insect bite.  States area is very tender and feels hard.  Denies drainage from site.  Denies fever, chills, sweats.  Denies history of cellulitis.  Denies similar lesion/bump elsewhere.  Past Medical History  Diagnosis Date  . Arthritis   . Depression   . History of chronic bronchitis     dx 2011  . Seasonal allergies   . Heart murmur   . Hypertension   . Diabetes mellitus, type 2   . Hyperlipidemia   . 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 Congestive heart failure     September 2011-CHF  . History of chicken pox   . Obesity, unspecified 11/27/2012  . Tobacco abuse 11/27/2012    Down from 3 ppd to 1/2 ppd  . Allergic state 02/08/2013  . Acute low back pain with radicular symptoms, duration less than 6 weeks 05/16/2013    Current Outpatient Prescriptions on File Prior to Visit  Medication Sig Dispense Refill  . albuterol (PROVENTIL HFA;VENTOLIN HFA) 108 (90 BASE) MCG/ACT inhaler Inhale 2 puffs into the lungs every 6 (six) hours as needed for wheezing.  1 Inhaler  0  . amitriptyline (ELAVIL) 10 MG tablet TAKE 1 TABLET AT BEDTIME  90 tablet  1  . amLODipine (NORVASC) 10 MG tablet Take 1 tablet (10 mg total) by mouth daily.  90 tablet  1  . aspirin 81 MG tablet Take 81 mg by mouth daily.        . carvedilol (COREG) 25 MG tablet TAKE 1 AND 1/2 TABLETS TWICE A DAY  270 tablet  1  . cyclobenzaprine (FLEXERIL) 10 MG tablet Take 1 tablet (10 mg total) by mouth at bedtime as needed for muscle spasms.  30 tablet  2  . fluticasone (FLONASE) 50 MCG/ACT nasal spray Place 2 sprays into the nose daily as needed for rhinitis or allergies.  16 g  6  . furosemide (LASIX) 40 MG tablet TAKE 1 TABLET DAILY  90  tablet  1  . gemfibrozil (LOPID) 600 MG tablet Take 1 tablet (600 mg total) by mouth 2 (two) times daily before a meal.  180 tablet  1  . HYDROcodone-acetaminophen (NORCO/VICODIN) 5-325 MG per tablet Take 1-2 tablets by mouth every 6 (six) hours as needed for pain.  17 tablet  0  . insulin glargine (LANTUS) 100 UNIT/ML injection Inject 1.4 mLs (140 Units total) into the skin daily.  13 mL  prn  . insulin lispro (HUMALOG) 100 UNIT/ML injection Inject 10 Units into the skin daily. With biggest meal  15 mL  1  . Insulin Pen Needle 31G X 5 MM MISC USE AS DIRECTED ONE TIME DAILY.  100 each  prn  . Krill Oil CAPS MegaRed or a generic by schiff daily      . lisinopril (PRINIVIL,ZESTRIL) 40 MG tablet TAKE 1 TABLET DAILY  90 tablet  1  . metFORMIN (GLUCOPHAGE) 500 MG tablet Take 1 tablet (500 mg total) by mouth 2 (two) times daily with a meal.  180 tablet  3  . methocarbamol (ROBAXIN) 500 MG tablet Take 1 tablet (500 mg total) by mouth 2 (two) times daily as needed (  pain, muscle spasm). During the day  60 tablet  1  . Multiple Vitamin (MULTIVITAMIN) tablet Take 1 tablet by mouth daily.        . pravastatin (PRAVACHOL) 20 MG tablet Take 1 tablet (20 mg total) by mouth every evening.  90 tablet  1  . sertraline (ZOLOFT) 100 MG tablet Take 1.5 tablets (150 mg total) by mouth daily.  135 tablet  1  . triamterene-hydrochlorothiazide (MAXZIDE-25) 37.5-25 MG per tablet Take 1 tablet by mouth daily.  90 tablet  3  . vardenafil (LEVITRA) 10 MG tablet Take 10 mg by mouth daily as needed.       No current facility-administered medications on file prior to visit.    No Known Allergies  Family History  Problem Relation Age of Onset  . Arthritis      mother/father/paternal grandparents  . Heart disease Father     pacemaker  . Hypertension Father   . Hypertension Paternal Grandfather   . Hypertension Brother   . Emotional abuse Mother   . Diabetes Brother      x 2  . Diabetes Paternal Grandmother      History   Social History  . Marital Status: Widowed    Spouse Name: N/A    Number of Children: N/A  . 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 Smoking status: Current Every Day Smoker -- 1.00 packs/day  . Smokeless tobacco: Never Used     Comment: smokes a pack per day-started in 1972  . Alcohol Use: No  . Drug Use: No  . Sexual Activity: None   Other Topics Concern  . None   Social History Narrative   Regular exercise-no   Caffeine Use-yes         ROS See HPI.  All other ROS negative.   Filed Vitals:   07/17/13 1552  BP: 120/74  Pulse: 83  Temp: 98.2 F (36.8 C)  Resp: 20   Physical Exam  Constitutional: He is oriented to person, place, and time and well-developed, well-nourished, and in no distress.  HENT:  Head: Normocephalic and atraumatic.  Eyes: Conjunctivae are normal.  Neck: Neck supple.  Cardiovascular: Normal rate, regular rhythm and normal heart sounds.   Pulmonary/Chest: Effort normal and breath sounds normal.  Lymphadenopathy:    He has no cervical adenopathy.  Neurological: He is alert and oriented to person, place, and time.  Skin: Skin is warm and dry.  Presence of 5 cm area of erythema, tenderness and warmth on right upper back without drainage or fluctuance.   Recent Results (from the past 2160 hour(s))  LIPID PANEL     Status: Abnormal   Collection Time    05/11/13  9:41 AM      Result Value Range   Cholesterol 151  0 - 200 mg/dL   Comment: ATP III Classification:           < 200        mg/dL        Desirable          200 - 239     mg/dL        Borderline High          >= 240        mg/dL        High         Triglycerides 256 (*) <150 mg/dL  HDL 27 (*) >39 mg/dL   Total CHOL/HDL Ratio 5.6     VLDL 51 (*) 0 - 40 mg/dL   LDL Cholesterol 73  0 - 99 mg/dL   Comment:       Total Cholesterol/HDL Ratio:CHD Risk                            Coronary Heart Disease  Risk Table                                            Men       Women              1/2 Average Risk              3.4        3.3                  Average Risk              5.0        4.4               2X Average Risk              9.6        7.1               3X Average Risk             23.4       11.0     Use the calculated Patient Ratio above and the CHD Risk table      to determine the patient's CHD Risk.     ATP III Classification (LDL):           < 100        mg/dL         Optimal          100 - 129     mg/dL         Near or Above Optimal          130 - 159     mg/dL         Borderline High          160 - 189     mg/dL         High           > 190        mg/dL         Very High        RENAL FUNCTION PANEL     Status: Abnormal   Collection Time    05/11/13  9:41 AM      Result Value Range   Sodium 137  135 - 145 mEq/L   Potassium 4.5  3.5 - 5.3 mEq/L   Chloride 101  96 - 112 mEq/L   CO2 26  19 - 32 mEq/L   Glucose, Bld 202 (*) 70 - 99 mg/dL   BUN 21  6 - 23 mg/dL   Creat 1.61  0.96 - 0.45 mg/dL   Albumin 4.4  3.5 - 5.2 g/dL   Calcium 9.8  8.4 - 40.9 mg/dL   Phosphorus 3.2  2.3 - 4.6 mg/dL  TSH     Status: None   Collection Time  05/11/13  9:41 AM      Result Value Range   TSH 0.950  0.350 - 4.500 uIU/mL  HEPATIC FUNCTION PANEL     Status: None   Collection Time    05/11/13  9:41 AM      Result Value Range   Total Bilirubin 0.4  0.3 - 1.2 mg/dL   Bilirubin, Direct 0.1  0.0 - 0.3 mg/dL   Indirect Bilirubin 0.3  0.0 - 0.9 mg/dL   Alkaline Phosphatase 62  39 - 117 U/L   AST 17  0 - 37 U/L   ALT 17  0 - 53 U/L   Total Protein 6.9  6.0 - 8.3 g/dL   Albumin 4.4  3.5 - 5.2 g/dL  CBC     Status: Abnormal   Collection Time    05/11/13  9:41 AM      Result Value Range   WBC 11.6 (*) 4.0 - 10.5 K/uL   RBC 4.82  4.22 - 5.81 MIL/uL   Hemoglobin 15.3  13.0 - 17.0 g/dL   HCT 16.1  09.6 - 04.5 %   MCV 88.0  78.0 - 100.0 fL   MCH 31.7  26.0 - 34.0 pg   MCHC 36.1 (*) 30.0 -  36.0 g/dL   RDW 40.9  81.1 - 91.4 %   Platelets 194  150 - 400 K/uL  HEMOGLOBIN A1C     Status: Abnormal   Collection Time    05/11/13  9:41 AM      Result Value Range   Hemoglobin A1C 7.5 (*) <5.7 %   Comment:                                                                            According to the ADA Clinical Practice Recommendations for 2011, when     HbA1c is used as a screening test:             >=6.5%   Diagnostic of Diabetes Mellitus                (if abnormal result is confirmed)           5.7-6.4%   Increased risk of developing Diabetes Mellitus           References:Diagnosis and Classification of Diabetes Mellitus,Diabetes     Care,2011,34(Suppl 1):S62-S69 and Standards of Medical Care in             Diabetes - 2011,Diabetes Care,2011,34 (Suppl 1):S11-S61.         Mean Plasma Glucose 169 (*) <117 mg/dL  LIPID PANEL     Status: Abnormal   Collection Time    06/20/13  5:10 PM      Result Value Range   Cholesterol 181  0 - 200 mg/dL   Comment: ATP III Classification:           < 200        mg/dL        Desirable          200 - 239     mg/dL        Borderline High          >= 240  mg/dL        High         Triglycerides 378 (*) <150 mg/dL   HDL 30 (*) >16 mg/dL   Total CHOL/HDL Ratio 6.0     VLDL 76 (*) 0 - 40 mg/dL   LDL Cholesterol 75  0 - 99 mg/dL   Comment:       Total Cholesterol/HDL Ratio:CHD Risk                            Coronary Heart Disease Risk Table                                            Men       Women              1/2 Average Risk              3.4        3.3                  Average Risk              5.0        4.4               2X Average Risk              9.6        7.1               3X Average Risk             23.4       11.0     Use the calculated Patient Ratio above and the CHD Risk table      to determine the patient's CHD Risk.     ATP III Classification (LDL):           < 100        mg/dL         Optimal          100 - 129      mg/dL         Near or Above Optimal          130 - 159     mg/dL         Borderline High          160 - 189     mg/dL         High           > 190        mg/dL         Very High        RENAL FUNCTION PANEL     Status: Abnormal   Collection Time    06/20/13  5:10 PM      Result Value Range   Sodium 137  135 - 145 mEq/L   Potassium 4.8  3.5 - 5.3 mEq/L   Chloride 99  96 - 112 mEq/L   CO2 29  19 - 32 mEq/L   Glucose, Bld 203 (*) 70 - 99 mg/dL   BUN 28 (*) 6 - 23 mg/dL   Creat 1.09  6.04 - 5.40 mg/dL   Albumin 5.0  3.5 - 5.2 g/dL   Calcium 98.1  8.4 - 19.1 mg/dL  Phosphorus 3.1  2.3 - 4.6 mg/dL  CBC     Status: Abnormal   Collection Time    06/20/13  5:10 PM      Result Value Range   WBC 10.9 (*) 4.0 - 10.5 K/uL   RBC 4.73  4.22 - 5.81 MIL/uL   Hemoglobin 14.7  13.0 - 17.0 g/dL   HCT 96.0  45.4 - 09.8 %   MCV 90.5  78.0 - 100.0 fL   MCH 31.1  26.0 - 34.0 pg   MCHC 34.3  30.0 - 36.0 g/dL   RDW 11.9  14.7 - 82.9 %   Platelets 194  150 - 400 K/uL  TSH     Status: None   Collection Time    06/20/13  5:10 PM      Result Value Range   TSH 0.891  0.350 - 4.500 uIU/mL  HEPATIC FUNCTION PANEL     Status: None   Collection Time    06/20/13  5:10 PM      Result Value Range   Total Bilirubin 0.4  0.3 - 1.2 mg/dL   Bilirubin, Direct 0.1  0.0 - 0.3 mg/dL   Indirect Bilirubin 0.3  0.0 - 0.9 mg/dL   Alkaline Phosphatase 62  39 - 117 U/L   AST 13  0 - 37 U/L   ALT 14  0 - 53 U/L   Total Protein 7.4  6.0 - 8.3 g/dL   Albumin 5.0  3.5 - 5.2 g/dL  HEMOGLOBIN F6O     Status: Abnormal   Collection Time    06/20/13  5:10 PM      Result Value Range   Hemoglobin A1C 9.4 (*) <5.7 %   Comment:                                                                            According to the ADA Clinical Practice Recommendations for 2011, when     HbA1c is used as a screening test:             >=6.5%   Diagnostic of Diabetes Mellitus                (if abnormal result is confirmed)            5.7-6.4%   Increased risk of developing Diabetes Mellitus           References:Diagnosis and Classification of Diabetes Mellitus,Diabetes     Care,2011,34(Suppl 1):S62-S69 and Standards of Medical Care in             Diabetes - 2011,Diabetes Care,2011,34 (Suppl 1):S11-S61.         Mean Plasma Glucose 223 (*) <117 mg/dL    Assessment/Plan: Cellulitis of back Rx doxycyline.  Zyrtec.  Cold compresses to help with pain and itch.  Tylenol for pain.  Return to clinic if symptoms are not improving or change in nature or severity.

## 2013-07-17 NOTE — Assessment & Plan Note (Signed)
Rx doxycyline.  Zyrtec.  Cold compresses to help with pain and itch.  Tylenol for pain.  Return to clinic if symptoms are not improving or change in nature or severity.

## 2013-07-17 NOTE — Patient Instructions (Signed)
Please take antibiotic as prescribed until all tablets are gone.  Take a daily probiotic.  Ice compresses for pain/itch.  Tylenol for pain.   Cellulitis Cellulitis is an infection of the skin and the tissue beneath it. The infected area is usually red and tender. Cellulitis occurs most often in the arms and lower legs.  CAUSES  Cellulitis is caused by bacteria that enter the skin through cracks or cuts in the skin. The most common types of bacteria that cause cellulitis are Staphylococcus and Streptococcus. SYMPTOMS   Redness and warmth.  Swelling.  Tenderness or pain.  Fever. DIAGNOSIS  Your caregiver can usually determine what is wrong based on a physical exam. Blood tests may also be done. TREATMENT  Treatment usually involves taking an antibiotic medicine. HOME CARE INSTRUCTIONS   Take your antibiotics as directed. Finish them even if you start to feel better.  Keep the infected arm or leg elevated to reduce swelling.  Apply a warm cloth to the affected area up to 4 times per day to relieve pain.  Only take over-the-counter or prescription medicines for pain, discomfort, or fever as directed by your caregiver.  Keep all follow-up appointments as directed by your caregiver. SEEK MEDICAL CARE IF:   You notice red streaks coming from the infected area.  Your red area gets larger or turns dark in color.  Your bone or joint underneath the infected area becomes painful after the skin has healed.  Your infection returns in the same area or another area.  You notice a swollen bump in the infected area.  You develop new symptoms. SEEK IMMEDIATE MEDICAL CARE IF:   You have a fever.  You feel very sleepy.  You develop vomiting or diarrhea.  You have a general ill feeling (malaise) with muscle aches and pains. MAKE SURE YOU:   Understand these instructions.  Will watch your condition.  Will get help right away if you are not doing well or get worse. Document  Released: 07/14/2005 Document Revised: 04/04/2012 Document Reviewed: 12/20/2011 [facility] Patient Information 2014 [place], [place].

## 2013-07-25 ENCOUNTER — Telehealth: Payer: Self-pay | Admitting: *Deleted

## 2013-07-25 NOTE — Telephone Encounter (Signed)
Pt called to report that the cyst on his side [unknown if insect bite-shingles] is not hard anymore, is w/o pain and/or heat; but is still "size of hen egg"/SLS Per [facility], returned call & [facility] with contact name and number for return call RE: scheduling appt, if pt would like to have reassessed/SLS

## 2013-08-01 ENCOUNTER — Ambulatory Visit: Payer: Self-pay | Admitting: Cardiology

## 2013-08-30 NOTE — Telephone Encounter (Signed)
Completed.

## 2013-09-03 ENCOUNTER — Other Ambulatory Visit: Payer: Self-pay | Admitting: Family Medicine

## 2013-09-17 ENCOUNTER — Telehealth: Payer: Self-pay

## 2013-09-17 DIAGNOSIS — E785 Hyperlipidemia, unspecified: Secondary | ICD-10-CM

## 2013-09-17 DIAGNOSIS — I10 Essential (primary) hypertension: Secondary | ICD-10-CM

## 2013-09-17 DIAGNOSIS — E119 Type 2 diabetes mellitus without complications: Secondary | ICD-10-CM

## 2013-09-17 LAB — HEMOGLOBIN A1C
Hgb A1c MFr Bld: 8.3 % — ABNORMAL HIGH (ref <5.7)
Mean Plasma Glucose: 192 mg/dL — ABNORMAL HIGH (ref <117)

## 2013-09-17 LAB — LIPID PANEL
HDL: 25 mg/dL — ABNORMAL LOW (ref >39)
Total CHOL/HDL Ratio: 6.4 Ratio
Triglycerides: 425 mg/dL — ABNORMAL HIGH (ref <150)

## 2013-09-17 LAB — TSH: TSH: 1.136 u[IU]/mL (ref 0.350–4.500)

## 2013-09-17 LAB — HEPATIC FUNCTION PANEL
ALT: 17 U/L (ref 0–53)
Bilirubin, Direct: 0.1 mg/dL (ref 0.0–0.3)
Total Protein: 7.2 g/dL (ref 6.0–8.3)

## 2013-09-17 LAB — RENAL FUNCTION PANEL
Albumin: 4.7 g/dL (ref 3.5–5.2)
CO2: 29 mEq/L (ref 19–32)
Calcium: 9.9 mg/dL (ref 8.4–10.5)
Creat: 1.55 mg/dL — ABNORMAL HIGH (ref 0.50–1.35)
Sodium: 133 mEq/L — ABNORMAL LOW (ref 135–145)

## 2013-09-17 LAB — CBC
MCV: 91.1 fL (ref 78.0–100.0)
Platelets: 189 10*3/uL (ref 150–400)
RBC: 4.26 MIL/uL (ref 4.22–5.81)
RDW: 14.3 % (ref 11.5–15.5)
WBC: 9.8 10*3/uL (ref 4.0–10.5)

## 2013-09-17 NOTE — Telephone Encounter (Signed)
Patient came in today to have labs drawn. Please advise which labs need to be drawn?

## 2013-09-17 NOTE — Telephone Encounter (Signed)
Needs lipid, renal, cbc, tsh, hepatic, hgba1c and if it is an annual exam a PSA as well

## 2013-09-17 NOTE — Telephone Encounter (Signed)
Labs ordered. Its a follow up appt

## 2013-09-18 ENCOUNTER — Other Ambulatory Visit: Payer: Self-pay | Admitting: Family Medicine

## 2013-09-18 ENCOUNTER — Telehealth: Payer: Self-pay | Admitting: *Deleted

## 2013-09-18 DIAGNOSIS — I10 Essential (primary) hypertension: Secondary | ICD-10-CM

## 2013-09-18 DIAGNOSIS — E119 Type 2 diabetes mellitus without complications: Secondary | ICD-10-CM

## 2013-09-18 DIAGNOSIS — E785 Hyperlipidemia, unspecified: Secondary | ICD-10-CM

## 2013-09-18 MED ORDER — SITAGLIPTIN PHOS-METFORMIN HCL 50-500 MG PO TABS: 1.0000 | ORAL_TABLET | Freq: Two times a day (BID) | ORAL | Status: DC

## 2013-09-18 NOTE — Telephone Encounter (Signed)
Received request from [facility] for refills of furosemide and lisinopril.  Upon review of chart, refill was sent 04/30/13, #90 x 1 refill of each. Supply should last pt until January.  Denial sent to pharmacy to use refill on file.

## 2013-09-18 NOTE — Telephone Encounter (Deleted)
Message copied by Baldwin Jamaica on Tue Sep 18, 2013 11:10 AM ------      Message from: Danise Edge A      Created: Tue Sep 18, 2013 10:17 AM       His labs are off enough he should probably change meds and come in in next couple of weeks. Drop Lisinopril to 20 mg daily. change Metformin to Janumet 50/500 1 tab po bid, disp #60, refills. Creatinine is up some, dropping the Lisinopril should help will recheck at visit. Sodium is down some likely due to sugar, will recheck at visit. Sugar is actually slightly improved. ------

## 2013-09-18 NOTE — Telephone Encounter (Signed)
Attempted to contact patient, left message to return my call so that we could further discuss labs and changes to current regime.

## 2013-09-18 NOTE — Addendum Note (Signed)
Addended by: Court Joy on: 09/18/2013 12:37 PM   Modules accepted: Orders

## 2013-09-19 ENCOUNTER — Encounter: Payer: Self-pay | Admitting: Cardiology

## 2013-09-19 ENCOUNTER — Ambulatory Visit (INDEPENDENT_AMBULATORY_CARE_PROVIDER_SITE_OTHER): Payer: Commercial Managed Care - PPO | Admitting: Cardiology

## 2013-09-19 VITALS — BP 152/84 | HR 73 | Ht 72.0 in | Wt 254.0 lb

## 2013-09-19 DIAGNOSIS — I10 Essential (primary) hypertension: Secondary | ICD-10-CM

## 2013-09-19 DIAGNOSIS — E785 Hyperlipidemia, unspecified: Secondary | ICD-10-CM | POA: Insufficient documentation

## 2013-09-19 DIAGNOSIS — R079 Chest pain, unspecified: Secondary | ICD-10-CM

## 2013-09-19 DIAGNOSIS — I350 Nonrheumatic aortic (valve) stenosis: Secondary | ICD-10-CM | POA: Insufficient documentation

## 2013-09-19 DIAGNOSIS — R9431 Abnormal electrocardiogram [ECG] [EKG]: Secondary | ICD-10-CM

## 2013-09-19 DIAGNOSIS — F17200 Nicotine dependence, unspecified, uncomplicated: Secondary | ICD-10-CM

## 2013-09-19 DIAGNOSIS — Z720 Tobacco use: Secondary | ICD-10-CM

## 2013-09-19 DIAGNOSIS — I359 Nonrheumatic aortic valve disorder, unspecified: Secondary | ICD-10-CM

## 2013-09-19 NOTE — Assessment & Plan Note (Signed)
Symptoms atypical multiple risk factors and also with aortic stenosis. Plan stress Myoview for risk stratification.

## 2013-09-19 NOTE — Assessment & Plan Note (Signed)
Continue statin. 

## 2013-09-19 NOTE — Progress Notes (Signed)
HPI: 62 year old male for evaluation of murmur. Echocardiogram in February of 2013 showed normal LV function, grade 1 diastolic dysfunction, mild left atrial enlargement and moderate aortic stenosis with a mean gradient of 24 mm of mercury. Recent laboratories show a BUN of 34 and creatinine 1.55. Normal hemoglobin, TSH and liver functions. Patient has a history of murmur. He apparently had congestive heart failure this 3 years ago at [facility]. He had a cardiac catheterization and states he did not require any intervention. I do not have those records available. At present he denies dyspnea on exertion, orthopnea, PND, pedal edema, syncope or exertional chest pain. He occasionally has a pain on the right side of his chest for seconds not related to exertion. He had 2 episodes of a balloon sensation in his chest walking up stairs that lasted for one to 2 seconds.  Current Outpatient Prescriptions  Medication Sig Dispense Refill  . albuterol (PROVENTIL HFA;VENTOLIN HFA) 108 (90 BASE) MCG/ACT inhaler Inhale 2 puffs into the lungs every 6 (six) hours as needed for wheezing.  1 Inhaler  0  . amitriptyline (ELAVIL) 10 MG tablet TAKE 1 TABLET AT BEDTIME  90 tablet  1  . amLODipine (NORVASC) 10 MG tablet Take 1 tablet (10 mg total) by mouth daily.  90 tablet  1  . aspirin 81 MG tablet Take 81 mg by mouth daily.        . carvedilol (COREG) 25 MG tablet TAKE 1 AND 1/2 TABLETS TWICE A DAY  270 tablet  1  . cyclobenzaprine (FLEXERIL) 10 MG tablet Take 1 tablet (10 mg total) by mouth at bedtime as needed for muscle spasms.  30 tablet  2  . fluticasone (FLONASE) 50 MCG/ACT nasal spray Place 2 sprays into the nose daily as needed for rhinitis or allergies.  16 g  6  . furosemide (LASIX) 40 MG tablet TAKE 1 TABLET DAILY  90 tablet  1  . gemfibrozil (LOPID) 600 MG tablet Take 1 tablet (600 mg total) by mouth 2 (two) times daily before a meal.  180 tablet  1  . HYDROcodone-acetaminophen (NORCO/VICODIN)  5-325 MG per tablet Take 1-2 tablets by mouth every 6 (six) hours as needed for pain.      Marland Kitchen insulin glargine (LANTUS) 100 UNIT/ML injection Inject 1.4 mLs (140 Units total) into the skin daily.  13 mL  prn  . insulin lispro (HUMALOG) 100 UNIT/ML injection Inject 10 Units into the skin daily. With biggest meal  15 mL  1  . Insulin Pen Needle 31G X 5 MM MISC USE AS DIRECTED ONE TIME DAILY.  100 each  prn  . Krill Oil CAPS MegaRed or a generic by schiff daily      . lisinopril (PRINIVIL,ZESTRIL) 40 MG tablet Take 1/2 tablet daily      . methocarbamol (ROBAXIN) 500 MG tablet Take 1 tablet (500 mg total) by mouth 2 (two) times daily as needed (pain, muscle spasm). During the day  60 tablet  1  . Multiple Vitamin (MULTIVITAMIN) tablet Take 1 tablet by mouth daily.        . pravastatin (PRAVACHOL) 20 MG tablet Take 1 tablet (20 mg total) by mouth every evening.  90 tablet  1  . sertraline (ZOLOFT) 100 MG tablet Take 1.5 tablets (150 mg total) by mouth daily.  135 tablet  1  . sitaGLIPtin-metformin (JANUMET) 50-500 MG per tablet Take 1 tablet by mouth 2 (two) times daily with a meal.  60 tablet  0  . triamterene-hydrochlorothiazide (MAXZIDE-25) 37.5-25 MG per tablet Take 1 tablet by mouth daily.  90 tablet  3  . vardenafil (LEVITRA) 10 MG tablet Take 10 mg by mouth daily as needed.       No current facility-administered medications for this visit.    No Known Allergies  Past Medical History  Diagnosis Date  . Arthritis   . Depression   . History of chronic bronchitis     dx 2011  . Seasonal allergies   . Aortic stenosis   . Hypertension   . Diabetes mellitus, type 2   . Hyperlipidemia   . History of stroke 1992    blood clot at base of brain-[facility]  . Congestive heart failure     September 2011-CHF  . History of chicken pox   . Obesity, unspecified 11/27/2012  . Tobacco abuse 11/27/2012    Down from 3 ppd to 1/2 ppd  . Allergic state 02/08/2013  . Acute low back pain with  radicular symptoms, duration less than 6 weeks 05/16/2013    Past Surgical History  Procedure Laterality Date  . Knee surgery  1976, 1989    right knee torn ligament repair  . Lumbar fusion  1982    L4-L5  . Revision total knee arthroplasty  2007    right knee replacement    History   Social History  . Marital Status: Widowed    Spouse Name: N/A    Number of Children: 2  . 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 Smoking status: Current Every Day Smoker -- 1.00 packs/day  . Smokeless tobacco: Never Used     Comment: smokes a pack per day-started in 1972  . Alcohol Use: No  . Drug Use: No  . Sexual Activity: Not on file   Other Topics Concern  . Not on file   Social History Narrative   Regular exercise-no   Caffeine Use-yes          Family History  Problem Relation Age of Onset  . Arthritis      mother/father/paternal grandparents  . Heart disease Father     pacemaker  . Hypertension Father   . Hypertension Paternal Grandfather   . Hypertension Brother   . Emotional abuse Mother   . Diabetes Brother      x 2  . Diabetes Paternal Grandmother     ROS: no fevers or chills, productive cough, hemoptysis, dysphasia, odynophagia, melena, hematochezia, dysuria, hematuria, rash, seizure activity, orthopnea, PND, pedal edema, claudication. Remaining systems are negative.  Physical Exam:   Blood pressure 152/84, pulse 73, height 6' (1.829 m), weight 254 lb (115.214 kg).  General:  Well developed/well nourished in NAD Skin warm/dry Patient not depressed No peripheral clubbing Back-normal HEENT-normal/normal eyelids Neck supple/normal carotid upstroke bilaterally; no bruits; no JVD; no thyromegaly chest - CTA/ normal expansion CV - RRR/normal S1 and S2; no rubs or gallops;  PMI nondisplaced; 3/6 systolic murmur left sternal border radiating to the carotids. S2 mildly diminished. Abdomen  -NT/ND, no HSM, no mass, + bowel sounds, no bruit 2+ femoral pulses, no bruits Ext-no edema, chords, 2+ DP Neuro-grossly nonfocal  ECG sinus rhythm at a rate of 73. No ST changes.

## 2013-09-19 NOTE — Patient Instructions (Signed)
Your physician recommends that you schedule a follow-up appointment in: 6-8 WEEKS WITH DR Jens Som IN [place]  Your physician has requested that you have an echocardiogram. Echocardiography is a painless test that uses sound waves to create images of your heart. It provides your doctor with information about the size and shape of your heart and how well your heart's chambers and valves are working. This procedure takes approximately one hour. There are no restrictions for this procedure.   Your physician has requested that you have en exercise stress myoview. For further information please visit [email or web address] Please follow instruction sheet, as given.

## 2013-09-19 NOTE — Assessment & Plan Note (Signed)
Patient counseled on discontinuing. 

## 2013-09-19 NOTE — Assessment & Plan Note (Signed)
Patient has aortic stenosis on examination. He is noted to have moderate aortic stenosis on previous echo. I have explained that she will most likely require aortic valve replacement in the future. We will repeat his echocardiogram now. I will obtain records from [facility] concerning his previous catheterization.

## 2013-09-19 NOTE — Assessment & Plan Note (Signed)
Blood pressure mildly elevated but he follows this at home and it is typically controlled. Continue present medications.

## 2013-09-20 ENCOUNTER — Telehealth: Payer: Self-pay | Admitting: Cardiology

## 2013-09-20 ENCOUNTER — Encounter: Payer: Self-pay | Admitting: Family Medicine

## 2013-09-20 ENCOUNTER — Ambulatory Visit (INDEPENDENT_AMBULATORY_CARE_PROVIDER_SITE_OTHER): Payer: Commercial Managed Care - PPO | Admitting: Family Medicine

## 2013-09-20 VITALS — BP 110/84 | HR 68 | Temp 97.7°F | Ht 73.0 in | Wt 254.0 lb

## 2013-09-20 DIAGNOSIS — I10 Essential (primary) hypertension: Secondary | ICD-10-CM

## 2013-09-20 DIAGNOSIS — IMO0002 Reserved for concepts with insufficient information to code with codable children: Secondary | ICD-10-CM

## 2013-09-20 DIAGNOSIS — I350 Nonrheumatic aortic (valve) stenosis: Secondary | ICD-10-CM

## 2013-09-20 DIAGNOSIS — E119 Type 2 diabetes mellitus without complications: Secondary | ICD-10-CM

## 2013-09-20 DIAGNOSIS — M5410 Radiculopathy, site unspecified: Secondary | ICD-10-CM

## 2013-09-20 DIAGNOSIS — E785 Hyperlipidemia, unspecified: Secondary | ICD-10-CM

## 2013-09-20 DIAGNOSIS — Z23 Encounter for immunization: Secondary | ICD-10-CM

## 2013-09-20 DIAGNOSIS — Z720 Tobacco use: Secondary | ICD-10-CM

## 2013-09-20 DIAGNOSIS — I359 Nonrheumatic aortic valve disorder, unspecified: Secondary | ICD-10-CM

## 2013-09-20 DIAGNOSIS — E1110 Type 2 diabetes mellitus with ketoacidosis without coma: Secondary | ICD-10-CM

## 2013-09-20 DIAGNOSIS — F17200 Nicotine dependence, unspecified, uncomplicated: Secondary | ICD-10-CM

## 2013-09-20 DIAGNOSIS — E1310 Other specified diabetes mellitus with ketoacidosis without coma: Secondary | ICD-10-CM

## 2013-09-20 MED ORDER — SITAGLIPTIN PHOS-METFORMIN HCL 50-1000 MG PO TABS: 1.0000 | ORAL_TABLET | Freq: Two times a day (BID) | ORAL | Status: DC

## 2013-09-20 MED ORDER — FUROSEMIDE 40 MG PO TABS: 20.0000 mg | ORAL_TABLET | Freq: Every day | ORAL | Status: DC

## 2013-09-20 NOTE — Progress Notes (Signed)
Pre visit review using our clinic review tool, if applicable. No additional management support is needed unless otherwise documented below in the visit note. 

## 2013-09-20 NOTE — Telephone Encounter (Signed)
ROI faxed to [facility]  °

## 2013-09-20 NOTE — Telephone Encounter (Signed)
Lab order week of 12-12-2013 Then labs just prior lipid, renal, tsh, cbc, hga1c, hepatic

## 2013-09-20 NOTE — Progress Notes (Signed)
Patient ID: Terrion Gencarelli, male   DOB: 01/20/1951, 62 y.o.   MRN: [id number] Hanna Aultman [id number] 12-17-1950 09/20/2013      Progress Note-Follow Up  Subjective  Chief Complaint  Chief Complaint  Patient presents with  . Follow-up  . Injections    prevnar    HPI  Patient is a 62 year old male who is in today for followup. Is doing somewhat better. He has been seen by his neurosurgeon and physical therapy was recommended but as his pain improves he chose not to proceed with physical therapy for financial concerns. He has no significant radicular symptoms at this time. No incontinence. He is presently following with cardiology and is awaiting further workup. Feels well. Denies any chest pain, palpitations or shortness or breath at this time. Taking medications as prescribed  Past Medical History  Diagnosis Date  . Arthritis   . Depression   . History of chronic bronchitis     dx 2011  . Seasonal allergies   . Aortic stenosis   . Hypertension   . Diabetes mellitus, type 2   . Hyperlipidemia   . History of stroke 1992    blood clot at base of brain-[facility]  . Congestive heart failure     September 2011-CHF  . History of chicken pox   . Obesity, unspecified 11/27/2012  . Tobacco abuse 11/27/2012    Down from 3 ppd to 1/2 ppd  . Allergic state 02/08/2013  . Acute low back pain with radicular symptoms, duration less than 6 weeks 05/16/2013    Past Surgical History  Procedure Laterality Date  . Knee surgery  1976, 1989    right knee torn ligament repair  . Lumbar fusion  1982    L4-L5  . Revision total knee arthroplasty  2007    right knee replacement    Family History  Problem Relation Age of Onset  . Arthritis      mother/father/paternal grandparents  . Heart disease Father     pacemaker  . Hypertension Father   . Hypertension Paternal Grandfather   . Hypertension Brother   . Emotional abuse Mother   . Diabetes Brother      x 2  . Diabetes Paternal  Grandmother     History   Social History  . Marital Status: Widowed    Spouse Name: N/A    Number of Children: 2  . 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 Smoking status: Current Every Day Smoker -- 1.00 packs/day  . Smokeless tobacco: Never Used     Comment: smokes a pack per day-started in 1972  . Alcohol Use: No  . Drug Use: No  . Sexual Activity: Not on file   Other Topics Concern  . Not on file   Social History Narrative   Regular exercise-no   Caffeine Use-yes          Current Outpatient Prescriptions on File Prior to Visit  Medication Sig Dispense Refill  . albuterol (PROVENTIL HFA;VENTOLIN HFA) 108 (90 BASE) MCG/ACT inhaler Inhale 2 puffs into the lungs every 6 (six) hours as needed for wheezing.  1 Inhaler  0  . amitriptyline (ELAVIL) 10 MG tablet TAKE 1 TABLET AT BEDTIME  90 tablet  1  . amLODipine (NORVASC) 10 MG tablet Take 1 tablet (10 mg total) by mouth daily.  90 tablet  1  . aspirin 81 MG tablet Take 81  mg by mouth daily.        . carvedilol (COREG) 25 MG tablet TAKE 1 AND 1/2 TABLETS TWICE A DAY  270 tablet  1  . cyclobenzaprine (FLEXERIL) 10 MG tablet Take 1 tablet (10 mg total) by mouth at bedtime as needed for muscle spasms.  30 tablet  2  . fluticasone (FLONASE) 50 MCG/ACT nasal spray Place 2 sprays into the nose daily as needed for rhinitis or allergies.  16 g  6  . furosemide (LASIX) 40 MG tablet TAKE 1 TABLET DAILY  90 tablet  1  . gemfibrozil (LOPID) 600 MG tablet Take 1 tablet (600 mg total) by mouth 2 (two) times daily before a meal.  180 tablet  1  . HYDROcodone-acetaminophen (NORCO/VICODIN) 5-325 MG per tablet Take 1-2 tablets by mouth every 6 (six) hours as needed for pain.      Marland Kitchen insulin glargine (LANTUS) 100 UNIT/ML injection Inject 1.4 mLs (140 Units total) into the skin daily.  13 mL  prn  . insulin lispro (HUMALOG) 100 UNIT/ML injection Inject 10 Units into the skin  daily. With biggest meal  15 mL  1  . Insulin Pen Needle 31G X 5 MM MISC USE AS DIRECTED ONE TIME DAILY.  100 each  prn  . Krill Oil CAPS MegaRed or a generic by schiff daily      . lisinopril (PRINIVIL,ZESTRIL) 40 MG tablet Take 1/2 tablet daily      . methocarbamol (ROBAXIN) 500 MG tablet Take 1 tablet (500 mg total) by mouth 2 (two) times daily as needed (pain, muscle spasm). During the day  60 tablet  1  . Multiple Vitamin (MULTIVITAMIN) tablet Take 1 tablet by mouth daily.        . pravastatin (PRAVACHOL) 20 MG tablet Take 1 tablet (20 mg total) by mouth every evening.  90 tablet  1  . sertraline (ZOLOFT) 100 MG tablet Take 1.5 tablets (150 mg total) by mouth daily.  135 tablet  1  . sitaGLIPtin-metformin (JANUMET) 50-500 MG per tablet Take 1 tablet by mouth 2 (two) times daily with a meal.  60 tablet  0  . triamterene-hydrochlorothiazide (MAXZIDE-25) 37.5-25 MG per tablet Take 1 tablet by mouth daily.  90 tablet  3  . vardenafil (LEVITRA) 10 MG tablet Take 10 mg by mouth daily as needed.       No current facility-administered medications on file prior to visit.    No Known Allergies  Review of Systems  Review of Systems  Constitutional: Negative for fever and malaise/fatigue.  HENT: Negative for congestion.   Eyes: Negative for discharge.  Respiratory: Negative for shortness of breath.   Cardiovascular: Negative for chest pain, palpitations and leg swelling.  Gastrointestinal: Negative for nausea, abdominal pain and diarrhea.  Genitourinary: Negative for dysuria.  Musculoskeletal: Negative for falls.  Skin: Negative for rash.  Neurological: Negative for loss of consciousness and headaches.  Endo/Heme/Allergies: Negative for polydipsia.  Psychiatric/Behavioral: Negative for depression and suicidal ideas. The patient is not nervous/anxious and does not have insomnia.     Objective  BP 110/84  Pulse 68  Temp(Src) 97.7 F (36.5 C) (Oral)  Ht 6\' 1"  (1.854 m)  Wt 254 lb 0.6  oz (115.232 kg)  BMI 33.52 kg/m2  SpO2 97%  Physical Exam  Physical Exam  Constitutional: He is oriented to person, place, and time and well-developed, well-nourished, and in no distress. No distress.  HENT:  Head: Normocephalic and atraumatic.  Eyes: Conjunctivae are normal.  Neck: Neck supple. No thyromegaly present.  Cardiovascular: Normal rate, regular rhythm and normal heart sounds.   No murmur heard. Pulmonary/Chest: Effort normal and breath sounds normal. No respiratory distress.  Abdominal: He exhibits no distension and no mass. There is no tenderness.  Musculoskeletal: He exhibits no edema.  Neurological: He is alert and oriented to person, place, and time.  Skin: Skin is warm.  Psychiatric: Memory, affect and judgment normal.    Lab Results  Component Value Date   TSH 1.136 09/17/2013   Lab Results  Component Value Date   WBC 9.8 09/17/2013   HGB 13.6 09/17/2013   HCT 38.8* 09/17/2013   MCV 91.1 09/17/2013   PLT 189 09/17/2013   Lab Results  Component Value Date   CREATININE 1.55* 09/17/2013   BUN 34* 09/17/2013   NA 133* 09/17/2013   K 4.3 09/17/2013   CL 98 09/17/2013   CO2 29 09/17/2013   Lab Results  Component Value Date   ALT 17 09/17/2013   AST 18 09/17/2013   ALKPHOS 68 09/17/2013   BILITOT 0.4 09/17/2013   Lab Results  Component Value Date   CHOL 159 09/17/2013   Lab Results  Component Value Date   HDL 25* 09/17/2013   Lab Results  Component Value Date   LDLCALC Comment:   Not calculated due to Triglyceride >400. Suggest ordering Direct LDL (Unit Code: 62130).   Total Cholesterol/HDL Ratio:CHD Risk                        Coronary Heart Disease Risk Table                                        Men       Women          1/2 Average Risk              3.4        3.3              Average Risk              5.0        4.4           2X Average Risk              9.6        7.1           3X Average Risk             23.4       11.0 Use the calculated Patient Ratio  above and the CHD Risk table  to determine the patient's CHD Risk. ATP III Classification (LDL):       < 100        mg/dL         Optimal      865 - 129     mg/dL         Near or Above Optimal      130 - 159     mg/dL         Borderline High      160 - 189     mg/dL         High       > 784        mg/dL  Very High   09/17/2013   Lab Results  Component Value Date   TRIG 425* 09/17/2013   Lab Results  Component Value Date   CHOLHDL 6.4 09/17/2013     Assessment & Plan  HTN (hypertension) Well controlled no changes.   DM (diabetes mellitus) Improved but still inadequate control, will increase Janumet to 50/1000 bid and minimize simple carbs  Other and unspecified hyperlipidemia Tolerating Pravastatin and Gemfibrozil, triglycerides are still up but will address sugar and reassess at next visit.  Aortic stenosis Is following Cardiology now and is awaiting further work up. No c/o at this time  Acute low back pain with radicular symptoms, duration less than 6 weeks Doing better, has chosen not to proceed with physical therapy at this time  Tobacco abuse Encouraged cessation

## 2013-09-20 NOTE — Patient Instructions (Signed)

## 2013-09-21 ENCOUNTER — Telehealth: Payer: Self-pay | Admitting: Cardiology

## 2013-09-21 ENCOUNTER — Encounter: Payer: Self-pay | Admitting: *Deleted

## 2013-09-21 NOTE — Telephone Encounter (Signed)
Records rec From [facility] gave to Debra M  °

## 2013-09-22 NOTE — Telephone Encounter (Signed)
Lab order placed.

## 2013-09-22 NOTE — Assessment & Plan Note (Signed)
Encouraged cessation.

## 2013-09-22 NOTE — Assessment & Plan Note (Addendum)
Doing better, has chosen not to proceed with physical therapy at this time

## 2013-09-22 NOTE — Assessment & Plan Note (Signed)
Well controlled no changes 

## 2013-09-22 NOTE — Assessment & Plan Note (Signed)
Improved but still inadequate control, will increase Janumet to 50/1000 bid and minimize simple carbs

## 2013-09-22 NOTE — Assessment & Plan Note (Signed)
Tolerating Pravastatin and Gemfibrozil, triglycerides are still up but will address sugar and reassess at next visit.

## 2013-09-22 NOTE — Assessment & Plan Note (Signed)
Is following Cardiology now and is awaiting further work up. No c/o at this time

## 2013-09-24 ENCOUNTER — Ambulatory Visit: Payer: Self-pay | Admitting: Family Medicine

## 2013-10-02 ENCOUNTER — Other Ambulatory Visit: Payer: Self-pay | Admitting: Family Medicine

## 2013-10-10 ENCOUNTER — Ambulatory Visit (HOSPITAL_BASED_OUTPATIENT_CLINIC_OR_DEPARTMENT_OTHER): Payer: Commercial Managed Care - PPO | Admitting: Radiology

## 2013-10-10 ENCOUNTER — Encounter: Payer: Self-pay | Admitting: Cardiology

## 2013-10-10 ENCOUNTER — Ambulatory Visit (HOSPITAL_COMMUNITY): Payer: Commercial Managed Care - PPO | Attending: Cardiology | Admitting: Cardiology

## 2013-10-10 VITALS — BP 136/95 | HR 92 | Ht 74.0 in | Wt 252.0 lb

## 2013-10-10 DIAGNOSIS — I350 Nonrheumatic aortic (valve) stenosis: Secondary | ICD-10-CM

## 2013-10-10 DIAGNOSIS — E119 Type 2 diabetes mellitus without complications: Secondary | ICD-10-CM | POA: Insufficient documentation

## 2013-10-10 DIAGNOSIS — I359 Nonrheumatic aortic valve disorder, unspecified: Secondary | ICD-10-CM | POA: Insufficient documentation

## 2013-10-10 DIAGNOSIS — R079 Chest pain, unspecified: Secondary | ICD-10-CM

## 2013-10-10 DIAGNOSIS — Z8673 Personal history of transient ischemic attack (TIA), and cerebral infarction without residual deficits: Secondary | ICD-10-CM | POA: Insufficient documentation

## 2013-10-10 DIAGNOSIS — I10 Essential (primary) hypertension: Secondary | ICD-10-CM | POA: Insufficient documentation

## 2013-10-10 DIAGNOSIS — R42 Dizziness and giddiness: Secondary | ICD-10-CM | POA: Insufficient documentation

## 2013-10-10 DIAGNOSIS — R9431 Abnormal electrocardiogram [ECG] [EKG]: Secondary | ICD-10-CM

## 2013-10-10 DIAGNOSIS — Z794 Long term (current) use of insulin: Secondary | ICD-10-CM | POA: Insufficient documentation

## 2013-10-10 MED ORDER — TECHNETIUM TC 99M SESTAMIBI GENERIC - CARDIOLITE
11.0000 | Freq: Once | INTRAVENOUS | Status: AC | PRN
  Administered 2013-10-10: 11 via INTRAVENOUS

## 2013-10-10 MED ORDER — TECHNETIUM TC 99M SESTAMIBI GENERIC - CARDIOLITE
33.0000 | Freq: Once | INTRAVENOUS | Status: AC | PRN
  Administered 2013-10-10: 33 via INTRAVENOUS

## 2013-10-10 MED ORDER — REGADENOSON 0.4 MG/5ML IV SOLN
0.4000 mg | Freq: Once | INTRAVENOUS | Status: AC
  Administered 2013-10-10: 0.4 mg via INTRAVENOUS

## 2013-10-10 NOTE — Progress Notes (Signed)
Echo performed. 

## 2013-10-10 NOTE — Progress Notes (Signed)
[facility] SITE 3 NUCLEAR MED [address] [place], [place] [address] [phone]    Cardiology Nuclear Med Study  Gabriel [name] is a 62 y.o. male     MRN : [id number]     DOB: [birth year]  Procedure Date: 10/10/2013  Nuclear Med Background Indication for Stress Test:  Evaluation for Ischemia and Abnormal EKG History:  No known CAD, Cath, Echo 2013 EF 55-65% Cardiac Risk Factors: CVA, Hypertension, IDDM Type 2 and Lipids  Symptoms:  Chest Pain and Dizziness   Nuclear Pre-Procedure Caffeine/Decaff Intake:  None NPO After: 6 pm   Lungs:  clear O2 Sat: 98% on room air. IV 0.9% NS with Angio Cath:  22g  IV Site: R Antecubital  IV Started by:  Bonnita Levan, RN  Chest Size (in):  48 Cup Size: n/a  Height: 6\' 2"  (1.88 m)  Weight:  252 lb (114.306 kg)  BMI:  Body mass index is 32.34 kg/(m^2). Tech Comments:  Held Coreg 12 hours    Nuclear Med Study 1 or 2 day study: 1 day  Stress Test Type:  Stress  Reading MD: Olga Millers, MD  Order Authorizing Provider:  Olga Millers, MD  Resting Radionuclide: Technetium 39m Sestamibi  Resting Radionuclide Dose: 11.0 mCi   Stress Radionuclide:  Technetium 15m Sestamibi  Stress Radionuclide Dose: 33.0 mCi           Stress Protocol Rest HR: 92 Stress HR: 96  Rest BP: 136/95 Stress BP: 141/73  Exercise Time (min): n/a METS: n/a           Dose of Adenosine (mg):  n/a Dose of Lexiscan: 0.4 mg  Dose of Atropine (mg): n/a Dose of Dobutamine: n/a mcg/kg/min (at max HR)  Stress Test Technologist: Nelson Chimes, BS-ES  Nuclear Technologist:  Domenic Polite, CNMT     Rest Procedure:  Myocardial perfusion imaging was performed at rest 45 minutes following the intravenous administration of Technetium 71m Sestamibi. Rest ECG: Sinus rhythm, cannot R/O prior septal MI.  Stress Procedure:  The patient received IV Lexiscan 0.4 mg over 15-seconds with concurrent low level exercise and then Technetium 25m Sestamibi was injected at  30-seconds while the patient continued walking one more minute.  Quantitative spect images were obtained after a 45-minute delay.  Attempted to walk patient on Bruce Protocol.  Patient became fatigued, had knee pain and overall ambulation issues.  He was switched to a Low Level Lexiscan.  With the infusion of Lexiscan, the patient complained of SOB.  This resolved in recovery.  Stress ECG: No significant ST segment change suggestive of ischemia.  QPS Raw Data Images:  Acquisition technically good; normal left ventricular size. Stress Images:  Normal homogeneous uptake in all areas of the myocardium. Rest Images:  Normal homogeneous uptake in all areas of the myocardium. Subtraction (SDS):  No evidence of ischemia. Transient Ischemic Dilatation (Normal <1.22):  1.16 Lung/Heart Ratio (Normal <0.45):  0.32  Quantitative Gated Spect Images QGS EDV:  121 ml QGS ESV:  74 ml  Impression Exercise Capacity:  Lexiscan with low level exercise. BP Response:  Normal blood pressure response. Clinical Symptoms:  There is dyspnea. ECG Impression:  No significant ST segment change suggestive of ischemia. Comparison with Prior Nuclear Study: No previous nuclear study performed  Overall Impression:  Normal stress nuclear study.  LV Ejection Fraction: 39%.  LV Wall Motion:  NL LV Function; NL Wall Motion  Olga Millers

## 2013-10-30 ENCOUNTER — Telehealth: Payer: Self-pay | Admitting: Cardiology

## 2013-10-30 NOTE — Telephone Encounter (Signed)
New message ° ° ° ° °Want stress test and echo results °

## 2013-10-30 NOTE — Telephone Encounter (Signed)
Left message for pt to call.

## 2013-11-04 ENCOUNTER — Other Ambulatory Visit: Payer: Self-pay | Admitting: Family Medicine

## 2013-11-12 ENCOUNTER — Other Ambulatory Visit: Payer: Self-pay | Admitting: Family Medicine

## 2013-11-14 ENCOUNTER — Other Ambulatory Visit: Payer: Self-pay | Admitting: Family Medicine

## 2013-11-14 ENCOUNTER — Ambulatory Visit: Payer: Self-pay | Admitting: Cardiology

## 2013-11-14 NOTE — Telephone Encounter (Signed)
Rx request to pharmacy/[facility]  

## 2013-11-26 ENCOUNTER — Other Ambulatory Visit: Payer: Self-pay

## 2013-11-26 MED ORDER — SITAGLIPTIN PHOS-METFORMIN HCL 50-500 MG PO TABS: 1.0000 | ORAL_TABLET | Freq: Two times a day (BID) | ORAL | Status: DC

## 2013-11-26 MED ORDER — LISINOPRIL 40 MG PO TABS: 40.0000 mg | ORAL_TABLET | Freq: Every day | ORAL | Status: DC

## 2013-11-26 NOTE — Telephone Encounter (Signed)
Pt called in requesting refills on Janumet and Lisinopril to be sent to [facility].  RX's sent

## 2013-11-27 NOTE — Telephone Encounter (Signed)
Pt made aware of results.

## 2013-12-12 ENCOUNTER — Ambulatory Visit: Payer: Commercial Managed Care - PPO | Admitting: Cardiology

## 2013-12-12 ENCOUNTER — Other Ambulatory Visit: Payer: Self-pay | Admitting: Family Medicine

## 2013-12-12 NOTE — Telephone Encounter (Signed)
Refill request for sertraline Last filled by MD on - 10/12/2013 #135 x0 Last Appt: 09/21/2013 Next Appt: 12/20/2013 Please advise refill?

## 2013-12-18 ENCOUNTER — Ambulatory Visit: Payer: Self-pay | Admitting: Family Medicine

## 2013-12-20 ENCOUNTER — Ambulatory Visit: Payer: Commercial Managed Care - PPO | Admitting: Family Medicine

## 2014-01-03 ENCOUNTER — Telehealth: Payer: Self-pay

## 2014-01-03 DIAGNOSIS — I10 Essential (primary) hypertension: Secondary | ICD-10-CM

## 2014-01-03 DIAGNOSIS — E785 Hyperlipidemia, unspecified: Secondary | ICD-10-CM

## 2014-01-03 DIAGNOSIS — E119 Type 2 diabetes mellitus without complications: Secondary | ICD-10-CM

## 2014-01-03 NOTE — Telephone Encounter (Signed)
Lab order replaced 

## 2014-01-04 LAB — RENAL FUNCTION PANEL
Albumin: 4.5 g/dL (ref 3.5–5.2)
BUN: 19 mg/dL (ref 6–23)
CO2: 28 mEq/L (ref 19–32)
CREATININE: 1.22 mg/dL (ref 0.50–1.35)
Calcium: 9.7 mg/dL (ref 8.4–10.5)
Chloride: 98 mEq/L (ref 96–112)
Glucose, Bld: 172 mg/dL — ABNORMAL HIGH (ref 70–99)
Phosphorus: 3.6 mg/dL (ref 2.3–4.6)
Potassium: 4.5 mEq/L (ref 3.5–5.3)
Sodium: 134 mEq/L — ABNORMAL LOW (ref 135–145)

## 2014-01-04 LAB — LIPID PANEL
CHOLESTEROL: 161 mg/dL (ref 0–200)
HDL: 28 mg/dL — ABNORMAL LOW (ref >39)
LDL CALC: 63 mg/dL (ref 0–99)
Total CHOL/HDL Ratio: 5.8 Ratio
Triglycerides: 351 mg/dL — ABNORMAL HIGH (ref <150)
VLDL: 70 mg/dL — AB (ref 0–40)

## 2014-01-04 LAB — HEPATIC FUNCTION PANEL
ALK PHOS: 67 U/L (ref 39–117)
ALT: 13 U/L (ref 0–53)
AST: 14 U/L (ref 0–37)
Albumin: 4.5 g/dL (ref 3.5–5.2)
BILIRUBIN DIRECT: 0.1 mg/dL (ref 0.0–0.3)
Indirect Bilirubin: 0.3 mg/dL (ref 0.2–1.2)
Total Bilirubin: 0.4 mg/dL (ref 0.2–1.2)
Total Protein: 6.8 g/dL (ref 6.0–8.3)

## 2014-01-04 LAB — CBC
HCT: 40.9 % (ref 39.0–52.0)
Hemoglobin: 14.5 g/dL (ref 13.0–17.0)
MCH: 31 pg (ref 26.0–34.0)
MCHC: 35.5 g/dL (ref 30.0–36.0)
MCV: 87.6 fL (ref 78.0–100.0)
Platelets: 187 10*3/uL (ref 150–400)
RBC: 4.67 MIL/uL (ref 4.22–5.81)
RDW: 13.6 % (ref 11.5–15.5)
WBC: 8.2 10*3/uL (ref 4.0–10.5)

## 2014-01-04 LAB — HEMOGLOBIN A1C
HEMOGLOBIN A1C: 8.9 % — AB (ref <5.7)
Mean Plasma Glucose: 209 mg/dL — ABNORMAL HIGH (ref <117)

## 2014-01-05 LAB — TSH: TSH: 0.641 u[IU]/mL (ref 0.350–4.500)

## 2014-01-07 ENCOUNTER — Ambulatory Visit (INDEPENDENT_AMBULATORY_CARE_PROVIDER_SITE_OTHER): Payer: Commercial Managed Care - PPO | Admitting: Family Medicine

## 2014-01-07 ENCOUNTER — Encounter: Payer: Self-pay | Admitting: Family Medicine

## 2014-01-07 VITALS — BP 130/92 | HR 92 | Temp 98.1°F | Ht 73.0 in | Wt 256.1 lb

## 2014-01-07 DIAGNOSIS — M5410 Radiculopathy, site unspecified: Secondary | ICD-10-CM

## 2014-01-07 DIAGNOSIS — IMO0002 Reserved for concepts with insufficient information to code with codable children: Secondary | ICD-10-CM

## 2014-01-07 DIAGNOSIS — E669 Obesity, unspecified: Secondary | ICD-10-CM

## 2014-01-07 DIAGNOSIS — E119 Type 2 diabetes mellitus without complications: Secondary | ICD-10-CM

## 2014-01-07 DIAGNOSIS — I10 Essential (primary) hypertension: Secondary | ICD-10-CM

## 2014-01-07 DIAGNOSIS — E785 Hyperlipidemia, unspecified: Secondary | ICD-10-CM

## 2014-01-07 MED ORDER — SITAGLIPTIN PHOS-METFORMIN HCL 50-500 MG PO TABS: 1.0000 | ORAL_TABLET | Freq: Every day | ORAL | Status: DC

## 2014-01-07 MED ORDER — FUROSEMIDE 40 MG PO TABS: 20.0000 mg | ORAL_TABLET | Freq: Every day | ORAL | Status: DC

## 2014-01-07 MED ORDER — METHOCARBAMOL 500 MG PO TABS: 500.0000 mg | ORAL_TABLET | Freq: Two times a day (BID) | ORAL | Status: DC | PRN

## 2014-01-07 MED ORDER — METFORMIN HCL 500 MG PO TABS: 500.0000 mg | ORAL_TABLET | Freq: Every day | ORAL | Status: DC

## 2014-01-07 NOTE — Assessment & Plan Note (Signed)
Well controlled, no changes to meds. Encouraged heart healthy diet such as the DASH diet and exercise as tolerated.  °

## 2014-01-07 NOTE — Progress Notes (Signed)
Pre visit review using our clinic review tool, if applicable. No additional management support is needed unless otherwise documented below in the visit note. 

## 2014-01-07 NOTE — Assessment & Plan Note (Addendum)
Poorly controlled, continue insulin. Was not taking his Janumet for roughly a month. Restart 50/500 qd and Metformin qd. Minimize simple carbs

## 2014-01-07 NOTE — Patient Instructions (Signed)

## 2014-01-07 NOTE — Progress Notes (Signed)
Patient ID: Gabriel [name], male   DOB: 1951-07-18, 63 y.o.   MRN: [id number] Lekendrick Alpern [id number] Nov 19, 1950 01/07/2014      Progress Note-Follow Up  Subjective  Chief Complaint  Chief Complaint  Patient presents with  . Follow-up    3 month    HPI  Patient is a 63 year old male in today for routine medical care. Patient is in today for followup. He is complaining of some recent trouble with medications. When he took Janumet twice daily he struggled with increased fatigue nausea malaise and myalgias. When he dropped it to daily he did improve somewhat. He has stopped at this time. He notes his blood sugars have gone up. He's been seeing generally 130s to 160s in the head see numbers down to the 120s and when he did not eat he some numbers in the 70s and 80s at which time he felt tremulous. No fevers or chills. No recent illness. Continues to struggle with some intermittent right back pain in the lower region. No radicular symptoms or incontinence. No GI or GU complaints. Has appointment with cardiology soon. Denies chest pain, palpitations or shortness of breath  Past Medical History  Diagnosis Date  . Arthritis   . Depression   . History of chronic bronchitis     dx 2011  . Seasonal allergies   . Aortic stenosis   . Hypertension   . Diabetes mellitus, type 2   . Hyperlipidemia   . History of stroke 1992    blood clot at base of brain-[facility]  . Congestive heart failure     September 2011-CHF  . History of chicken pox   . Obesity, unspecified 11/27/2012  . Tobacco abuse 11/27/2012    Down from 3 ppd to 1/2 ppd  . Allergic state 02/08/2013  . Acute low back pain with radicular symptoms, duration less than 6 weeks 05/16/2013    Past Surgical History  Procedure Laterality Date  . Knee surgery  1976, 1989    right knee torn ligament repair  . Lumbar fusion  1982    L4-L5  . Revision total knee arthroplasty  2007    right knee replacement    Family History   Problem Relation Age of Onset  . Arthritis      mother/father/paternal grandparents  . Heart disease Father     pacemaker  . Hypertension Father   . Hypertension Paternal Grandfather   . Hypertension Brother   . Emotional abuse Mother   . Diabetes Brother      x 2  . Diabetes Paternal Grandmother     History   Social History  . Marital Status: Widowed    Spouse Name: N/A    Number of Children: 2  . 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 Smoking status: Current Every Day Smoker -- 1.00 packs/day  . Smokeless tobacco: Never Used     Comment: smokes a pack per day-started in 1972  . Alcohol Use: No  . Drug Use: No  . Sexual Activity: Not on file   Other Topics Concern  . Not on file   Social History Narrative   Regular exercise-no   Caffeine Use-yes          Current Outpatient Prescriptions on File Prior to Visit  Medication Sig Dispense Refill  . albuterol (PROVENTIL HFA;VENTOLIN HFA) 108 (90 BASE) MCG/ACT inhaler Inhale 2 puffs into  the lungs every 6 (six) hours as needed for wheezing.  1 Inhaler  0  . amitriptyline (ELAVIL) 10 MG tablet TAKE 1 TABLET AT BEDTIME  90 tablet  1  . amLODipine (NORVASC) 10 MG tablet TAKE 1 TABLET DAILY  90 tablet  1  . aspirin 81 MG tablet Take 81 mg by mouth daily.        . carvedilol (COREG) 25 MG tablet TAKE ONE AND ONE-HALF TABLETS TWICE A DAY  270 tablet  1  . cyclobenzaprine (FLEXERIL) 10 MG tablet Take 1 tablet (10 mg total) by mouth at bedtime as needed for muscle spasms.  30 tablet  2  . fluticasone (FLONASE) 50 MCG/ACT nasal spray Place 2 sprays into the nose daily as needed for rhinitis or allergies.  16 g  6  . furosemide (LASIX) 40 MG tablet Take 0.5 tablets (20 mg total) by mouth daily.  90 tablet  1  . gemfibrozil (LOPID) 600 MG tablet Take 1 tablet (600 mg total) by mouth 2 (two) times daily before a meal.  180 tablet  0  .  HYDROcodone-acetaminophen (NORCO/VICODIN) 5-325 MG per tablet Take 1-2 tablets by mouth every 6 (six) hours as needed for pain.      Marland Kitchen insulin glargine (LANTUS) 100 UNIT/ML injection Inject 1.4 mLs (140 Units total) into the skin daily.  13 mL  prn  . insulin lispro (HUMALOG) 100 UNIT/ML injection Inject 10 Units into the skin daily. With biggest meal  15 mL  1  . Insulin Pen Needle 31G X 5 MM MISC USE AS DIRECTED ONE TIME DAILY.  100 each  prn  . Krill Oil CAPS MegaRed or a generic by schiff daily      . lisinopril (PRINIVIL,ZESTRIL) 40 MG tablet Take 1 tablet (40 mg total) by mouth daily.  45 tablet  1  . methocarbamol (ROBAXIN) 500 MG tablet Take 1 tablet (500 mg total) by mouth 2 (two) times daily as needed (pain, muscle spasm). During the day  60 tablet  1  . Multiple Vitamin (MULTIVITAMIN) tablet Take 1 tablet by mouth daily.        . pravastatin (PRAVACHOL) 20 MG tablet Take 1 tablet (20 mg total) by mouth daily.  90 tablet  0  . sertraline (ZOLOFT) 100 MG tablet TAKE ONE AND ONE-HALF TABLETS DAILY  135 tablet  0  . sitaGLIPtin-metformin (JANUMET) 50-500 MG per tablet Take 1 tablet by mouth 2 (two) times daily with a meal.  180 tablet  1  . triamterene-hydrochlorothiazide (MAXZIDE-25) 37.5-25 MG per tablet Take 1 tablet by mouth daily.  90 tablet  3  . vardenafil (LEVITRA) 10 MG tablet Take 10 mg by mouth daily as needed.       No current facility-administered medications on file prior to visit.    No Known Allergies  Review of Systems  Review of Systems  Constitutional: Negative for fever and malaise/fatigue.  HENT: Negative for congestion.   Eyes: Negative for discharge.  Respiratory: Negative for shortness of breath.   Cardiovascular: Negative for chest pain, palpitations and leg swelling.  Gastrointestinal: Positive for nausea. Negative for abdominal pain and diarrhea.  Genitourinary: Negative for dysuria.  Musculoskeletal: Negative for falls.  Skin: Negative for rash.   Neurological: Negative for loss of consciousness and headaches.  Endo/Heme/Allergies: Negative for polydipsia.  Psychiatric/Behavioral: Negative for depression and suicidal ideas. The patient is not nervous/anxious and does not have insomnia.     Objective  BP 130/92  Pulse 92  Temp(Src) 98.1 F (36.7 C) (Oral)  Ht 6\' 1"  (1.854 m)  Wt 256 lb 1.9 oz (116.175 kg)  BMI 33.80 kg/m2  SpO2 96%  Physical Exam  Physical Exam  Constitutional: He is oriented to person, place, and time and well-developed, well-nourished, and in no distress. No distress.  HENT:  Head: Normocephalic and atraumatic.  Eyes: Conjunctivae are normal.  Neck: Neck supple. No thyromegaly present.  Cardiovascular: Normal rate, regular rhythm and normal heart sounds.   No murmur heard. Pulmonary/Chest: Effort normal and breath sounds normal. No respiratory distress.  Abdominal: He exhibits no distension and no mass. There is no tenderness.  Musculoskeletal: He exhibits no edema.  Neurological: He is alert and oriented to person, place, and time.  Skin: Skin is warm.  Psychiatric: Memory, affect and judgment normal.    Lab Results  Component Value Date   TSH 0.641 01/03/2014   Lab Results  Component Value Date   WBC 8.2 01/03/2014   HGB 14.5 01/03/2014   HCT 40.9 01/03/2014   MCV 87.6 01/03/2014   PLT 187 01/03/2014   Lab Results  Component Value Date   CREATININE 1.22 01/03/2014   BUN 19 01/03/2014   NA 134* 01/03/2014   K 4.5 01/03/2014   CL 98 01/03/2014   CO2 28 01/03/2014   Lab Results  Component Value Date   ALT 13 01/03/2014   AST 14 01/03/2014   ALKPHOS 67 01/03/2014   BILITOT 0.4 01/03/2014   Lab Results  Component Value Date   CHOL 161 01/03/2014   Lab Results  Component Value Date   HDL 28* 01/03/2014   Lab Results  Component Value Date   LDLCALC 63 01/03/2014   Lab Results  Component Value Date   TRIG 351* 01/03/2014   Lab Results  Component Value Date   CHOLHDL 5.8 01/03/2014      Assessment & Plan  HTN (hypertension) Well controlled, no changes to meds. Encouraged heart healthy diet such as the DASH diet and exercise as tolerated.   DM (diabetes mellitus) Poorly controlled, continue insulin. Was not taking his Janumet for roughly a month. Restart 50/500 qd and Metformin qd. Minimize simple carbs  Other and unspecified hyperlipidemia Tolerating statin, encouraged heart healthy diet, avoid trans fats, minimize simple carbs and saturated fats. Increase exercise as tolerated  Obesity, unspecified Well controlled, no changes to meds. Encouraged heart healthy diet such as the DASH diet and exercise as tolerated.   Acute low back pain with radicular symptoms, duration less than 6 weeks Did find Methocarbamol helpful. May use prn, improving.

## 2014-01-08 ENCOUNTER — Telehealth: Payer: Self-pay | Admitting: Family Medicine

## 2014-01-08 NOTE — Telephone Encounter (Signed)
Relevant patient education assigned to patient using Emmi. ° °

## 2014-01-12 ENCOUNTER — Encounter: Payer: Self-pay | Admitting: Family Medicine

## 2014-01-12 NOTE — Assessment & Plan Note (Signed)
Tolerating statin, encouraged heart healthy diet, avoid trans fats, minimize simple carbs and saturated fats. Increase exercise as tolerated 

## 2014-01-12 NOTE — Assessment & Plan Note (Signed)
Well controlled, no changes to meds. Encouraged heart healthy diet such as the DASH diet and exercise as tolerated.  °

## 2014-01-12 NOTE — Assessment & Plan Note (Signed)
Did find Methocarbamol helpful. May use prn, improving.

## 2014-01-15 ENCOUNTER — Telehealth: Payer: Self-pay

## 2014-01-15 NOTE — Telephone Encounter (Signed)
Relevant patient education assigned to patient using Emmi. ° °

## 2014-02-06 ENCOUNTER — Encounter: Payer: Self-pay | Admitting: Cardiology

## 2014-02-06 ENCOUNTER — Ambulatory Visit (INDEPENDENT_AMBULATORY_CARE_PROVIDER_SITE_OTHER): Payer: Commercial Managed Care - PPO | Admitting: Cardiology

## 2014-02-06 VITALS — BP 130/86 | HR 92 | Ht 73.0 in | Wt 256.0 lb

## 2014-02-06 DIAGNOSIS — F17200 Nicotine dependence, unspecified, uncomplicated: Secondary | ICD-10-CM

## 2014-02-06 DIAGNOSIS — I10 Essential (primary) hypertension: Secondary | ICD-10-CM

## 2014-02-06 DIAGNOSIS — E785 Hyperlipidemia, unspecified: Secondary | ICD-10-CM

## 2014-02-06 DIAGNOSIS — I359 Nonrheumatic aortic valve disorder, unspecified: Secondary | ICD-10-CM

## 2014-02-06 DIAGNOSIS — Z720 Tobacco use: Secondary | ICD-10-CM

## 2014-02-06 DIAGNOSIS — I350 Nonrheumatic aortic (valve) stenosis: Secondary | ICD-10-CM

## 2014-02-06 NOTE — Assessment & Plan Note (Signed)
Continue present blood pressure medications. 

## 2014-02-06 NOTE — Patient Instructions (Signed)
Your physician wants you to follow-up in: ONE YEAR WITH DR Shelda PalRENSHAW You will receive a reminder letter in the mail two months in advance. If you don't receive a letter, please call our office to schedule the follow-up appointment.   Your physician has requested that you have an echocardiogram. Echocardiography is a painless test that uses sound waves to create images of your heart. It provides your doctor with information about the size and shape of your heart and how well your heart's chambers and valves are working. This procedure takes approximately one hour. There are no restrictions for this procedure.  SCHEDULE IN December 2015

## 2014-02-06 NOTE — Progress Notes (Signed)
HPI: FU AS. [facility] showed an ejection fraction of 50% and a 30% right coronary artery. Echocardiogram in Dec 2014 showed normal LV function, grade 1 diastolic dysfunction, mild left atrial enlargement and mild AS by doppler (mean gradient 16 mmHg; moderate AS visually). Nuclear study 12/14 showed EF 39 and normal perfusion. Last seen December 2014. Since then, the patient denies any dyspnea on exertion, orthopnea, PND, pedal edema, palpitations, syncope or chest pain.    Current Outpatient Prescriptions  Medication Sig Dispense Refill  . albuterol (PROVENTIL HFA;VENTOLIN HFA) 108 (90 BASE) MCG/ACT inhaler Inhale 2 puffs into the lungs every 6 (six) hours as needed for wheezing.  1 Inhaler  0  . amLODipine (NORVASC) 10 MG tablet TAKE 1 TABLET DAILY  90 tablet  1  . aspirin 81 MG tablet Take 81 mg by mouth daily.        . carvedilol (COREG) 25 MG tablet TAKE ONE AND ONE-HALF TABLETS TWICE A DAY  270 tablet  1  . fluticasone (FLONASE) 50 MCG/ACT nasal spray Place 2 sprays into the nose daily as needed for rhinitis or allergies.  16 g  6  . furosemide (LASIX) 40 MG tablet Take 0.5 tablets (20 mg total) by mouth daily.  45 tablet  3  . gemfibrozil (LOPID) 600 MG tablet Take 1 tablet (600 mg total) by mouth 2 (two) times daily before a meal.  180 tablet  0  . insulin glargine (LANTUS) 100 UNIT/ML injection Inject 1.4 mLs (140 Units total) into the skin daily.  13 mL  prn  . insulin lispro (HUMALOG) 100 UNIT/ML injection Inject 10 Units into the skin daily. With biggest meal  15 mL  1  . Insulin Pen Needle 31G X 5 MM MISC USE AS DIRECTED ONE TIME DAILY.  100 each  prn  . Krill Oil CAPS MegaRed or a generic by schiff daily      . lisinopril (PRINIVIL,ZESTRIL) 40 MG tablet Take 1 tablet (40 mg total) by mouth daily.  45 tablet  1  . metFORMIN (GLUCOPHAGE) 500 MG tablet Take 1 tablet (500 mg total) by mouth daily.  180 tablet  0  . methocarbamol (ROBAXIN) 500  MG tablet Take 1 tablet (500 mg total) by mouth 2 (two) times daily as needed (pain, muscle spasm). During the day  60 tablet  1  . Multiple Vitamin (MULTIVITAMIN) tablet Take 1 tablet by mouth daily.        . pravastatin (PRAVACHOL) 20 MG tablet Take 1 tablet (20 mg total) by mouth daily.  90 tablet  0  . sertraline (ZOLOFT) 100 MG tablet TAKE ONE AND ONE-HALF TABLETS DAILY  135 tablet  0  . sitaGLIPtin-metformin (JANUMET) 50-500 MG per tablet Take 1 tablet by mouth daily.  180 tablet  1  . triamterene-hydrochlorothiazide (MAXZIDE-25) 37.5-25 MG per tablet Take 1 tablet by mouth daily.  90 tablet  3  . vardenafil (LEVITRA) 10 MG tablet Take 10 mg by mouth daily as needed.       No current facility-administered medications for this visit.     Past Medical History  Diagnosis Date  . Arthritis   . Depression   . History of chronic bronchitis     dx 2011  . Seasonal allergies   . Aortic stenosis   . Hypertension   . Diabetes mellitus, type 2   . Hyperlipidemia   . History of stroke 1992    blood  clot at base of brain-[facility]  . Congestive heart failure     September 2011-CHF  . History of chicken pox   . Obesity, unspecified 11/27/2012  . Tobacco abuse 11/27/2012    Down from 3 ppd to 1/2 ppd  . Allergic state 02/08/2013  . Acute low back pain with radicular symptoms, duration less than 6 weeks 05/16/2013    Past Surgical History  Procedure Laterality Date  . Knee surgery  1976, 1989    right knee torn ligament repair  . Lumbar fusion  1982    L4-L5  . Revision total knee arthroplasty  2007    right knee replacement    History   Social History  . Marital Status: Widowed    Spouse Name: N/A    Number of Children: 2  . 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 Smoking status: Current Every Day Smoker -- 1.00 packs/day  . Smokeless tobacco: Never Used     Comment: smokes a pack per  day-started in 1972  . Alcohol Use: No  . Drug Use: No  . Sexual Activity: Not on file   Other Topics Concern  . Not on file   Social History Narrative   Regular exercise-no   Caffeine Use-yes          ROS: no fevers or chills, productive cough, hemoptysis, dysphasia, odynophagia, melena, hematochezia, dysuria, hematuria, rash, seizure activity, orthopnea, PND, pedal edema, claudication. Remaining systems are negative.  Physical Exam: Well-developed well-nourished in no acute distress.  Skin is warm and dry.  HEENT is normal.  Neck is supple.  Chest is clear to auscultation with normal expansion.  Cardiovascular exam is regular rate and rhythm. 3/6 systolic murmur left sternal border radiating to the carotids. S2 is preserved. Abdominal exam nontender or distended. No masses palpated. Extremities show no edema. neuro grossly intact  ECG Sinus rhythm at a rate of 92. Inferior lateral T-wave inversion.

## 2014-02-06 NOTE — Assessment & Plan Note (Signed)
Repeat echocardiogram in December 2015. Patient instructed on symptoms of aortic stenosis including chest pain, dyspnea and syncope.

## 2014-02-06 NOTE — Assessment & Plan Note (Signed)
Patient counseled on discontinuing. 

## 2014-02-06 NOTE — Assessment & Plan Note (Signed)
Continue statin. 

## 2014-02-21 ENCOUNTER — Other Ambulatory Visit: Payer: Self-pay | Admitting: Family Medicine

## 2014-03-10 ENCOUNTER — Other Ambulatory Visit: Payer: Self-pay | Admitting: Endocrinology

## 2014-03-27 ENCOUNTER — Other Ambulatory Visit: Payer: Self-pay | Admitting: Family Medicine

## 2014-04-03 ENCOUNTER — Other Ambulatory Visit: Payer: Self-pay | Admitting: Family Medicine

## 2014-04-08 ENCOUNTER — Telehealth: Payer: Self-pay | Admitting: Family Medicine

## 2014-04-08 MED ORDER — LISINOPRIL 40 MG PO TABS: 40.0000 mg | ORAL_TABLET | Freq: Every day | ORAL | Status: DC

## 2014-04-08 NOTE — Telephone Encounter (Signed)
Patient left a message asking for his scripts to be corrected with  [facility]. He usually gets a 90 day supply but they only sent him 45 Lisinopril tablets and only 3 boxes on Lancets when he usually gets 6 boxes.

## 2014-04-08 NOTE — Telephone Encounter (Signed)
Lisinopril resent to [facility] #[facility]; no lancets listed on pt's EMR med list [past or present]/SLS

## 2014-04-11 ENCOUNTER — Telehealth: Payer: Self-pay | Admitting: Family Medicine

## 2014-04-11 DIAGNOSIS — E785 Hyperlipidemia, unspecified: Secondary | ICD-10-CM

## 2014-04-11 DIAGNOSIS — E1059 Type 1 diabetes mellitus with other circulatory complications: Secondary | ICD-10-CM

## 2014-04-11 DIAGNOSIS — Z79899 Other long term (current) drug therapy: Secondary | ICD-10-CM

## 2014-04-11 DIAGNOSIS — E119 Type 2 diabetes mellitus without complications: Secondary | ICD-10-CM

## 2014-04-11 DIAGNOSIS — I10 Essential (primary) hypertension: Secondary | ICD-10-CM

## 2014-04-11 NOTE — Telephone Encounter (Signed)
Pt is here for labs now, please enter orders. DM, HTN, hi chol, labs prior, lipid, renal, cbc, tsh, hepatic, hgba1c prior.

## 2014-04-11 NOTE — Telephone Encounter (Signed)
Patient presented to lab w/o Orders; turned away per [facility] policy, pt will return on Monday/SLS

## 2014-04-12 NOTE — Telephone Encounter (Signed)
Lab Orders placed/SLS

## 2014-04-15 ENCOUNTER — Ambulatory Visit: Payer: Self-pay | Admitting: Family Medicine

## 2014-05-02 ENCOUNTER — Telehealth: Payer: Self-pay

## 2014-05-02 NOTE — Telephone Encounter (Signed)
Diabetic Bundle  I left a detailed message asking pt to come in at his earliest convenience to get his labwork that was placed at the end of June drawn

## 2014-05-15 LAB — BASIC METABOLIC PANEL
BUN: 23 mg/dL (ref 6–23)
CHLORIDE: 96 meq/L (ref 96–112)
CO2: 25 meq/L (ref 19–32)
Calcium: 10.1 mg/dL (ref 8.4–10.5)
Creat: 1.32 mg/dL (ref 0.50–1.35)
GLUCOSE: 184 mg/dL — AB (ref 70–99)
Potassium: 4.3 mEq/L (ref 3.5–5.3)
Sodium: 133 mEq/L — ABNORMAL LOW (ref 135–145)

## 2014-05-15 LAB — CBC WITH DIFFERENTIAL/PLATELET
Basophils Absolute: 0.1 10*3/uL (ref 0.0–0.1)
Basophils Relative: 1 % (ref 0–1)
EOS ABS: 0.3 10*3/uL (ref 0.0–0.7)
Eosinophils Relative: 3 % (ref 0–5)
HEMATOCRIT: 41.6 % (ref 39.0–52.0)
HEMOGLOBIN: 14.5 g/dL (ref 13.0–17.0)
Lymphocytes Relative: 16 % (ref 12–46)
Lymphs Abs: 1.6 10*3/uL (ref 0.7–4.0)
MCH: 31.6 pg (ref 26.0–34.0)
MCHC: 34.9 g/dL (ref 30.0–36.0)
MCV: 90.6 fL (ref 78.0–100.0)
MONO ABS: 0.7 10*3/uL (ref 0.1–1.0)
MONOS PCT: 7 % (ref 3–12)
NEUTROS ABS: 7.4 10*3/uL (ref 1.7–7.7)
Neutrophils Relative %: 73 % (ref 43–77)
Platelets: 197 10*3/uL (ref 150–400)
RBC: 4.59 MIL/uL (ref 4.22–5.81)
RDW: 13.9 % (ref 11.5–15.5)
WBC: 10.2 10*3/uL (ref 4.0–10.5)

## 2014-05-15 LAB — TSH: TSH: 1.011 u[IU]/mL (ref 0.350–4.500)

## 2014-05-15 LAB — LIPID PANEL
Cholesterol: 174 mg/dL (ref 0–200)
HDL: 26 mg/dL — AB (ref >39)
TRIGLYCERIDES: 587 mg/dL — AB (ref <150)
Total CHOL/HDL Ratio: 6.7 Ratio

## 2014-05-15 LAB — HEPATIC FUNCTION PANEL
ALBUMIN: 4.6 g/dL (ref 3.5–5.2)
ALT: 14 U/L (ref 0–53)
AST: 14 U/L (ref 0–37)
Alkaline Phosphatase: 68 U/L (ref 39–117)
Bilirubin, Direct: <0.1 mg/dL (ref 0.0–0.3)
TOTAL PROTEIN: 6.9 g/dL (ref 6.0–8.3)
Total Bilirubin: 0.3 mg/dL (ref 0.2–1.2)

## 2014-05-15 LAB — HEMOGLOBIN A1C
HEMOGLOBIN A1C: 9.3 % — AB (ref <5.7)
MEAN PLASMA GLUCOSE: 220 mg/dL — AB (ref <117)

## 2014-05-23 ENCOUNTER — Ambulatory Visit (INDEPENDENT_AMBULATORY_CARE_PROVIDER_SITE_OTHER): Payer: Commercial Managed Care - PPO | Admitting: Family Medicine

## 2014-05-23 ENCOUNTER — Encounter: Payer: Self-pay | Admitting: Family Medicine

## 2014-05-23 VITALS — BP 118/74 | HR 69 | Temp 97.7°F | Ht 74.0 in | Wt 252.0 lb

## 2014-05-23 DIAGNOSIS — E118 Type 2 diabetes mellitus with unspecified complications: Secondary | ICD-10-CM

## 2014-05-23 DIAGNOSIS — I10 Essential (primary) hypertension: Secondary | ICD-10-CM

## 2014-05-23 DIAGNOSIS — G609 Hereditary and idiopathic neuropathy, unspecified: Secondary | ICD-10-CM

## 2014-05-23 DIAGNOSIS — E785 Hyperlipidemia, unspecified: Secondary | ICD-10-CM

## 2014-05-23 DIAGNOSIS — J069 Acute upper respiratory infection, unspecified: Secondary | ICD-10-CM

## 2014-05-23 DIAGNOSIS — E669 Obesity, unspecified: Secondary | ICD-10-CM

## 2014-05-23 MED ORDER — METFORMIN HCL 500 MG PO TABS: 500.0000 mg | ORAL_TABLET | Freq: Two times a day (BID) | ORAL | Status: DC

## 2014-05-23 MED ORDER — PRAVASTATIN SODIUM 20 MG PO TABS: ORAL_TABLET | ORAL | Status: DC

## 2014-05-23 MED ORDER — INSULIN LISPRO 100 UNIT/ML ~~LOC~~ SOLN: 10.0000 [IU] | Freq: Two times a day (BID) | SUBCUTANEOUS | Status: DC

## 2014-05-23 NOTE — Patient Instructions (Signed)

## 2014-05-23 NOTE — Progress Notes (Signed)
Pre visit review using our clinic review tool, if applicable. No additional management support is needed unless otherwise documented below in the visit note. 

## 2014-05-26 ENCOUNTER — Encounter: Payer: Self-pay | Admitting: Family Medicine

## 2014-05-26 DIAGNOSIS — G609 Hereditary and idiopathic neuropathy, unspecified: Secondary | ICD-10-CM

## 2014-05-26 DIAGNOSIS — J069 Acute upper respiratory infection, unspecified: Secondary | ICD-10-CM | POA: Insufficient documentation

## 2014-05-26 HISTORY — DX: Hereditary and idiopathic neuropathy, unspecified: G60.9

## 2014-05-26 NOTE — Assessment & Plan Note (Signed)
Encouraged DASH diet, decrease po intake and increase exercise as tolerated. Needs 7-8 hours of sleep nightly. Avoid trans fats, eat small, frequent meals every 4-5 hours with lean proteins, complex carbs and healthy fats. Minimize simple carbs, GMO foods. 

## 2014-05-26 NOTE — Assessment & Plan Note (Addendum)
hgba1c too high, minimize simple carbs. Increase exercise as tolerated. Continue current meds. Has had some low sugars while leaving work. He acknowledges he had not eaten for awhile. Needs ot eat small, frequent meals with protein to keep sugars from dropping. Take Metformin bid and increase Humalog to bid.

## 2014-05-26 NOTE — Assessment & Plan Note (Signed)
Increase Pravastain by 2 tabs weekly. Avoid trans fats, increase exercise.

## 2014-05-26 NOTE — Assessment & Plan Note (Signed)
Encouraged increased rest and hydration, add probiotics, zinc such as Coldeze or Xicam. Treat fevers as needed 

## 2014-05-26 NOTE — Progress Notes (Signed)
Patient ID: Gabriel [name], male   DOB: 02-Aug-1951, 63 y.o.   MRN: [id number] Nikoli Nasser [id number] 08-Feb-1951 05/26/2014      Progress Note-Follow Up  Subjective  Chief Complaint  Chief Complaint  Patient presents with  . Follow-up    month    HPI  Patient is a 63 year old male in today for routine medical care. He is in for routine follow up. Has been struggling with cold symptoms for the past 2 weeks but he does think they are improving. Has had a cough that is intermittently productive of white phlegm. He feels Mucinex has been helping. No fevers/chills/ear pain or sore throat. Has noted some intermittent wheezing as well. No cp/palp/gi or gu c/o. Janumet caused some GI upset so he stopped it. Is taking Megared.  Past Medical History  Diagnosis Date  . Arthritis   . Depression   . History of chronic bronchitis     dx 2011  . Seasonal allergies   . Aortic stenosis   . Hypertension   . Diabetes mellitus, type 2   . Hyperlipidemia   . History of stroke 1992    blood clot at base of brain-[facility]  . Congestive heart failure     September 2011-CHF  . History of chicken pox   . Obesity, unspecified 11/27/2012  . Tobacco abuse 11/27/2012    Down from 3 ppd to 1/2 ppd  . Allergic state 02/08/2013  . Acute low back pain with radicular symptoms, duration less than 6 weeks 05/16/2013  . Viral URI 05/26/2014    Past Surgical History  Procedure Laterality Date  . Knee surgery  1976, 1989    right knee torn ligament repair  . Lumbar fusion  1982    L4-L5  . Revision total knee arthroplasty  2007    right knee replacement    Family History  Problem Relation Age of Onset  . Arthritis      mother/father/paternal grandparents  . Heart disease Father     pacemaker  . Hypertension Father   . Hypertension Paternal Grandfather   . Hypertension Brother   . Emotional abuse Mother   . Diabetes Brother      x 2  . Diabetes Paternal Grandmother     History   Social  History  . Marital Status: Widowed    Spouse Name: N/A    Number of Children: 2  . 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 Smoking status: Current Every Day Smoker -- 1.00 packs/day  . Smokeless tobacco: Never Used     Comment: smokes a pack per day-started in 1972  . Alcohol Use: No  . Drug Use: No  . Sexual Activity: Not on file   Other Topics Concern  . Not on file   Social History Narrative   Regular exercise-no   Caffeine Use-yes          Current Outpatient Prescriptions on File Prior to Visit  Medication Sig Dispense Refill  . albuterol (PROVENTIL HFA;VENTOLIN HFA) 108 (90 BASE) MCG/ACT inhaler Inhale 2 puffs into the lungs every 6 (six) hours as needed for wheezing.  1 Inhaler  0  . amLODipine (NORVASC) 10 MG tablet TAKE 1 TABLET DAILY  90 tablet  0  . aspirin 81 MG tablet Take 81 mg by mouth daily.        . carvedilol (COREG) 25 MG tablet TAKE  ONE AND ONE-HALF TABLETS TWICE A DAY  270 tablet  0  . fluticasone (FLONASE) 50 MCG/ACT nasal spray Place 2 sprays into the nose daily as needed for rhinitis or allergies.  16 g  6  . furosemide (LASIX) 40 MG tablet Take 0.5 tablets (20 mg total) by mouth daily.  45 tablet  3  . gemfibrozil (LOPID) 600 MG tablet TAKE 1 TABLET TWICE A DAY BEFORE A MEAL  180 tablet  1  . Insulin Pen Needle 31G X 5 MM MISC USE AS DIRECTED ONE TIME DAILY.  100 each  prn  . Krill Oil CAPS MegaRed or a generic by schiff daily      . LANTUS SOLOSTAR 100 UNIT/ML Solostar Pen INJECT 140 UNITS (1.4 MLS) UNDER THE SKIN DAILY  45 pen  0  . lisinopril (PRINIVIL,ZESTRIL) 40 MG tablet Take 1 tablet (40 mg total) by mouth daily.  90 tablet  0  . methocarbamol (ROBAXIN) 500 MG tablet Take 1 tablet (500 mg total) by mouth 2 (two) times daily as needed (pain, muscle spasm). During the day  60 tablet  1  . Multiple Vitamin (MULTIVITAMIN) tablet Take 1 tablet by mouth daily.        . sertraline  (ZOLOFT) 100 MG tablet TAKE ONE AND ONE-HALF TABLETS DAILY  135 tablet  1  . triamterene-hydrochlorothiazide (MAXZIDE-25) 37.5-25 MG per tablet Take 1 tablet by mouth daily.  90 tablet  3  . vardenafil (LEVITRA) 10 MG tablet Take 10 mg by mouth daily as needed.       No current facility-administered medications on file prior to visit.    No Known Allergies  Review of Systems  Review of Systems  Constitutional: Negative for fever and malaise/fatigue.  HENT: Negative for congestion.   Eyes: Negative for discharge.  Respiratory: Negative for shortness of breath.   Cardiovascular: Negative for chest pain, palpitations and leg swelling.  Gastrointestinal: Negative for nausea, abdominal pain and diarrhea.  Genitourinary: Negative for dysuria.  Musculoskeletal: Negative for falls.  Skin: Negative for rash.  Neurological: Negative for loss of consciousness and headaches.  Endo/Heme/Allergies: Negative for polydipsia.  Psychiatric/Behavioral: Negative for depression and suicidal ideas. The patient is not nervous/anxious and does not have insomnia.     Objective  BP 118/74  Pulse 69  Temp(Src) 97.7 F (36.5 C) (Oral)  Ht 6\' 2"  (1.88 m)  Wt 252 lb 0.6 oz (114.325 kg)  BMI 32.35 kg/m2  SpO2 93%  Physical Exam  Physical Exam  Constitutional: He is oriented to person, place, and time and well-developed, well-nourished, and in no distress. No distress.  HENT:  Head: Normocephalic and atraumatic.  Eyes: Conjunctivae are normal.  Neck: Neck supple. No thyromegaly present.  Cardiovascular: Normal rate, regular rhythm and normal heart sounds.   No murmur heard. Pulmonary/Chest: Effort normal and breath sounds normal. No respiratory distress.  Abdominal: He exhibits no distension and no mass. There is no tenderness.  Musculoskeletal: He exhibits no edema.  Neurological: He is alert and oriented to person, place, and time.  Skin: Skin is warm.  Psychiatric: Memory, affect and judgment  normal.    Lab Results  Component Value Date   TSH 1.011 05/15/2014   Lab Results  Component Value Date   WBC 10.2 05/15/2014   HGB 14.5 05/15/2014   HCT 41.6 05/15/2014   MCV 90.6 05/15/2014   PLT 197 05/15/2014   Lab Results  Component Value Date   CREATININE 1.32 05/15/2014   BUN 23  05/15/2014   NA 133* 05/15/2014   K 4.3 05/15/2014   CL 96 05/15/2014   CO2 25 05/15/2014   Lab Results  Component Value Date   ALT 14 05/15/2014   AST 14 05/15/2014   ALKPHOS 68 05/15/2014   BILITOT 0.3 05/15/2014   Lab Results  Component Value Date   CHOL 174 05/15/2014   Lab Results  Component Value Date   HDL 26* 05/15/2014   Lab Results  Component Value Date   LDLCALC NOT CALC 05/15/2014   Lab Results  Component Value Date   TRIG 587* 05/15/2014   Lab Results  Component Value Date   CHOLHDL 6.7 05/15/2014     Assessment & Plan  HTN (hypertension) Well controlled, no changes to meds. Encouraged heart healthy diet such as the DASH diet and exercise as tolerated.   DM (diabetes mellitus) hgba1c too high, minimize simple carbs. Increase exercise as tolerated. Continue current meds. Has had some low sugars while leaving work. He acknowledges he had not eaten for awhile. Needs ot eat small, frequent meals with protein to keep sugars from dropping. Take Metformin bid and increase Humalog to bid.  Other and unspecified hyperlipidemia Increase Pravastain by 2 tabs weekly. Avoid trans fats, increase exercise.   Obesity, unspecified Encouraged DASH diet, decrease po intake and increase exercise as tolerated. Needs 7-8 hours of sleep nightly. Avoid trans fats, eat small, frequent meals every 4-5 hours with lean proteins, complex carbs and healthy fats. Minimize simple carbs, GMO foods.  Viral URI Encouraged increased rest and hydration, add probiotics, zinc such as Coldeze or Xicam. Treat fevers as needed

## 2014-05-26 NOTE — Assessment & Plan Note (Signed)
Well controlled, no changes to meds. Encouraged heart healthy diet such as the DASH diet and exercise as tolerated.  °

## 2014-06-06 ENCOUNTER — Other Ambulatory Visit: Payer: Self-pay | Admitting: Family Medicine

## 2014-06-06 NOTE — Telephone Encounter (Signed)
Rx sent to pharmacy. LDM 

## 2014-06-13 ENCOUNTER — Other Ambulatory Visit: Payer: Self-pay | Admitting: Family Medicine

## 2014-06-17 ENCOUNTER — Other Ambulatory Visit: Payer: Self-pay | Admitting: Family Medicine

## 2014-06-21 ENCOUNTER — Telehealth: Payer: Self-pay | Admitting: Family Medicine

## 2014-06-21 ENCOUNTER — Other Ambulatory Visit: Payer: Self-pay

## 2014-06-21 DIAGNOSIS — E118 Type 2 diabetes mellitus with unspecified complications: Secondary | ICD-10-CM

## 2014-06-21 DIAGNOSIS — E785 Hyperlipidemia, unspecified: Secondary | ICD-10-CM

## 2014-06-21 DIAGNOSIS — I10 Essential (primary) hypertension: Secondary | ICD-10-CM

## 2014-06-21 MED ORDER — INSULIN GLARGINE 100 UNIT/ML SOLOSTAR PEN: 140.0000 [IU] | PEN_INJECTOR | Freq: Every day | SUBCUTANEOUS | Status: DC

## 2014-06-21 NOTE — Telephone Encounter (Signed)
Please advise 

## 2014-06-21 NOTE — Telephone Encounter (Signed)
So I am willing to send in his Lantus this time but his A1C got worse last time it was checked and if it does not start to improve he will have to go back to endocrinology, send in a 1 month supply and 2 rfs. Needs labs in 2 months including A1C

## 2014-06-21 NOTE — Telephone Encounter (Signed)
Caller name: Izzac Relation to pt: self Call back number: [phone] [facility]  Reason for call:   Patient states that he cannot afford to keep seeing Dr. Everardo All anymore. He would like to know if you can send in a refill of lantus pen--9 boxes(79month supply)

## 2014-06-21 NOTE — Telephone Encounter (Signed)
I sent a 90 day supply to [facility]. Ordered A1C for 2 months  What other labs need to be ordered

## 2014-06-24 NOTE — Telephone Encounter (Signed)
Hgba1c, lipid, renal, cbc, ths, hepatic for DM, htn and hi cholesterol

## 2014-06-25 NOTE — Telephone Encounter (Signed)
Left a detailed message on patients vm and asked pt to call and schedule lab appt

## 2014-07-09 ENCOUNTER — Other Ambulatory Visit: Payer: Self-pay | Admitting: Family Medicine

## 2014-07-12 ENCOUNTER — Other Ambulatory Visit: Payer: Self-pay

## 2014-07-12 DIAGNOSIS — E118 Type 2 diabetes mellitus with unspecified complications: Secondary | ICD-10-CM

## 2014-07-12 MED ORDER — METFORMIN HCL 500 MG PO TABS
500.0000 mg | ORAL_TABLET | Freq: Two times a day (BID) | ORAL | Status: DC
Start: 2014-07-12 — End: 2014-08-28

## 2014-07-13 ENCOUNTER — Other Ambulatory Visit: Payer: Self-pay | Admitting: Family Medicine

## 2014-08-11 ENCOUNTER — Emergency Department (HOSPITAL_COMMUNITY)
Admission: EM | Admit: 2014-08-11 | Discharge: 2014-08-11 | Disposition: A | Discharge status: Home / Self Care | Payer: Worker's Compensation | Attending: Emergency Medicine | Admitting: Emergency Medicine

## 2014-08-11 ENCOUNTER — Emergency Department (HOSPITAL_COMMUNITY): Payer: Worker's Compensation

## 2014-08-11 ENCOUNTER — Encounter (HOSPITAL_COMMUNITY): Payer: Self-pay | Admitting: Emergency Medicine

## 2014-08-11 DIAGNOSIS — Z8619 Personal history of other infectious and parasitic diseases: Secondary | ICD-10-CM | POA: Insufficient documentation

## 2014-08-11 DIAGNOSIS — Z79899 Other long term (current) drug therapy: Secondary | ICD-10-CM | POA: Diagnosis not present

## 2014-08-11 DIAGNOSIS — Z794 Long term (current) use of insulin: Secondary | ICD-10-CM | POA: Insufficient documentation

## 2014-08-11 DIAGNOSIS — Z8669 Personal history of other diseases of the nervous system and sense organs: Secondary | ICD-10-CM | POA: Diagnosis not present

## 2014-08-11 DIAGNOSIS — Z8673 Personal history of transient ischemic attack (TIA), and cerebral infarction without residual deficits: Secondary | ICD-10-CM | POA: Diagnosis not present

## 2014-08-11 DIAGNOSIS — I10 Essential (primary) hypertension: Secondary | ICD-10-CM | POA: Insufficient documentation

## 2014-08-11 DIAGNOSIS — Z8709 Personal history of other diseases of the respiratory system: Secondary | ICD-10-CM | POA: Insufficient documentation

## 2014-08-11 DIAGNOSIS — Y9289 Other specified places as the place of occurrence of the external cause: Secondary | ICD-10-CM | POA: Diagnosis not present

## 2014-08-11 DIAGNOSIS — Z7982 Long term (current) use of aspirin: Secondary | ICD-10-CM | POA: Insufficient documentation

## 2014-08-11 DIAGNOSIS — I509 Heart failure, unspecified: Secondary | ICD-10-CM | POA: Insufficient documentation

## 2014-08-11 DIAGNOSIS — W500XXA Accidental hit or strike by another person, initial encounter: Secondary | ICD-10-CM | POA: Diagnosis not present

## 2014-08-11 DIAGNOSIS — E785 Hyperlipidemia, unspecified: Secondary | ICD-10-CM | POA: Insufficient documentation

## 2014-08-11 DIAGNOSIS — S8992XA Unspecified injury of left lower leg, initial encounter: Secondary | ICD-10-CM | POA: Insufficient documentation

## 2014-08-11 DIAGNOSIS — Z96651 Presence of right artificial knee joint: Secondary | ICD-10-CM | POA: Insufficient documentation

## 2014-08-11 DIAGNOSIS — E669 Obesity, unspecified: Secondary | ICD-10-CM | POA: Diagnosis not present

## 2014-08-11 DIAGNOSIS — Y9389 Activity, other specified: Secondary | ICD-10-CM | POA: Diagnosis not present

## 2014-08-11 DIAGNOSIS — F329 Major depressive disorder, single episode, unspecified: Secondary | ICD-10-CM | POA: Insufficient documentation

## 2014-08-11 DIAGNOSIS — Z9889 Other specified postprocedural states: Secondary | ICD-10-CM | POA: Insufficient documentation

## 2014-08-11 DIAGNOSIS — M1990 Unspecified osteoarthritis, unspecified site: Secondary | ICD-10-CM | POA: Diagnosis not present

## 2014-08-11 DIAGNOSIS — E119 Type 2 diabetes mellitus without complications: Secondary | ICD-10-CM | POA: Insufficient documentation

## 2014-08-11 DIAGNOSIS — Z720 Tobacco use: Secondary | ICD-10-CM | POA: Diagnosis not present

## 2014-08-11 DIAGNOSIS — Z7951 Long term (current) use of inhaled steroids: Secondary | ICD-10-CM | POA: Insufficient documentation

## 2014-08-11 NOTE — ED Notes (Signed)
Pt c/o left knee pain after person fell on leg when trying to help them out of elevator that was stuck; pt ambulatory with limp

## 2014-08-11 NOTE — ED Provider Notes (Signed)
CSN: [id number]     Arrival date & time 08/11/14  0807 History   First MD Initiated Contact with Patient 08/11/14 0815     Chief Complaint  Patient presents with  . Knee Pain     (Consider location/radiation/quality/duration/timing/severity/associated sxs/prior Treatment) HPI Comments: Patient presents with complaint of left knee pain began acutely when another person fell on his knee this morning. Patient has had pain in his knee that is worse with ambulation and weightbearing, however he is ambulatory. No distal numbness, weakness, or tingling. No treatments prior to arrival. Patient has a history of a right total knee replacement.  The history is provided by the patient.    Past Medical History  Diagnosis Date  . Arthritis   . Depression   . History of chronic bronchitis     dx 2011  . Seasonal allergies   . Aortic stenosis   . Hypertension   . Diabetes mellitus, type 2   . Hyperlipidemia   . History of stroke 1992    blood clot at base of brain-[facility]  . Congestive heart failure     September 2011-CHF  . History of chicken pox   . Obesity, unspecified 11/27/2012  . Tobacco abuse 11/27/2012    Down from 3 ppd to 1/2 ppd  . Allergic state 02/08/2013  . Acute low back pain with radicular symptoms, duration less than 6 weeks 05/16/2013  . Viral URI 05/26/2014  . Unspecified hereditary and idiopathic peripheral neuropathy 05/26/2014    Tingling in toes   Past Surgical History  Procedure Laterality Date  . Knee surgery  1976, 1989    right knee torn ligament repair  . Lumbar fusion  1982    L4-L5  . Revision total knee arthroplasty  2007    right knee replacement   Family History  Problem Relation Age of Onset  . Arthritis      mother/father/paternal grandparents  . Heart disease Father     pacemaker  . Hypertension Father   . Hypertension Paternal Grandfather   . Hypertension Brother   . Emotional abuse Mother   . Diabetes Brother      x 2  . Diabetes  Paternal Grandmother    History  Substance Use Topics  . Smoking status: Current Every Day Smoker -- 1.00 packs/day  . Smokeless tobacco: Never Used     Comment: smokes a pack per day-started in 1972  . Alcohol Use: No    Review of Systems  Constitutional: Negative for activity change.  Musculoskeletal: Positive for arthralgias, gait problem and joint swelling. Negative for back pain and neck pain.  Skin: Negative for wound.  Neurological: Negative for weakness and numbness.   Allergies  Review of patient's allergies indicates no known allergies.  Home Medications   Prior to Admission medications   Medication Sig Start Date End Date Taking? Authorizing Provider  albuterol (PROVENTIL HFA;VENTOLIN HFA) 108 (90 BASE) MCG/ACT inhaler Inhale 2 puffs into the lungs every 6 (six) hours as needed for wheezing. 02/29/12 05/23/14  Edwyna Perfecthomas W Hodgin, MD  amLODipine (NORVASC) 10 MG tablet TAKE 1 TABLET DAILY 06/13/14   Bradd CanaryStacey A Blyth, MD  aspirin 81 MG tablet Take 81 mg by mouth daily.      Historical Provider, MD  carvedilol (COREG) 25 MG tablet TAKE ONE AND ONE-HALF TABLETS TWICE A DAY 06/06/14   Bradd CanaryStacey A Blyth, MD  fluticasone (FLONASE) 50 MCG/ACT nasal spray Place 2 sprays into the nose daily as needed for  rhinitis or allergies. 02/08/13   Bradd CanaryStacey A Blyth, MD  furosemide (LASIX) 40 MG tablet Take 0.5 tablets (20 mg total) by mouth daily. 01/07/14   Bradd CanaryStacey A Blyth, MD  gemfibrozil (LOPID) 600 MG tablet TAKE 1 TABLET TWICE A DAY BEFORE MEALS 07/15/14   Bradd CanaryStacey A Blyth, MD  Insulin Glargine (LANTUS SOLOSTAR) 100 UNIT/ML Solostar Pen Inject 140 Units into the skin daily at 10 pm. 06/21/14   Bradd CanaryStacey A Blyth, MD  insulin lispro (HUMALOG) 100 UNIT/ML injection Inject 0.1 mLs (10 Units total) into the skin 2 (two) times daily. With meals 05/23/14   Bradd CanaryStacey A Blyth, MD  Insulin Pen Needle 31G X 5 MM MISC USE AS DIRECTED ONE TIME DAILY. 02/27/13   Romero BellingSean Ellison, MD  [place] LaniusKrill Oil CAPS MegaRed or a generic by schiff daily  11/27/12   Bradd CanaryStacey A Blyth, MD  lisinopril (PRINIVIL,ZESTRIL) 40 MG tablet TAKE 1 TABLET DAILY 06/17/14   Bradd CanaryStacey A Blyth, MD  metFORMIN (GLUCOPHAGE) 500 MG tablet Take 1 tablet (500 mg total) by mouth 2 (two) times daily with a meal. 07/12/14   Bradd CanaryStacey A Blyth, MD  methocarbamol (ROBAXIN) 500 MG tablet Take 1 tablet (500 mg total) by mouth 2 (two) times daily as needed (pain, muscle spasm). During the day 01/07/14   Bradd CanaryStacey A Blyth, MD  Multiple Vitamin (MULTIVITAMIN) tablet Take 1 tablet by mouth daily.      Historical Provider, MD  pravastatin (PRAVACHOL) 20 MG tablet TAKE 1 TABLET DAILY 07/15/14   Bradd CanaryStacey A Blyth, MD  sertraline (ZOLOFT) 100 MG tablet TAKE ONE AND ONE-HALF TABLETS DAILY 07/15/14   Bradd CanaryStacey A Blyth, MD  triamterene-hydrochlorothiazide (MAXZIDE-25) 37.5-25 MG per tablet TAKE ONE TABLET BY MOUTH ONCE DAILY 07/11/14   Bradd CanaryStacey A Blyth, MD  vardenafil (LEVITRA) 10 MG tablet Take 10 mg by mouth daily as needed. 03/17/12   Edwyna Perfecthomas W Hodgin, MD   BP 154/82  Pulse 73  Resp 18  SpO2 100%  Physical Exam  Nursing note and vitals reviewed. Constitutional: He appears well-developed and well-nourished.  HENT:  Head: Normocephalic and atraumatic.  Eyes: Conjunctivae are normal.  Neck: Normal range of motion. Neck supple.  Cardiovascular: Normal pulses.   Musculoskeletal: He exhibits edema and tenderness.       Left hip: Normal.       Left knee: He exhibits effusion (mild). He exhibits normal range of motion, no ecchymosis and no deformity. Tenderness found. Medial joint line and lateral joint line tenderness noted.       Left ankle: Normal.  Neurological: He is alert. No sensory deficit.  Motor, sensation, and vascular distal to the injury is fully intact.   Skin: Skin is warm and dry.  Psychiatric: He has a normal mood and affect.    ED Course  Procedures (including critical care time) Labs Review Labs Reviewed - No data to display  Imaging Review No results found.   EKG  Interpretation None      8:31 AM Patient seen and examined. X-ray ordered.  Vital signs reviewed and are as follows: BP 154/82  Pulse 73  Resp 18  SpO2 100%  9:43 AM patient informed of negative x-ray results. Counseled on rice protocol. Will give knee sleeve. Patient counseled to do limited walking at work for the next 5 days. He is to follow-up with orthopedist if not improved in 1 week.  MDM   Final diagnoses:  Knee injury, left, initial encounter   Patient was knee injury. X-ray negative. Likely knee  sprain. Conservative management indicated with orthopedic follow-up if not improving. Lower extremity is neurovascularly intact. No other injuries.   Renne Crigler, PA-C 08/11/14 [phone]

## 2014-08-11 NOTE — ED Provider Notes (Signed)
Medical screening examination/treatment/procedure(s) were performed by non-physician practitioner and as supervising physician I was immediately available for consultation/collaboration.   EKG Interpretation None        Gwyneth SproutWhitney Foch Rosenwald, MD 08/11/14 1625

## 2014-08-11 NOTE — Discharge Instructions (Signed)
Please read and follow all provided instructions.  Your diagnoses today include:  1. Knee injury, left, initial encounter     Tests performed today include:  An x-ray of the affected area - does NOT show any broken bones  Vital signs. See below for your results today.   Medications prescribed:   None  Take any prescribed medications only as directed.  Home care instructions:   Follow any educational materials contained in this packet  Follow R.I.C.E. Protocol:  R - rest your injury   I  - use ice on injury without applying directly to skin  C - compress injury with bandage or splint  E - elevate the injury as much as possible  Follow-up instructions: Please follow-up with your primary care provider or the provided orthopedic physician (bone specialist) if you continue to have significant pain or trouble walking in 1 week. In this case you may have a severe injury that requires further care.   Return instructions:   Please return if your toes are numb or tingling, appear gray or blue, or you have severe pain (also elevate leg and loosen splint or wrap if you were given one)  Please return to the Emergency Department if you experience worsening symptoms.   Please return if you have any other emergent concerns.  Additional Information:  Your vital signs today were: BP 154/82   Pulse 73   Resp 18   SpO2 100% If your blood pressure (BP) was elevated above 135/85 this visit, please have this repeated by your doctor within one month. -------------- If prescribed crutches for your injury: use crutches with non-weight bearing for the first few days. Then, you may walk as the pain allows, or as instructed. Start gradually with weight bearing on the affected side. Once you can walk pain free, then try jogging. When you can run forwards, then you can try moving side-to-side. If you cannot walk without crutches in one week, you need a re-check. --------------

## 2014-08-16 ENCOUNTER — Other Ambulatory Visit (INDEPENDENT_AMBULATORY_CARE_PROVIDER_SITE_OTHER): Payer: Commercial Managed Care - PPO

## 2014-08-16 DIAGNOSIS — E118 Type 2 diabetes mellitus with unspecified complications: Secondary | ICD-10-CM

## 2014-08-16 DIAGNOSIS — I10 Essential (primary) hypertension: Secondary | ICD-10-CM

## 2014-08-16 DIAGNOSIS — E785 Hyperlipidemia, unspecified: Secondary | ICD-10-CM

## 2014-08-16 LAB — CBC
HEMATOCRIT: 44.6 % (ref 39.0–52.0)
Hemoglobin: 15.1 g/dL (ref 13.0–17.0)
MCHC: 33.8 g/dL (ref 30.0–36.0)
MCV: 94.7 fl (ref 78.0–100.0)
Platelets: 211 10*3/uL (ref 150.0–400.0)
RBC: 4.71 Mil/uL (ref 4.22–5.81)
RDW: 13.6 % (ref 11.5–15.5)
WBC: 12.3 10*3/uL — AB (ref 4.0–10.5)

## 2014-08-16 LAB — LIPID PANEL
CHOL/HDL RATIO: 7
Cholesterol: 167 mg/dL (ref 0–200)
HDL: 24.8 mg/dL — AB (ref >39.00)
NONHDL: 142.2
Triglycerides: 485 mg/dL — ABNORMAL HIGH (ref 0.0–149.0)
VLDL: 97 mg/dL — ABNORMAL HIGH (ref 0.0–40.0)

## 2014-08-16 LAB — HEPATIC FUNCTION PANEL
ALBUMIN: 4.1 g/dL (ref 3.5–5.2)
ALT: 18 U/L (ref 0–53)
AST: 16 U/L (ref 0–37)
Alkaline Phosphatase: 70 U/L (ref 39–117)
Bilirubin, Direct: 0 mg/dL (ref 0.0–0.3)
Total Bilirubin: 0.5 mg/dL (ref 0.2–1.2)
Total Protein: 7.5 g/dL (ref 6.0–8.3)

## 2014-08-16 LAB — RENAL FUNCTION PANEL
Albumin: 4.1 g/dL (ref 3.5–5.2)
BUN: 37 mg/dL — ABNORMAL HIGH (ref 6–23)
CALCIUM: 9.7 mg/dL (ref 8.4–10.5)
CO2: 27 mEq/L (ref 19–32)
CREATININE: 1.6 mg/dL — AB (ref 0.4–1.5)
Chloride: 96 mEq/L (ref 96–112)
GFR: 48.25 mL/min — ABNORMAL LOW (ref >60.00)
GLUCOSE: 156 mg/dL — AB (ref 70–99)
Phosphorus: 3.6 mg/dL (ref 2.3–4.6)
Potassium: 4.5 mEq/L (ref 3.5–5.1)
SODIUM: 133 meq/L — AB (ref 135–145)

## 2014-08-16 LAB — HEMOGLOBIN A1C: HEMOGLOBIN A1C: 9.4 % — AB (ref 4.6–6.5)

## 2014-08-16 LAB — TSH: TSH: 0.37 u[IU]/mL (ref 0.35–4.50)

## 2014-08-16 LAB — LDL CHOLESTEROL, DIRECT: LDL DIRECT: 76.3 mg/dL

## 2014-08-26 ENCOUNTER — Ambulatory Visit (INDEPENDENT_AMBULATORY_CARE_PROVIDER_SITE_OTHER): Payer: Commercial Managed Care - PPO | Admitting: Family Medicine

## 2014-08-26 ENCOUNTER — Ambulatory Visit (INDEPENDENT_AMBULATORY_CARE_PROVIDER_SITE_OTHER): Payer: Commercial Managed Care - PPO

## 2014-08-26 ENCOUNTER — Encounter: Payer: Self-pay | Admitting: Family Medicine

## 2014-08-26 VITALS — BP 133/76 | HR 91 | Temp 97.6°F | Ht 74.0 in | Wt 255.6 lb

## 2014-08-26 DIAGNOSIS — E118 Type 2 diabetes mellitus with unspecified complications: Secondary | ICD-10-CM

## 2014-08-26 DIAGNOSIS — Z23 Encounter for immunization: Secondary | ICD-10-CM

## 2014-08-26 DIAGNOSIS — Z720 Tobacco use: Secondary | ICD-10-CM

## 2014-08-26 DIAGNOSIS — I10 Essential (primary) hypertension: Secondary | ICD-10-CM

## 2014-08-26 DIAGNOSIS — E785 Hyperlipidemia, unspecified: Secondary | ICD-10-CM

## 2014-08-26 DIAGNOSIS — E669 Obesity, unspecified: Secondary | ICD-10-CM

## 2014-08-26 MED ORDER — PRAVASTATIN SODIUM 40 MG PO TABS: 40.0000 mg | ORAL_TABLET | Freq: Every day | ORAL | Status: DC

## 2014-08-26 NOTE — Progress Notes (Signed)
Pre visit review using our clinic review tool, if applicable. No additional management support is needed unless otherwise documented below in the visit note. 

## 2014-08-26 NOTE — Progress Notes (Signed)
Patient ID: Gabriel [name], male   DOB: 02/14/1951, 63 y.o.   MRN:Gabriel [name] [id number] Gabriel SalvageRaymon [name] [id number] 02/14/1951 08/26/2014      Progress Note-Follow Up  Subjective  Chief Complaint  Chief Complaint  Patient presents with  . Follow-up    3 month  . Injections    flu    HPI  Patient is a 63 year old male in today for routine medical care. He has been under a great deal of stress. His 61 year old daughter fell down some stairs and is paralyzed from the waist down. He has been living with his brother but his brother's mother in law had a stroke and he had to move out and he continues 10 cig a day. No recent illness. Denies CP/palp/SOB/HA/congestion/fevers/GI or GU c/o. Taking meds as prescribed. Fell and hyperextended knee but it is feeling better now. Past Medical History  Diagnosis Date  . Arthritis   . Depression   . History of chronic bronchitis     dx 2011  . Seasonal allergies   . Aortic stenosis   . Hypertension   . Diabetes mellitus, type 2   . Hyperlipidemia   . History of stroke 1992    blood clot at base of brain-[facility]  . Congestive heart failure     September 2011-CHF  . History of chicken pox   . Obesity, unspecified 11/27/2012  . Tobacco abuse 11/27/2012    Down from 3 ppd to 1/2 ppd  . Allergic state 02/08/2013  . Acute low back pain with radicular symptoms, duration less than 6 weeks 05/16/2013  . Viral URI 05/26/2014  . Unspecified hereditary and idiopathic peripheral neuropathy 05/26/2014    Tingling in toes    Past Surgical History  Procedure Laterality Date  . Knee surgery  1976, 1989    right knee torn ligament repair  . Lumbar fusion  1982    L4-L5  . Revision total knee arthroplasty  2007    right knee replacement    Family History  Problem Relation Age of Onset  . Arthritis      mother/father/paternal grandparents  . Heart disease Father     pacemaker  . Hypertension Father   . Hypertension Paternal Grandfather   . Hypertension  Brother   . Emotional abuse Mother   . Diabetes Brother      x 2  . Diabetes Paternal Grandmother     History   Social History  . Marital Status: Widowed    Spouse Name: N/A    Number of Children: 2  . 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 Smoking status: Current Every Day Smoker -- 1.00 packs/day  . Smokeless tobacco: Never Used     Comment: smokes a pack per day-started in 1972  . Alcohol Use: No  . Drug Use: No  . Sexual Activity: Not on file   Other Topics Concern  . Not on file   Social History Narrative   Regular exercise-no   Caffeine Use-yes          Current Outpatient Prescriptions on File Prior to Visit  Medication Sig Dispense Refill  . albuterol (PROVENTIL HFA;VENTOLIN HFA) 108 (90 BASE) MCG/ACT inhaler Inhale 2 puffs into the lungs every 6 (six) hours as needed for wheezing. 1 Inhaler 0  . amLODipine (NORVASC) 10 MG tablet TAKE 1 TABLET DAILY 90 tablet 1  . aspirin 81  MG tablet Take 81 mg by mouth daily.      . carvedilol (COREG) 25 MG tablet TAKE ONE AND ONE-HALF TABLETS TWICE A DAY 270 tablet 1  . fluticasone (FLONASE) 50 MCG/ACT nasal spray Place 2 sprays into the nose daily as needed for rhinitis or allergies. 16 g 6  . furosemide (LASIX) 40 MG tablet Take 0.5 tablets (20 mg total) by mouth daily. 45 tablet 3  . gemfibrozil (LOPID) 600 MG tablet TAKE 1 TABLET TWICE A DAY BEFORE MEALS 180 tablet 0  . Insulin Glargine (LANTUS SOLOSTAR) 100 UNIT/ML Solostar Pen Inject 140 Units into the skin daily at 10 pm. 45 pen 0  . insulin lispro (HUMALOG) 100 UNIT/ML injection Inject 0.1 mLs (10 Units total) into the skin 2 (two) times daily. With meals 15 mL 1  . Insulin Pen Needle 31G X 5 MM MISC USE AS DIRECTED ONE TIME DAILY. 100 each prn  . Krill Oil CAPS MegaRed or a generic by schiff daily    . lisinopril (PRINIVIL,ZESTRIL) 40 MG tablet TAKE 1 TABLET DAILY 90 tablet 1  . metFORMIN  (GLUCOPHAGE) 500 MG tablet Take 1 tablet (500 mg total) by mouth 2 (two) times daily with a meal. 180 tablet 2  . methocarbamol (ROBAXIN) 500 MG tablet Take 1 tablet (500 mg total) by mouth 2 (two) times daily as needed (pain, muscle spasm). During the day 60 tablet 1  . Multiple Vitamin (MULTIVITAMIN) tablet Take 1 tablet by mouth daily.      . pravastatin (PRAVACHOL) 20 MG tablet TAKE 1 TABLET DAILY 90 tablet 0  . sertraline (ZOLOFT) 100 MG tablet TAKE ONE AND ONE-HALF TABLETS DAILY 135 tablet 0  . triamterene-hydrochlorothiazide (MAXZIDE-25) 37.5-25 MG per tablet TAKE ONE TABLET BY MOUTH ONCE DAILY 90 tablet 0   No current facility-administered medications on file prior to visit.    No Known Allergies  Review of Systems  Review of Systems  Constitutional: Negative for fever and malaise/fatigue.  HENT: Negative for congestion.   Eyes: Negative for discharge.  Respiratory: Negative for shortness of breath.   Cardiovascular: Negative for chest pain, palpitations and leg swelling.  Gastrointestinal: Negative for nausea, abdominal pain and diarrhea.  Genitourinary: Negative for dysuria.  Musculoskeletal: Negative for falls.  Skin: Negative for rash.  Neurological: Negative for loss of consciousness and headaches.  Endo/Heme/Allergies: Negative for polydipsia.  Psychiatric/Behavioral: Negative for depression and suicidal ideas. The patient is nervous/anxious. The patient does not have insomnia.     Objective  BP 133/76 mmHg  Pulse 91  Temp(Src) 97.6 F (36.4 C) (Oral)  Ht 6\' 2"  (1.88 m)  Wt 255 lb 9.6 oz (115.939 kg)  BMI 32.80 kg/m2  SpO2 100%  Physical Exam  Physical Exam  Constitutional: He is oriented to person, place, and time and well-developed, well-nourished, and in no distress. No distress.  HENT:  Head: Normocephalic and atraumatic.  Eyes: Conjunctivae are normal.  Neck: Neck supple. No thyromegaly present.  Cardiovascular: Normal rate, regular rhythm and normal  heart sounds.   No murmur heard. Pulmonary/Chest: Effort normal and breath sounds normal. No respiratory distress.  Abdominal: He exhibits no distension and no mass. There is no tenderness.  Musculoskeletal: He exhibits no edema.  Neurological: He is alert and oriented to person, place, and time.  Skin: Skin is warm.  Psychiatric: Memory, affect and judgment normal.    Lab Results  Component Value Date   TSH 0.37 08/16/2014   Lab Results  Component Value Date  WBC 12.3* 08/16/2014   HGB 15.1 08/16/2014   HCT 44.6 08/16/2014   MCV 94.7 08/16/2014   PLT 211.0 08/16/2014   Lab Results  Component Value Date   CREATININE 1.6* 08/16/2014   BUN 37* 08/16/2014   NA 133* 08/16/2014   K 4.5 08/16/2014   CL 96 08/16/2014   CO2 27 08/16/2014   Lab Results  Component Value Date   ALT 18 08/16/2014   AST 16 08/16/2014   ALKPHOS 70 08/16/2014   BILITOT 0.5 08/16/2014   Lab Results  Component Value Date   CHOL 167 08/16/2014   Lab Results  Component Value Date   HDL 24.80* 08/16/2014   Lab Results  Component Value Date   LDLCALC NOT CALC 05/15/2014   Lab Results  Component Value Date   TRIG 485.0* 08/16/2014   Lab Results  Component Value Date   CHOLHDL 7 08/16/2014     Assessment & Plan  HTN (hypertension) Well controlled, no changes to meds. Encouraged heart healthy diet such as the DASH diet and exercise as tolerated.   DM (diabetes mellitus) hgba1c unacceptable, minimize simple carbs. Increase exercise as tolerated. Continue current meds, has an appt with endocrinology. Lowest number 78 and highest was 209 in past 2 weeks. Average around 140s  Obesity Encouraged DASH diet, decrease po intake and increase exercise as tolerated. Needs 7-8 hours of sleep nightly. Avoid trans fats, eat small, frequent meals every 4-5 hours with lean proteins, complex carbs and healthy fats. Minimize simple carbs, GMO foods.  Tobacco abuse Encouraged complete cessation.  Discussed need to quit as relates to risk of numerous cancers, cardiac and pulmonary disease as well as neurologic complications. Counseled for greater than 3 minutes

## 2014-08-26 NOTE — Assessment & Plan Note (Signed)
Encouraged DASH diet, decrease po intake and increase exercise as tolerated. Needs 7-8 hours of sleep nightly. Avoid trans fats, eat small, frequent meals every 4-5 hours with lean proteins, complex carbs and healthy fats. Minimize simple carbs, GMO foods. 

## 2014-08-26 NOTE — Patient Instructions (Signed)

## 2014-08-26 NOTE — Assessment & Plan Note (Signed)
Encouraged complete cessation. Discussed need to quit as relates to risk of numerous cancers, cardiac and pulmonary disease as well as neurologic complications. Counseled for greater than 3 minutes 

## 2014-08-26 NOTE — Assessment & Plan Note (Signed)
hgba1c unacceptable, minimize simple carbs. Increase exercise as tolerated. Continue current meds, has an appt with endocrinology. Lowest number 78 and highest was 209 in past 2 weeks. Average around 140s

## 2014-08-26 NOTE — Assessment & Plan Note (Signed)
Well controlled, no changes to meds. Encouraged heart healthy diet such as the DASH diet and exercise as tolerated.  °

## 2014-08-28 ENCOUNTER — Encounter: Payer: Self-pay | Admitting: Endocrinology

## 2014-08-28 ENCOUNTER — Ambulatory Visit (INDEPENDENT_AMBULATORY_CARE_PROVIDER_SITE_OTHER): Payer: Commercial Managed Care - PPO | Admitting: Endocrinology

## 2014-08-28 VITALS — BP 138/96 | HR 89 | Temp 97.7°F | Ht 74.0 in | Wt 254.0 lb

## 2014-08-28 DIAGNOSIS — E119 Type 2 diabetes mellitus without complications: Secondary | ICD-10-CM | POA: Insufficient documentation

## 2014-08-28 DIAGNOSIS — E1122 Type 2 diabetes mellitus with diabetic chronic kidney disease: Secondary | ICD-10-CM

## 2014-08-28 DIAGNOSIS — N189 Chronic kidney disease, unspecified: Secondary | ICD-10-CM

## 2014-08-28 MED ORDER — INSULIN GLARGINE 100 UNIT/ML SOLOSTAR PEN: 120.0000 [IU] | PEN_INJECTOR | Freq: Every day | SUBCUTANEOUS | Status: DC

## 2014-08-28 MED ORDER — INSULIN LISPRO 100 UNIT/ML (KWIKPEN): 30.0000 [IU] | PEN_INJECTOR | Freq: Three times a day (TID) | SUBCUTANEOUS | Status: DC

## 2014-08-28 NOTE — Progress Notes (Signed)
Subjective:    Patient ID: Gabriel [name], male    DOB: Feb 04, 1951, 63 y.o.   MRN: [id number]  HPI Pt returns for f/u of diabetes mellitus: DM type: Insulin-requiring type 2 Dx'ed: 1994 Complications: CVA and renal insufficiency Therapy: insulin since 2000 DKA: never Severe hypoglycemia: never Pancreatitis: never   Other: he works 3rd shift; he takes multiple daily injections;  Interval history: no cbg record, but states cbg's are higher after eating, and mildly low if he has not eaten in some hours.  This happens approx once per week; pt states he feels well in general.  He does not take the metformin.  Pt says he has misses his BP meds.  Past Medical History  Diagnosis Date  . Arthritis   . Depression   . History of chronic bronchitis     dx 2011  . Seasonal allergies   . Aortic stenosis   . Hypertension   . Diabetes mellitus, type 2   . Hyperlipidemia   . History of stroke 1992    blood clot at base of brain-[facility]  . Congestive heart failure     September 2011-CHF  . History of chicken pox   . Obesity, unspecified 11/27/2012  . Tobacco abuse 11/27/2012    Down from 3 ppd to 1/2 ppd  . Allergic state 02/08/2013  . Acute low back pain with radicular symptoms, duration less than 6 weeks 05/16/2013  . Viral URI 05/26/2014  . Unspecified hereditary and idiopathic peripheral neuropathy 05/26/2014    Tingling in toes    Past Surgical History  Procedure Laterality Date  . Knee surgery  1976, 1989    right knee torn ligament repair  . Lumbar fusion  1982    L4-L5  . Revision total knee arthroplasty  2007    right knee replacement    History   Social History  . Marital Status: Widowed    Spouse Name: N/A    Number of Children: 2  . 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 Smoking status: Current Every Day Smoker -- 1.00 packs/day  . Smokeless tobacco: Never Used     Comment: smokes a  pack per day-started in 1972  . Alcohol Use: No  . Drug Use: No  . Sexual Activity: Not on file   Other Topics Concern  . Not on file   Social History Narrative   Regular exercise-no   Caffeine Use-yes          Current Outpatient Prescriptions on File Prior to Visit  Medication Sig Dispense Refill  . amLODipine (NORVASC) 10 MG tablet TAKE 1 TABLET DAILY 90 tablet 1  . aspirin 81 MG tablet Take 81 mg by mouth daily.      . carvedilol (COREG) 25 MG tablet TAKE ONE AND ONE-HALF TABLETS TWICE A DAY 270 tablet 1  . fluticasone (FLONASE) 50 MCG/ACT nasal spray Place 2 sprays into the nose daily as needed for rhinitis or allergies. 16 g 6  . furosemide (LASIX) 40 MG tablet Take 0.5 tablets (20 mg total) by mouth daily. 45 tablet 3  . gemfibrozil (LOPID) 600 MG tablet TAKE 1 TABLET TWICE A DAY BEFORE MEALS 180 tablet 0  . Insulin Pen Needle 31G X 5 MM MISC USE AS DIRECTED ONE TIME DAILY. 100 each prn  . Krill Oil CAPS MegaRed or a generic by schiff daily    . lisinopril (  PRINIVIL,ZESTRIL) 40 MG tablet TAKE 1 TABLET DAILY 90 tablet 1  . methocarbamol (ROBAXIN) 500 MG tablet Take 1 tablet (500 mg total) by mouth 2 (two) times daily as needed (pain, muscle spasm). During the day 60 tablet 1  . Multiple Vitamin (MULTIVITAMIN) tablet Take 1 tablet by mouth daily.      . pravastatin (PRAVACHOL) 40 MG tablet Take 1 tablet (40 mg total) by mouth daily. 90 tablet 1  . sertraline (ZOLOFT) 100 MG tablet TAKE ONE AND ONE-HALF TABLETS DAILY 135 tablet 0  . triamterene-hydrochlorothiazide (MAXZIDE-25) 37.5-25 MG per tablet TAKE ONE TABLET BY MOUTH ONCE DAILY 90 tablet 0  . albuterol (PROVENTIL HFA;VENTOLIN HFA) 108 (90 BASE) MCG/ACT inhaler Inhale 2 puffs into the lungs every 6 (six) hours as needed for wheezing. 1 Inhaler 0   No current facility-administered medications on file prior to visit.    No Known Allergies  Family History  Problem Relation Age of Onset  . Arthritis       mother/father/paternal grandparents  . Heart disease Father     pacemaker  . Hypertension Father   . Hypertension Paternal Grandfather   . Hypertension Brother   . Emotional abuse Mother   . Diabetes Brother      x 2  . Diabetes Paternal Grandmother     BP 138/96 mmHg  Pulse 89  Temp(Src) 97.7 F (36.5 C) (Oral)  Ht 6\' 2"  (1.88 m)  Wt 254 lb (115.214 kg)  BMI 32.60 kg/m2  SpO2 98%   Review of Systems He denies LOC and weight change.      Objective:   Physical Exam VITAL SIGNS:  See vs page GENERAL: no distress Pulses: dorsalis pedis intact bilat.   Feet: no deformity.  1+ bilat leg edema.  There is bilateral onychomycosis.   Skin:  no ulcer on the feet.  normal color and temp.  Spotty hyperpigmentation.   Neuro: sensation is intact to touch on the feet.        Assessment & Plan:  DM: The pattern of his cbg's indicates he needs some adjustment in his therapy HTN: mild exacerbation Noncompliance with cbg recording and BP medication: I'll work around this as best I can  Patient is advised the following: Patient Instructions  good diet and exercise habits significanly improve the control of your diabetes.  please let me know if you wish to be referred to a dietician.  high blood sugar is very risky to your health.  you should see an eye doctor and dentist every year.  It is very important to get all recommended vaccinations.  controlling your blood pressure and cholesterol drastically reduces the damage diabetes does to your body.  Those who smoke should quit.  please discuss these with your doctor.  check your blood sugar twice a day.  vary the time of day when you check, between before the 3 meals, and at bedtime.  also check if you have symptoms of your blood sugar being too high or too low.  please keep a record of the readings and bring it to your next appointment here.  You can write it on any piece of paper.  please call us sooner if your blood sugar goes below 70, or  if you have a lot of readings over 200. It is important to never miss any medications.   Please reduce the lantus to 120 units daily, and: Increase the humalog to 30 units with each meal.   Please come back for  a follow-up appointment in 1 month.

## 2014-08-28 NOTE — Patient Instructions (Addendum)
good diet and exercise habits significanly improve the control of your diabetes.  please let me know if you wish to be referred to a dietician.  high blood sugar is very risky to your health.  you should see an eye doctor and dentist every year.  It is very important to get all recommended vaccinations.  controlling your blood pressure and cholesterol drastically reduces the damage diabetes does to your body.  Those who smoke should quit.  please discuss these with your doctor.  check your blood sugar twice a day.  vary the time of day when you check, between before the 3 meals, and at bedtime.  also check if you have symptoms of your blood sugar being too high or too low.  please keep a record of the readings and bring it to your next appointment here.  You can write it on any piece of paper.  please call us sooner if your blood sugar goes below 70, or if you have a lot of readings over 200. It is important to never miss any medications.   Please reduce the lantus to 120 units daily, and: Increase the humalog to 30 units with each meal.   Please come back for a follow-up appointment in 1 month.

## 2014-08-30 ENCOUNTER — Other Ambulatory Visit: Payer: Self-pay | Admitting: Family Medicine

## 2014-09-24 ENCOUNTER — Other Ambulatory Visit: Payer: Self-pay | Admitting: Family Medicine

## 2014-09-27 ENCOUNTER — Encounter: Payer: Self-pay | Admitting: *Deleted

## 2014-09-27 ENCOUNTER — Ambulatory Visit: Payer: Self-pay | Admitting: Endocrinology

## 2014-09-27 DIAGNOSIS — I359 Nonrheumatic aortic valve disorder, unspecified: Secondary | ICD-10-CM

## 2014-09-27 NOTE — Telephone Encounter (Signed)
This encounter was created in error - please disregard.

## 2014-10-01 ENCOUNTER — Encounter: Payer: Self-pay | Admitting: Endocrinology

## 2014-10-01 ENCOUNTER — Ambulatory Visit (INDEPENDENT_AMBULATORY_CARE_PROVIDER_SITE_OTHER): Payer: Commercial Managed Care - PPO | Admitting: Endocrinology

## 2014-10-01 VITALS — BP 134/84 | HR 72 | Temp 97.7°F | Ht 74.0 in | Wt 263.0 lb

## 2014-10-01 DIAGNOSIS — E1122 Type 2 diabetes mellitus with diabetic chronic kidney disease: Secondary | ICD-10-CM

## 2014-10-01 DIAGNOSIS — N189 Chronic kidney disease, unspecified: Secondary | ICD-10-CM

## 2014-10-01 MED ORDER — INSULIN LISPRO 100 UNIT/ML (KWIKPEN): 30.0000 [IU] | PEN_INJECTOR | Freq: Three times a day (TID) | SUBCUTANEOUS | Status: DC

## 2014-10-01 MED ORDER — INSULIN GLARGINE 100 UNIT/ML SOLOSTAR PEN: 110.0000 [IU] | PEN_INJECTOR | Freq: Every day | SUBCUTANEOUS | Status: DC

## 2014-10-01 NOTE — Progress Notes (Signed)
Subjective:    Patient ID: Gabriel [name], male    DOB: May 02, 1951, 63 y.o.   MRN: [id number]  HPI Pt returns for f/u of diabetes mellitus: DM type: Insulin-requiring type 2 Dx'ed: 1994 Complications: CVA and renal insufficiency Therapy: insulin since 2000 DKA: never Severe hypoglycemia: never Pancreatitis: never   Other: he works 3rd shift; he takes multiple daily injections;  Interval history: no cbg record, but states cbg's are still higher after eating, and mildly low if he has not eaten in some hours.  He says cbg's vary from 97-128.  This happens approx once per week; pt states he feels well in general, except during the hypoglycemia.  Past Medical History  Diagnosis Date  . Arthritis   . Depression   . History of chronic bronchitis     dx 2011  . Seasonal allergies   . Aortic stenosis   . Hypertension   . Diabetes mellitus, type 2   . Hyperlipidemia   . History of stroke 1992    blood clot at base of brain-[facility]  . Congestive heart failure     September 2011-CHF  . History of chicken pox   . Obesity, unspecified 11/27/2012  . Tobacco abuse 11/27/2012    Down from 3 ppd to 1/2 ppd  . Allergic state 02/08/2013  . Acute low back pain with radicular symptoms, duration less than 6 weeks 05/16/2013  . Viral URI 05/26/2014  . Unspecified hereditary and idiopathic peripheral neuropathy 05/26/2014    Tingling in toes    Past Surgical History  Procedure Laterality Date  . Knee surgery  1976, 1989    right knee torn ligament repair  . Lumbar fusion  1982    L4-L5  . Revision total knee arthroplasty  2007    right knee replacement    History   Social History  . Marital Status: Widowed    Spouse Name: N/A    Number of Children: 2  . 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 Smoking status: Current Every Day Smoker -- 1.00 packs/day  . Smokeless tobacco: Never Used     Comment: smokes a  pack per day-started in 1972  . Alcohol Use: No  . Drug Use: No  . Sexual Activity: Not on file   Other Topics Concern  . Not on file   Social History Narrative   Regular exercise-no   Caffeine Use-yes          Current Outpatient Prescriptions on File Prior to Visit  Medication Sig Dispense Refill  . amLODipine (NORVASC) 10 MG tablet TAKE 1 TABLET DAILY 90 tablet 1  . aspirin 81 MG tablet Take 81 mg by mouth daily.      . carvedilol (COREG) 25 MG tablet TAKE ONE AND ONE-HALF TABLETS TWICE A DAY 270 tablet 1  . fluticasone (FLONASE) 50 MCG/ACT nasal spray Place 2 sprays into the nose daily as needed for rhinitis or allergies. 16 g 6  . furosemide (LASIX) 40 MG tablet Take 0.5 tablets (20 mg total) by mouth daily. 45 tablet 3  . gemfibrozil (LOPID) 600 MG tablet TAKE 1 TABLET TWICE A DAY BEFORE MEALS 180 tablet 0  . Insulin Pen Needle 31G X 5 MM MISC USE AS DIRECTED ONE TIME DAILY. 100 each prn  . Krill Oil CAPS MegaRed or a generic by schiff daily    . LANTUS SOLOSTAR 100 UNIT/ML Solostar  Pen INJECT 140 UNITS UNDER THE SKIN DAILY AT 10:00 P.M. (APPOINTMENT NEEDED FOR FURTHER REFILLS) 3 mL 1  . lisinopril (PRINIVIL,ZESTRIL) 40 MG tablet TAKE 1 TABLET DAILY 90 tablet 1  . methocarbamol (ROBAXIN) 500 MG tablet Take 1 tablet (500 mg total) by mouth 2 (two) times daily as needed (pain, muscle spasm). During the day 60 tablet 1  . Multiple Vitamin (MULTIVITAMIN) tablet Take 1 tablet by mouth daily.      . pravastatin (PRAVACHOL) 40 MG tablet Take 1 tablet (40 mg total) by mouth daily. 90 tablet 1  . sertraline (ZOLOFT) 100 MG tablet TAKE ONE AND ONE-HALF TABLETS DAILY 135 tablet 0  . triamterene-hydrochlorothiazide (MAXZIDE-25) 37.5-25 MG per tablet TAKE ONE TABLET BY MOUTH ONCE DAILY 90 tablet 0  . albuterol (PROVENTIL HFA;VENTOLIN HFA) 108 (90 BASE) MCG/ACT inhaler Inhale 2 puffs into the lungs every 6 (six) hours as needed for wheezing. 1 Inhaler 0   No current facility-administered  medications on file prior to visit.    No Known Allergies  Family History  Problem Relation Age of Onset  . Arthritis      mother/father/paternal grandparents  . Heart disease Father     pacemaker  . Hypertension Father   . Hypertension Paternal Grandfather   . Hypertension Brother   . Emotional abuse Mother   . Diabetes Brother      x 2  . Diabetes Paternal Grandmother     BP 134/84 mmHg  Pulse 72  Temp(Src) 97.7 F (36.5 C) (Oral)  Ht 6\' 2"  (1.88 m)  Wt 263 lb (119.296 kg)  BMI 33.75 kg/m2  SpO2 91%    Review of Systems Denies LOC and weight change    Objective:   Physical Exam VITAL SIGNS:  See vs page.   GENERAL: no distress.  Pulses: dorsalis pedis intact bilat.   Feet: no deformity. 1+ bilat leg edema. There is bilateral onychomycosis.   Skin: no ulcer on the feet. normal color and temp. Spotty hyperpigmentation is noted on the legs and feet.   Neuro: sensation is intact to touch on the feet.   Lab Results  Component Value Date   HGBA1C 9.4* 08/16/2014      Assessment & Plan:  DM: glycemic control is much better Occupational state: this complicates the rx of DM Side-effect of rx; hypoglycemia  Patient is advised the following: Patient Instructions  check your blood sugar twice a day.  vary the time of day when you check, between before the 3 meals, and at bedtime.  also check if you have symptoms of your blood sugar being too high or too low.  please keep a record of the readings and bring it to your next appointment here.  You can write it on any piece of paper.  please call us sooner if your blood sugar goes below 70, or if you have a lot of readings over 200.  Please reduce the lantus to 110 units daily, and:  continue the humalog, 30 units with each meal.  Please come back for a follow-up appointment in 6 weeks.

## 2014-10-01 NOTE — Patient Instructions (Addendum)
check your blood sugar twice a day.  vary the time of day when you check, between before the 3 meals, and at bedtime.  also check if you have symptoms of your blood sugar being too high or too low.  please keep a record of the readings and bring it to your next appointment here.  You can write it on any piece of paper.  please call us sooner if your blood sugar goes below 70, or if you have a lot of readings over 200.  Please reduce the lantus to 110 units daily, and:  continue the humalog, 30 units with each meal.  Please come back for a follow-up appointment in 6 weeks.

## 2014-10-22 ENCOUNTER — Other Ambulatory Visit: Payer: Self-pay | Admitting: Family Medicine

## 2014-10-23 NOTE — Telephone Encounter (Signed)
Last filled: 07/11/14 Amt: 90, 0 refills Last OV: 08/26/14   Med filled

## 2014-10-25 ENCOUNTER — Other Ambulatory Visit: Payer: Self-pay | Admitting: Family Medicine

## 2014-11-01 ENCOUNTER — Other Ambulatory Visit: Payer: Self-pay | Admitting: Family Medicine

## 2014-11-04 ENCOUNTER — Telehealth: Payer: Self-pay | Admitting: Endocrinology

## 2014-11-04 MED ORDER — INSULIN PEN NEEDLE 31G X 5 MM MISC: Status: DC

## 2014-11-04 MED ORDER — INSULIN GLARGINE 100 UNIT/ML SOLOSTAR PEN: 110.0000 [IU] | PEN_INJECTOR | Freq: Every day | SUBCUTANEOUS | Status: DC

## 2014-11-04 NOTE — Telephone Encounter (Signed)
Rx sent to pharmacy per request.  

## 2014-11-04 NOTE — Telephone Encounter (Signed)
Patient need refill of Lantus and pen needles, 30 day supply, send to [facility] on [address]

## 2014-11-05 ENCOUNTER — Other Ambulatory Visit: Payer: Self-pay | Admitting: Family Medicine

## 2014-11-07 ENCOUNTER — Other Ambulatory Visit: Payer: Self-pay | Admitting: *Deleted

## 2014-11-07 MED ORDER — SERTRALINE HCL 100 MG PO TABS: 150.0000 mg | ORAL_TABLET | Freq: Every day | ORAL | Status: DC

## 2014-11-07 MED ORDER — GEMFIBROZIL 600 MG PO TABS: ORAL_TABLET | ORAL | Status: DC

## 2014-11-07 MED ORDER — AMLODIPINE BESYLATE 10 MG PO TABS: 10.0000 mg | ORAL_TABLET | Freq: Every day | ORAL | Status: DC

## 2014-11-07 MED ORDER — FUROSEMIDE 40 MG PO TABS: 20.0000 mg | ORAL_TABLET | Freq: Every day | ORAL | Status: DC

## 2014-11-07 NOTE — Telephone Encounter (Signed)
Rx sent to the pharmacy by e-script.//AB/CMA 

## 2014-11-12 ENCOUNTER — Ambulatory Visit: Payer: Self-pay | Admitting: Endocrinology

## 2014-11-14 ENCOUNTER — Ambulatory Visit: Payer: Self-pay | Admitting: Endocrinology

## 2014-11-22 ENCOUNTER — Encounter: Payer: Self-pay | Admitting: Endocrinology

## 2014-11-22 ENCOUNTER — Ambulatory Visit (INDEPENDENT_AMBULATORY_CARE_PROVIDER_SITE_OTHER): Payer: Commercial Managed Care - PPO | Admitting: Endocrinology

## 2014-11-22 VITALS — BP 134/86 | HR 73 | Temp 97.9°F | Ht 74.0 in | Wt 253.0 lb

## 2014-11-22 DIAGNOSIS — E1059 Type 1 diabetes mellitus with other circulatory complications: Secondary | ICD-10-CM

## 2014-11-22 DIAGNOSIS — E1051 Type 1 diabetes mellitus with diabetic peripheral angiopathy without gangrene: Secondary | ICD-10-CM

## 2014-11-22 LAB — MICROALBUMIN / CREATININE URINE RATIO
Creatinine,U: 43.4 mg/dL
Microalb Creat Ratio: 39.6 mg/g — ABNORMAL HIGH (ref 0.0–30.0)
Microalb, Ur: 17.2 mg/dL — ABNORMAL HIGH (ref 0.0–1.9)

## 2014-11-22 LAB — HEMOGLOBIN A1C: Hgb A1c MFr Bld: 10.2 % — ABNORMAL HIGH (ref 4.6–6.5)

## 2014-11-22 NOTE — Progress Notes (Signed)
Subjective:    Patient ID: Gabriel [name], male    DOB: [birth year], 64 y.o.   MRN: [id number]  HPI Pt returns for f/u of diabetes mellitus: DM type: Insulin-requiring type 2 Dx'ed: 1994 Complications: CVA and renal insufficiency Therapy: insulin since 2000 DKA: never Severe hypoglycemia: never Pancreatitis: never   Other: he works 3rd shift; he takes multiple daily injections;  Interval history: Since last ov, he has had only 1 episode of hypoglycemia, and this was mild.  This happened when he took humalog, but did not eat.  It it otherwise well-controlled.  no cbg record, but he says cbg's are still higher after eating than if he has had eaten in a while.  Past Medical History  Diagnosis Date  . Arthritis   . Depression   . History of chronic bronchitis     dx 2011  . Seasonal allergies   . Aortic stenosis   . Hypertension   . Diabetes mellitus, type 2   . Hyperlipidemia   . History of stroke 1992    blood clot at base of brain-[facility]  . Congestive heart failure     September 2011-CHF  . History of chicken pox   . Obesity, unspecified 11/27/2012  . Tobacco abuse 11/27/2012    Down from 3 ppd to 1/2 ppd  . Allergic state 02/08/2013  . Acute low back pain with radicular symptoms, duration less than 6 weeks 05/16/2013  . Viral URI 05/26/2014  . Unspecified hereditary and idiopathic peripheral neuropathy 05/26/2014    Tingling in toes    Past Surgical History  Procedure Laterality Date  . Knee surgery  1976, 1989    right knee torn ligament repair  . Lumbar fusion  1982    L4-L5  . Revision total knee arthroplasty  2007    right knee replacement    History   Social History  . Marital Status: Widowed    Spouse Name: N/A    Number of Children: 2  . 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 Smoking status: Current Every Day Smoker -- 1.00 packs/day  . Smokeless tobacco: Never Used   Comment: smokes a pack per day-started in 1972  . Alcohol Use: No  . Drug Use: No  . Sexual Activity: Not on file   Other Topics Concern  . Not on file   Social History Narrative   Regular exercise-no   Caffeine Use-yes          Current Outpatient Prescriptions on File Prior to Visit  Medication Sig Dispense Refill  . amLODipine (NORVASC) 10 MG tablet Take 1 tablet (10 mg total) by mouth daily. 90 tablet 1  . aspirin 81 MG tablet Take 81 mg by mouth daily.      . carvedilol (COREG) 25 MG tablet TAKE ONE AND ONE-HALF TABLETS TWICE A DAY 270 tablet 1  . fluticasone (FLONASE) 50 MCG/ACT nasal spray Place 2 sprays into the nose daily as needed for rhinitis or allergies. 16 g 6  . furosemide (LASIX) 40 MG tablet Take 0.5 tablets (20 mg total) by mouth daily. 45 tablet 3  . gemfibrozil (LOPID) 600 MG tablet TAKE 1 TABLET TWICE A DAY BEFORE MEALS 180 tablet 1  . Insulin Glargine (LANTUS SOLOSTAR) 100 UNIT/ML Solostar Pen Inject 110 Units into the skin daily. 15 pen 1  . insulin lispro (HUMALOG KWIKPEN) 100 UNIT/ML KiwkPen Inject 0.3 mLs (  30 Units total) into the skin 3 (three) times daily with meals. and pen needles 6/day (Patient taking differently: Inject 40 Units into the skin 3 (three) times daily with meals. and pen needles 6/day) 90 mL 11  . Insulin Pen Needle 31G X 5 MM MISC USE AS DIRECTED ONE TIME DAILY. 150 each 1  . Krill Oil CAPS MegaRed or a generic by schiff daily    . LANTUS SOLOSTAR 100 UNIT/ML Solostar Pen INJECT 140 UNITS UNDER THE SKIN DAILY AT 10:00 P.M. (APPOINTMENT NEEDED FOR FURTHER REFILLS) 3 mL 1  . lisinopril (PRINIVIL,ZESTRIL) 40 MG tablet TAKE 1 TABLET DAILY 90 tablet 1  . methocarbamol (ROBAXIN) 500 MG tablet Take 1 tablet (500 mg total) by mouth 2 (two) times daily as needed (pain, muscle spasm). During the day 60 tablet 1  . Multiple Vitamin (MULTIVITAMIN) tablet Take 1 tablet by mouth daily.      . pravastatin (PRAVACHOL) 40 MG tablet Take 1 tablet (40 mg  total) by mouth daily. 90 tablet 1  . sertraline (ZOLOFT) 100 MG tablet Take 1.5 tablets (150 mg total) by mouth daily. 135 tablet 1  . triamterene-hydrochlorothiazide (MAXZIDE-25) 37.5-25 MG per tablet TAKE ONE TABLET BY MOUTH ONCE DAILY 90 tablet 1  . albuterol (PROVENTIL HFA;VENTOLIN HFA) 108 (90 BASE) MCG/ACT inhaler Inhale 2 puffs into the lungs every 6 (six) hours as needed for wheezing. 1 Inhaler 0   No current facility-administered medications on file prior to visit.    No Known Allergies  Family History  Problem Relation Age of Onset  . Arthritis      mother/father/paternal grandparents  . Heart disease Father     pacemaker  . Hypertension Father   . Hypertension Paternal Grandfather   . Hypertension Brother   . Emotional abuse Mother   . Diabetes Brother      x 2  . Diabetes Paternal Grandmother     BP 134/86 mmHg  Pulse 73  Temp(Src) 97.9 F (36.6 C) (Oral)  Ht 6\' 2"  (1.88 m)  Wt 253 lb (114.76 kg)  BMI 32.47 kg/m2  SpO2 97%  Review of Systems He denies LOC.  He has lost a few lbs    Objective:   Physical Exam VITAL SIGNS:  See vs page GENERAL: no distress Pulses: dorsalis pedis intact bilat.   Feet: no deformity. 1+ bilat leg edema. There is bilateral onychomycosis.   Skin: no ulcer on the feet. normal color and temp. Spotty hyperpigmentation is noted on the legs and feet.   Neuro: sensation is intact to touch on the feet.    Lab Results  Component Value Date   HGBA1C 10.2* 11/22/2014      Assessment & Plan:  Noncompliance with cbg recording: I'll work around this as best I can Obesity: only slightly improved. DM: severe exacerbation.  The fact that a1c is worse when insulin schedule id more physiologic raises question of compliance   Patient is advised the following: Patient Instructions  check your blood sugar twice a day.  vary the time of day when you check, between before the 3 meals, and at bedtime.  also check if you have symptoms  of your blood sugar being too high or too low.  please keep a record of the readings and bring it to your next appointment here.  You can write it on any piece of paper.  please call us sooner if your blood sugar goes below 70, or if you have a lot of readings  over 200.  For your safety, take the humalog just before you eat, and only when you eat.   Please come back for a follow-up appointment in 3 months.   Please see a weight loss surgery specialist.  you will receive a phone call, about a day and time for an appointment.        increase humalog to 40 units 3 times a day (just before each meal)

## 2014-11-22 NOTE — Patient Instructions (Addendum)
check your blood sugar twice a day.  vary the time of day when you check, between before the 3 meals, and at bedtime.  also check if you have symptoms of your blood sugar being too high or too low.  please keep a record of the readings and bring it to your next appointment here.  You can write it on any piece of paper.  please call us sooner if your blood sugar goes below 70, or if you have a lot of readings over 200.  For your safety, take the humalog just before you eat, and only when you eat.   Please come back for a follow-up appointment in 3 months.   Please see a weight loss surgery specialist.  you will receive a phone call, about a day and time for an appointment.

## 2014-12-16 ENCOUNTER — Telehealth: Payer: Self-pay | Admitting: Endocrinology

## 2014-12-16 NOTE — Telephone Encounter (Signed)
Lvom advising pt he would have to call [phone] and register him self for the seminar class.

## 2014-12-16 NOTE — Telephone Encounter (Signed)
Patient called and would like to schedule a class for gastric bypass surgery   Call back: [phone] Leave a detailed message on phone due to him working third shift     Thank you

## 2014-12-24 ENCOUNTER — Other Ambulatory Visit: Payer: Self-pay | Admitting: Family Medicine

## 2015-01-30 ENCOUNTER — Telehealth: Payer: Self-pay | Admitting: Family Medicine

## 2015-01-30 NOTE — Telephone Encounter (Signed)
Pre Visit Letter Sent °

## 2015-02-03 ENCOUNTER — Ambulatory Visit (INDEPENDENT_AMBULATORY_CARE_PROVIDER_SITE_OTHER): Payer: Commercial Managed Care - PPO | Admitting: Medical

## 2015-02-03 ENCOUNTER — Encounter: Payer: Self-pay | Admitting: Medical

## 2015-02-03 VITALS — BP 155/88 | HR 77 | Temp 98.4°F | Ht 74.0 in | Wt 253.6 lb

## 2015-02-03 DIAGNOSIS — M5410 Radiculopathy, site unspecified: Secondary | ICD-10-CM

## 2015-02-03 MED ORDER — KETOROLAC TROMETHAMINE 60 MG/2ML IM SOLN
60.0000 mg | Freq: Once | INTRAMUSCULAR | Status: AC
  Administered 2015-02-03: 60 mg via INTRAMUSCULAR

## 2015-02-03 MED ORDER — HYDROCODONE-ACETAMINOPHEN 5-325 MG PO TABS: 1.0000 | ORAL_TABLET | Freq: Four times a day (QID) | ORAL | Status: DC | PRN

## 2015-02-03 MED ORDER — DICLOFENAC SODIUM 75 MG PO TBEC: 75.0000 mg | DELAYED_RELEASE_TABLET | Freq: Two times a day (BID) | ORAL | Status: DC

## 2015-02-03 NOTE — Patient Instructions (Signed)
Back pain with left-sided radiculopathy Rest back stretching exercises, and rest.   Diclofenac rx, robaxin, and hydrocodone.  Toradol 60 mg im today.      If leg weakness, foot drop, incontinence, sever pain despite above then ED eval.  Xray declined today but if symptoms not improving will need to get and consider mri.  Follow up in 10 days or as needed.

## 2015-02-03 NOTE — Progress Notes (Signed)
Subjective:    Patient ID: Gabriel [name], male    DOB: 12/06/1950, 64 y.o.   MRN: [id number]  HPI  Pt states pain lower back yesterday. He stumbled on steps at brother house. Twisted his back. Since then pain in back. Some pain shooting pain to left leg. Warm fuzzy sensation. Some hx of back pain surgery in 1982. Lumbar fusion back then. Since then pain will come and goes. Often responding to conservative measure with hot bath. Pt in past would take robaxin and pain medication if he needed.  Pt missed work due to pain.  Pt when he moves pain level 9/10. At rest about 5-6/10.   Review of Systems  Constitutional: Negative for fever, chills and fatigue.  Respiratory: Negative for cough, shortness of breath and wheezing.   Gastrointestinal: Negative for vomiting, abdominal pain, diarrhea, constipation and abdominal distention.  Genitourinary: Negative for urgency, frequency, hematuria, flank pain, enuresis and difficulty urinating.  Musculoskeletal: Positive for back pain.  Neurological: Positive for numbness. Negative for weakness.       Mild left leg numb sensation. But exam findings sensation intact.  Hematological: Negative for adenopathy. Does not bruise/bleed easily.   Past Medical History  Diagnosis Date  . Arthritis   . Depression   . History of chronic bronchitis     dx 2011  . Seasonal allergies   . Aortic stenosis   . Hypertension   . Diabetes mellitus, type 2   . Hyperlipidemia   . History of stroke 1992    blood clot at base of brain-[facility]  . Congestive heart failure     September 2011-CHF  . History of chicken pox   . Obesity, unspecified 11/27/2012  . Tobacco abuse 11/27/2012    Down from 3 ppd to 1/2 ppd  . Allergic state 02/08/2013  . Acute low back pain with radicular symptoms, duration less than 6 weeks 05/16/2013  . Viral URI 05/26/2014  . Unspecified hereditary and idiopathic peripheral neuropathy 05/26/2014    Tingling in toes    History    Social History  . Marital Status: Widowed    Spouse Name: N/A  . Number of Children: 2  . 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 Smoking status: Current Every Day Smoker -- 1.00 packs/day  . Smokeless tobacco: Never Used     Comment: smokes a pack per day-started in 1972  . Alcohol Use: No  . Drug Use: No  . Sexual Activity: Not on file   Other Topics Concern  . Not on file   Social History Narrative   Regular exercise-no   Caffeine Use-yes          Past Surgical History  Procedure Laterality Date  . Knee surgery  1976, 1989    right knee torn ligament repair  . Lumbar fusion  1982    L4-L5  . Revision total knee arthroplasty  2007    right knee replacement    Family History  Problem Relation Age of Onset  . Arthritis      mother/father/paternal grandparents  . Heart disease Father     pacemaker  . Hypertension Father   . Hypertension Paternal Grandfather   . Hypertension Brother   . Emotional abuse Mother   . Diabetes Brother      x 2  . Diabetes Paternal Grandmother     No Known Allergies  Current Outpatient  Prescriptions on File Prior to Visit  Medication Sig Dispense Refill  . amLODipine (NORVASC) 10 MG tablet Take 1 tablet (10 mg total) by mouth daily. 90 tablet 1  . aspirin 81 MG tablet Take 81 mg by mouth daily.      . carvedilol (COREG) 25 MG tablet TAKE ONE AND ONE-HALF TABLETS TWICE A DAY 270 tablet 1  . fluticasone (FLONASE) 50 MCG/ACT nasal spray Place 2 sprays into the nose daily as needed for rhinitis or allergies. 16 g 6  . furosemide (LASIX) 40 MG tablet Take 0.5 tablets (20 mg total) by mouth daily. 45 tablet 3  . gemfibrozil (LOPID) 600 MG tablet TAKE 1 TABLET TWICE A DAY BEFORE MEALS 180 tablet 1  . Insulin Glargine (LANTUS SOLOSTAR) 100 UNIT/ML Solostar Pen Inject 110 Units into the skin daily. 15 pen 1  . insulin lispro (HUMALOG KWIKPEN) 100 UNIT/ML KiwkPen  Inject 0.3 mLs (30 Units total) into the skin 3 (three) times daily with meals. and pen needles 6/day (Patient taking differently: Inject 40 Units into the skin 3 (three) times daily with meals. and pen needles 6/day) 90 mL 11  . Insulin Pen Needle 31G X 5 MM MISC USE AS DIRECTED ONE TIME DAILY. 150 each 1  . Krill Oil CAPS MegaRed or a generic by schiff daily    . LANTUS SOLOSTAR 100 UNIT/ML Solostar Pen INJECT 140 UNITS UNDER THE SKIN DAILY AT 10:00 P.M. (APPOINTMENT NEEDED FOR FURTHER REFILLS) 3 mL 1  . lisinopril (PRINIVIL,ZESTRIL) 40 MG tablet TAKE 1 TABLET DAILY 90 tablet 1  . methocarbamol (ROBAXIN) 500 MG tablet TAKE ONE TABLET BY MOUTH TWICE DAILY AS NEEDED FOR PAIN OR MUSCLE SPASM 60 tablet 0  . Multiple Vitamin (MULTIVITAMIN) tablet Take 1 tablet by mouth daily.      . pravastatin (PRAVACHOL) 40 MG tablet Take 1 tablet (40 mg total) by mouth daily. 90 tablet 1  . sertraline (ZOLOFT) 100 MG tablet Take 1.5 tablets (150 mg total) by mouth daily. 135 tablet 1  . triamterene-hydrochlorothiazide (MAXZIDE-25) 37.5-25 MG per tablet TAKE ONE TABLET BY MOUTH ONCE DAILY 90 tablet 1  . albuterol (PROVENTIL HFA;VENTOLIN HFA) 108 (90 BASE) MCG/ACT inhaler Inhale 2 puffs into the lungs every 6 (six) hours as needed for wheezing. 1 Inhaler 0   No current facility-administered medications on file prior to visit.    BP 155/88 mmHg  Pulse 77  Temp(Src) 98.4 F (36.9 C) (Oral)  Ht 6\' 2"  (1.88 m)  Wt 253 lb 9.6 oz (115.032 kg)  BMI 32.55 kg/m2  SpO2 95%      Objective:   Physical Exam  General Appearance- Not in acute distress.    Chest and Lung Exam Auscultation: Breath sounds:-Normal. Clear even and unlabored. Adventitious sounds:- No Adventitious sounds.  Cardiovascular Auscultation:Rythm - Regular, rate and rythm. Heart Sounds -Normal heart sounds.  Abdomen Inspection:-Inspection Normal.  Palpation/Perucssion: Palpation and Percussion of the abdomen reveal- Non Tender, No  Rebound tenderness, No rigidity(Guarding) and No Palpable abdominal masses.  Liver:-Normal.  Spleen:- Normal.   Back Mid lumbar spine tenderness to palpation. Pain on straight leg lift. Pain on lateral movements and flexion/extension of the spine.  Lower ext neurologic  L5-S1 sensation intact bilaterally. Normal patellar reflexes bilaterally. No foot drop bilaterally.      Assessment & Plan:

## 2015-02-03 NOTE — Progress Notes (Signed)
Pre visit review using our clinic review tool, if applicable. No additional management support is needed unless otherwise documented below in the visit note. 

## 2015-02-03 NOTE — Assessment & Plan Note (Signed)
Rest back stretching exercises, and rest.   Diclofenac rx, robaxin, and hydrocodone.  Toradol 60 mg im today.

## 2015-02-04 ENCOUNTER — Other Ambulatory Visit: Payer: Self-pay | Admitting: Family Medicine

## 2015-02-17 ENCOUNTER — Telehealth: Payer: Self-pay | Admitting: *Deleted

## 2015-02-17 NOTE — Telephone Encounter (Signed)
Unable to reach patient at time of Pre-Visit Call.  Left message for patient to return call when available.    

## 2015-02-18 ENCOUNTER — Ambulatory Visit (HOSPITAL_BASED_OUTPATIENT_CLINIC_OR_DEPARTMENT_OTHER)
Admission: RE | Admit: 2015-02-18 | Discharge: 2015-02-18 | Disposition: A | Discharge status: Home / Self Care | Payer: Commercial Managed Care - PPO | Source: Ambulatory Visit | Attending: Family Medicine | Admitting: Family Medicine

## 2015-02-18 ENCOUNTER — Other Ambulatory Visit: Payer: Self-pay | Admitting: Family Medicine

## 2015-02-18 ENCOUNTER — Encounter: Payer: Self-pay | Admitting: Family Medicine

## 2015-02-18 ENCOUNTER — Ambulatory Visit (INDEPENDENT_AMBULATORY_CARE_PROVIDER_SITE_OTHER): Payer: Commercial Managed Care - PPO | Admitting: Family Medicine

## 2015-02-18 VITALS — BP 132/80 | HR 74 | Temp 97.9°F | Resp 18 | Ht 73.0 in | Wt 252.0 lb

## 2015-02-18 DIAGNOSIS — E1122 Type 2 diabetes mellitus with diabetic chronic kidney disease: Secondary | ICD-10-CM

## 2015-02-18 DIAGNOSIS — L989 Disorder of the skin and subcutaneous tissue, unspecified: Secondary | ICD-10-CM | POA: Diagnosis not present

## 2015-02-18 DIAGNOSIS — Z0000 Encounter for general adult medical examination without abnormal findings: Secondary | ICD-10-CM

## 2015-02-18 DIAGNOSIS — I10 Essential (primary) hypertension: Secondary | ICD-10-CM | POA: Diagnosis not present

## 2015-02-18 DIAGNOSIS — M542 Cervicalgia: Secondary | ICD-10-CM

## 2015-02-18 DIAGNOSIS — N189 Chronic kidney disease, unspecified: Secondary | ICD-10-CM | POA: Diagnosis not present

## 2015-02-18 DIAGNOSIS — M5410 Radiculopathy, site unspecified: Secondary | ICD-10-CM | POA: Insufficient documentation

## 2015-02-18 DIAGNOSIS — E669 Obesity, unspecified: Secondary | ICD-10-CM

## 2015-02-18 DIAGNOSIS — M47892 Other spondylosis, cervical region: Secondary | ICD-10-CM | POA: Diagnosis not present

## 2015-02-18 DIAGNOSIS — Z720 Tobacco use: Secondary | ICD-10-CM

## 2015-02-18 DIAGNOSIS — E785 Hyperlipidemia, unspecified: Secondary | ICD-10-CM

## 2015-02-18 LAB — LIPID PANEL
Cholesterol: 146 mg/dL (ref 0–200)
HDL: 23.9 mg/dL — ABNORMAL LOW (ref >39.00)
NONHDL: 122.1
Total CHOL/HDL Ratio: 6
Triglycerides: 257 mg/dL — ABNORMAL HIGH (ref 0.0–149.0)
VLDL: 51.4 mg/dL — ABNORMAL HIGH (ref 0.0–40.0)

## 2015-02-18 LAB — COMPREHENSIVE METABOLIC PANEL
ALT: 13 U/L (ref 0–53)
AST: 12 U/L (ref 0–37)
Albumin: 4.3 g/dL (ref 3.5–5.2)
Alkaline Phosphatase: 119 U/L — ABNORMAL HIGH (ref 39–117)
BUN: 28 mg/dL — ABNORMAL HIGH (ref 6–23)
CHLORIDE: 95 meq/L — AB (ref 96–112)
CO2: 29 meq/L (ref 19–32)
CREATININE: 1.66 mg/dL — AB (ref 0.40–1.50)
Calcium: 9.8 mg/dL (ref 8.4–10.5)
GFR: 44.83 mL/min — ABNORMAL LOW (ref >60.00)
Glucose, Bld: 394 mg/dL — ABNORMAL HIGH (ref 70–99)
Potassium: 5.2 mEq/L — ABNORMAL HIGH (ref 3.5–5.1)
Sodium: 130 mEq/L — ABNORMAL LOW (ref 135–145)
TOTAL PROTEIN: 7.5 g/dL (ref 6.0–8.3)
Total Bilirubin: 0.4 mg/dL (ref 0.2–1.2)

## 2015-02-18 LAB — CBC
HEMATOCRIT: 43.3 % (ref 39.0–52.0)
Hemoglobin: 15.1 g/dL (ref 13.0–17.0)
MCHC: 34.8 g/dL (ref 30.0–36.0)
MCV: 90.5 fl (ref 78.0–100.0)
Platelets: 206 10*3/uL (ref 150.0–400.0)
RBC: 4.79 Mil/uL (ref 4.22–5.81)
RDW: 13.4 % (ref 11.5–15.5)
WBC: 10 10*3/uL (ref 4.0–10.5)

## 2015-02-18 LAB — TSH: TSH: 0.66 u[IU]/mL (ref 0.35–4.50)

## 2015-02-18 LAB — LDL CHOLESTEROL, DIRECT: LDL DIRECT: 77 mg/dL

## 2015-02-18 MED ORDER — CARVEDILOL 25 MG PO TABS: ORAL_TABLET | ORAL | Status: DC

## 2015-02-18 MED ORDER — CYCLOBENZAPRINE HCL 10 MG PO TABS: 10.0000 mg | ORAL_TABLET | Freq: Every evening | ORAL | Status: DC | PRN

## 2015-02-18 MED ORDER — NEOMYCIN-POLYMYXIN-HC 3.5-10000-1 OT SOLN: 3.0000 [drp] | Freq: Three times a day (TID) | OTIC | Status: DC

## 2015-02-18 MED ORDER — TRIAMTERENE-HCTZ 37.5-25 MG PO TABS: 1.0000 | ORAL_TABLET | Freq: Every day | ORAL | Status: DC

## 2015-02-18 MED ORDER — GEMFIBROZIL 600 MG PO TABS: ORAL_TABLET | ORAL | Status: DC

## 2015-02-18 MED ORDER — FUROSEMIDE 40 MG PO TABS: 20.0000 mg | ORAL_TABLET | Freq: Every day | ORAL | Status: DC

## 2015-02-18 MED ORDER — LISINOPRIL 40 MG PO TABS: 40.0000 mg | ORAL_TABLET | Freq: Every day | ORAL | Status: DC

## 2015-02-18 MED ORDER — AMLODIPINE BESYLATE 10 MG PO TABS: 10.0000 mg | ORAL_TABLET | Freq: Every day | ORAL | Status: DC

## 2015-02-18 MED ORDER — SERTRALINE HCL 100 MG PO TABS: 150.0000 mg | ORAL_TABLET | Freq: Every day | ORAL | Status: DC

## 2015-02-18 MED ORDER — INSULIN GLARGINE 100 UNIT/ML SOLOSTAR PEN: PEN_INJECTOR | SUBCUTANEOUS | Status: DC

## 2015-02-18 MED ORDER — PRAVASTATIN SODIUM 40 MG PO TABS: 40.0000 mg | ORAL_TABLET | Freq: Every day | ORAL | Status: DC

## 2015-02-18 NOTE — Assessment & Plan Note (Signed)
Continues at 1/2 pack encouraged to quit once again. Encouraged complete cessation. Discussed need to quit as relates to risk of numerous cancers, cardiac and pulmonary disease as well as neurologic complications. Counseled for greater than 3 minutes
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
## 2015-02-18 NOTE — Assessment & Plan Note (Signed)
hgba1c unacceptable, minimize simple carbs. Increase exercise as tolerated. Continue current meds for now sees endocrinology later this week when hgba1c is due. Fasting numbers in 120s but has had some 60s while working. Max 148 in past month

## 2015-02-18 NOTE — Assessment & Plan Note (Signed)
Well controlled, no changes to meds. Encouraged heart healthy diet such as the DASH diet and exercise as tolerated.  °

## 2015-02-18 NOTE — Progress Notes (Signed)
Pre visit review using our clinic review tool, if applicable. No additional management support is needed unless otherwise documented below in the visit note. 

## 2015-02-18 NOTE — Patient Instructions (Signed)
Preventive Care for Adults A healthy lifestyle and preventive care can promote health and wellness. Preventive health guidelines for men include the following key practices:  A routine yearly physical is a good way to check with your health care provider about your health and preventative screening. It is a chance to share any concerns and updates on your health and to receive a thorough exam.  Visit your dentist for a routine exam and preventative care every 6 months. Brush your teeth twice a day and floss once a day. Good oral hygiene prevents tooth decay and gum disease.  The frequency of eye exams is based on your age, health, family medical history, use of contact lenses, and other factors. Follow your health care provider's recommendations for frequency of eye exams.  Eat a healthy diet. Foods such as vegetables, fruits, whole grains, low-fat dairy products, and lean protein foods contain the nutrients you need without too many calories. Decrease your intake of foods high in solid fats, added sugars, and salt. Eat the right amount of calories for you.Get information about a proper diet from your health care provider, if necessary.  Regular physical exercise is one of the most important things you can do for your health. Most adults should get at least 150 minutes of moderate-intensity exercise (any activity that increases your heart rate and causes you to sweat) each week. In addition, most adults need muscle-strengthening exercises on 2 or more days a week.  Maintain a healthy weight. The body mass index (BMI) is a screening tool to identify possible weight problems. It provides an estimate of body fat based on height and weight. Your health care provider can find your BMI and can help you achieve or maintain a healthy weight.For adults 20 years and older:  A BMI below 18.5 is considered underweight.  A BMI of 18.5 to 24.9 is normal.  A BMI of 25 to 29.9 is considered overweight.  A  BMI of 30 and above is considered obese.  Maintain normal blood lipids and cholesterol levels by exercising and minimizing your intake of saturated fat. Eat a balanced diet with plenty of fruit and vegetables. Blood tests for lipids and cholesterol should begin at age 75 and be repeated every 5 years. If your lipid or cholesterol levels are high, you are over 50, or you are at high risk for heart disease, you may need your cholesterol levels checked more frequently.Ongoing high lipid and cholesterol levels should be treated with medicines if diet and exercise are not working.  If you smoke, find out from your health care provider how to quit. If you do not use tobacco, do not start.  Lung cancer screening is recommended for adults aged 39-80 years who are at high risk for developing lung cancer because of a history of smoking. A yearly low-dose CT scan of the lungs is recommended for people who have at least a 30-pack-year history of smoking and are a current smoker or have quit within the past 15 years. A pack year of smoking is smoking an average of 1 pack of cigarettes a day for 1 year (for example: 1 pack a day for 30 years or 2 packs a day for 15 years). Yearly screening should continue until the smoker has stopped smoking for at least 15 years. Yearly screening should be stopped for people who develop a health problem that would prevent them from having lung cancer treatment.  If you choose to drink alcohol, do not have more  than 2 drinks per day. One drink is considered to be 12 ounces (355 mL) of beer, 5 ounces (148 mL) of wine, or 1.5 ounces (44 mL) of liquor.  Avoid use of street drugs. Do not share needles with anyone. Ask for help if you need support or instructions about stopping the use of drugs.  High blood pressure causes heart disease and increases the risk of stroke. Your blood pressure should be checked at least every 1-2 years. Ongoing high blood pressure should be treated with  medicines, if weight loss and exercise are not effective.  If you are 74-72 years old, ask your health care provider if you should take aspirin to prevent heart disease.  Diabetes screening involves taking a blood sample to check your fasting blood sugar level. This should be done once every 3 years, after age 46, if you are within normal weight and without risk factors for diabetes. Testing should be considered at a younger age or be carried out more frequently if you are overweight and have at least 1 risk factor for diabetes.  Colorectal cancer can be detected and often prevented. Most routine colorectal cancer screening begins at the age of 35 and continues through age 10. However, your health care provider may recommend screening at an earlier age if you have risk factors for colon cancer. On a yearly basis, your health care provider may provide home test kits to check for hidden blood in the stool. Use of a small camera at the end of a tube to directly examine the colon (sigmoidoscopy or colonoscopy) can detect the earliest forms of colorectal cancer. Talk to your health care provider about this at age 87, when routine screening begins. Direct exam of the colon should be repeated every 5-10 years through age 29, unless early forms of precancerous polyps or small growths are found.  People who are at an increased risk for hepatitis B should be screened for this virus. You are considered at high risk for hepatitis B if:  You were born in a country where hepatitis B occurs often. Talk with your health care provider about which countries are considered high risk.  Your parents were born in a high-risk country and you have not received a shot to protect against hepatitis B (hepatitis B vaccine).  You have HIV or AIDS.  You use needles to inject street drugs.  You live with, or have sex with, someone who has hepatitis B.  You are a man who has sex with other men (MSM).  You get hemodialysis  treatment.  You take certain medicines for conditions such as cancer, organ transplantation, and autoimmune conditions.  Hepatitis C blood testing is recommended for all people born from [birth year] through 1965 and any individual with known risks for hepatitis C.  Practice safe sex. Use condoms and avoid high-risk sexual practices to reduce the spread of sexually transmitted infections (STIs). STIs include gonorrhea, chlamydia, syphilis, trichomonas, herpes, HPV, and human immunodeficiency virus (HIV). Herpes, HIV, and HPV are viral illnesses that have no cure. They can result in disability, cancer, and death.  If you are at risk of being infected with HIV, it is recommended that you take a prescription medicine daily to prevent HIV infection. This is called preexposure prophylaxis (PrEP). You are considered at risk if:  You are a man who has sex with other men (MSM) and have other risk factors.  You are a heterosexual man, are sexually active, and are at increased risk for HIV  infection.  You take drugs by injection.  You are sexually active with a partner who has HIV.  Talk with your health care provider about whether you are at high risk of being infected with HIV. If you choose to begin PrEP, you should first be tested for HIV. You should then be tested every 3 months for as long as you are taking PrEP.  A one-time screening for abdominal aortic aneurysm (AAA) and surgical repair of large AAAs by ultrasound are recommended for men ages 52 to 64 years who are current or former smokers.  Healthy men should no longer receive prostate-specific antigen (PSA) blood tests as part of routine cancer screening. Talk with your health care provider about prostate cancer screening.  Testicular cancer screening is not recommended for adult males who have no symptoms. Screening includes self-exam, a health care provider exam, and other screening tests. Consult with your health care provider about any symptoms  you have or any concerns you have about testicular cancer.  Use sunscreen. Apply sunscreen liberally and repeatedly throughout the day. You should seek shade when your shadow is shorter than you. Protect yourself by wearing long sleeves, pants, a wide-brimmed hat, and sunglasses year round, whenever you are outdoors.  Once a month, do a whole-body skin exam, using a mirror to look at the skin on your back. Tell your health care provider about new moles, moles that have irregular borders, moles that are larger than a pencil eraser, or moles that have changed in shape or color.  Stay current with required vaccines (immunizations).  Influenza vaccine. All adults should be immunized every year.  Tetanus, diphtheria, and acellular pertussis (Td, Tdap) vaccine. An adult who has not previously received Tdap or who does not know his vaccine status should receive 1 dose of Tdap. This initial dose should be followed by tetanus and diphtheria toxoids (Td) booster doses every 10 years. Adults with an unknown or incomplete history of completing a 3-dose immunization series with Td-containing vaccines should begin or complete a primary immunization series including a Tdap dose. Adults should receive a Td booster every 10 years.  Varicella vaccine. An adult without evidence of immunity to varicella should receive 2 doses or a second dose if he has previously received 1 dose.  Human papillomavirus (HPV) vaccine. Males aged 31-21 years who have not received the vaccine previously should receive the 3-dose series. Males aged 22-26 years may be immunized. Immunization is recommended through the age of 54 years for any male who has sex with males and did not get any or all doses earlier. Immunization is recommended for any person with an immunocompromised condition through the age of 50 years if he did not get any or all doses earlier. During the 3-dose series, the second dose should be obtained 4-8 weeks after the first  dose. The third dose should be obtained 24 weeks after the first dose and 16 weeks after the second dose.  Zoster vaccine. One dose is recommended for adults aged 61 years or older unless certain conditions are present.  Measles, mumps, and rubella (MMR) vaccine. Adults born before [birth year] generally are considered immune to measles and mumps. Adults born in [birth year] or later should have 1 or more doses of MMR vaccine unless there is a contraindication to the vaccine or there is laboratory evidence of immunity to each of the three diseases. A routine second dose of MMR vaccine should be obtained at least 28 days after the first dose for students  attending postsecondary schools, health care workers, or international travelers. People who received inactivated measles vaccine or an unknown type of measles vaccine during 1963-1967 should receive 2 doses of MMR vaccine. People who received inactivated mumps vaccine or an unknown type of mumps vaccine before 1979 and are at high risk for mumps infection should consider immunization with 2 doses of MMR vaccine. Unvaccinated health care workers born before [birth year] who lack laboratory evidence of measles, mumps, or rubella immunity or laboratory confirmation of disease should consider measles and mumps immunization with 2 doses of MMR vaccine or rubella immunization with 1 dose of MMR vaccine.  Pneumococcal 13-valent conjugate (PCV13) vaccine. When indicated, a person who is uncertain of his immunization history and has no record of immunization should receive the PCV13 vaccine. An adult aged 20 years or older who has certain medical conditions and has not been previously immunized should receive 1 dose of PCV13 vaccine. This PCV13 should be followed with a dose of pneumococcal polysaccharide (PPSV23) vaccine. The PPSV23 vaccine dose should be obtained at least 8 weeks after the dose of PCV13 vaccine. An adult aged 40 years or older who has certain medical conditions and  previously received 1 or more doses of PPSV23 vaccine should receive 1 dose of PCV13. The PCV13 vaccine dose should be obtained 1 or more years after the last PPSV23 vaccine dose.  Pneumococcal polysaccharide (PPSV23) vaccine. When PCV13 is also indicated, PCV13 should be obtained first. All adults aged 93 years and older should be immunized. An adult younger than age 80 years who has certain medical conditions should be immunized. Any person who resides in a nursing home or long-term care facility should be immunized. An adult smoker should be immunized. People with an immunocompromised condition and certain other conditions should receive both PCV13 and PPSV23 vaccines. People with human immunodeficiency virus (HIV) infection should be immunized as soon as possible after diagnosis. Immunization during chemotherapy or radiation therapy should be avoided. Routine use of PPSV23 vaccine is not recommended for American Indians, [place] Natives, or people younger than 65 years unless there are medical conditions that require PPSV23 vaccine. When indicated, people who have unknown immunization and have no record of immunization should receive PPSV23 vaccine. One-time revaccination 5 years after the first dose of PPSV23 is recommended for people aged 19-64 years who have chronic kidney failure, nephrotic syndrome, asplenia, or immunocompromised conditions. People who received 1-2 doses of PPSV23 before age 49 years should receive another dose of PPSV23 vaccine at age 25 years or later if at least 5 years have passed since the previous dose. Doses of PPSV23 are not needed for people immunized with PPSV23 at or after age 37 years.  Meningococcal vaccine. Adults with asplenia or persistent complement component deficiencies should receive 2 doses of quadrivalent meningococcal conjugate (MenACWY-D) vaccine. The doses should be obtained at least 2 months apart. Microbiologists working with certain meningococcal bacteria,  [employer] recruits, people at risk during an outbreak, and people who travel to or live in countries with a high rate of meningitis should be immunized. A first-year college student up through age 58 years who is living in a residence hall should receive a dose if he did not receive a dose on or after his 16th birthday. Adults who have certain high-risk conditions should receive one or more doses of vaccine.  Hepatitis A vaccine. Adults who wish to be protected from this disease, have certain high-risk conditions, work with hepatitis A-infected animals, work in hepatitis A research  labs, or travel to or work in countries with a high rate of hepatitis A should be immunized. Adults who were previously unvaccinated and who anticipate close contact with an international adoptee during the first 60 days after arrival in the [place] from a country with a high rate of hepatitis A should be immunized.  Hepatitis B vaccine. Adults should be immunized if they wish to be protected from this disease, have certain high-risk conditions, may be exposed to blood or other infectious body fluids, are household contacts or sex partners of hepatitis B positive people, are clients or workers in certain care facilities, or travel to or work in countries with a high rate of hepatitis B.  Haemophilus influenzae type b (Hib) vaccine. A previously unvaccinated person with asplenia or sickle cell disease or having a scheduled splenectomy should receive 1 dose of Hib vaccine. Regardless of previous immunization, a recipient of a hematopoietic stem cell transplant should receive a 3-dose series 6-12 months after his successful transplant. Hib vaccine is not recommended for adults with HIV infection. Preventive Service / Frequency Ages 45 to 99  Blood pressure check.** / Every 1 to 2 years.  Lipid and cholesterol check.** / Every 5 years beginning at age 71.  Hepatitis C blood test.** / For any individual with known risks for  hepatitis C.  Skin self-exam. / Monthly.  Influenza vaccine. / Every year.  Tetanus, diphtheria, and acellular pertussis (Tdap, Td) vaccine.** / Consult your health care provider. 1 dose of Td every 10 years.  Varicella vaccine.** / Consult your health care provider.  HPV vaccine. / 3 doses over 6 months, if 92 or younger.  Measles, mumps, rubella (MMR) vaccine.** / You need at least 1 dose of MMR if you were born in 1957 or later. You may also need a second dose.  Pneumococcal 13-valent conjugate (PCV13) vaccine.** / Consult your health care provider.  Pneumococcal polysaccharide (PPSV23) vaccine.** / 1 to 2 doses if you smoke cigarettes or if you have certain conditions.  Meningococcal vaccine.** / 1 dose if you are age 55 to 46 years and a Market researcher living in a residence hall, or have one of several medical conditions. You may also need additional booster doses.  Hepatitis A vaccine.** / Consult your health care provider.  Hepatitis B vaccine.** / Consult your health care provider.  Haemophilus influenzae type b (Hib) vaccine.** / Consult your health care provider. Ages 20 to 31  Blood pressure check.** / Every 1 to 2 years.  Lipid and cholesterol check.** / Every 5 years beginning at age 64.  Lung cancer screening. / Every year if you are aged 73-80 years and have a 30-pack-year history of smoking and currently smoke or have quit within the past 15 years. Yearly screening is stopped once you have quit smoking for at least 15 years or develop a health problem that would prevent you from having lung cancer treatment.  Fecal occult blood test (FOBT) of stool. / Every year beginning at age 39 and continuing until age 60. You may not have to do this test if you get a colonoscopy every 10 years.  Flexible sigmoidoscopy** or colonoscopy.** / Every 5 years for a flexible sigmoidoscopy or every 10 years for a colonoscopy beginning at age 50 and continuing until age  36.  Hepatitis C blood test.** / For all people born from [birth year] through 1963 and any individual with known risks for hepatitis C.  Skin self-exam. / Monthly.  Influenza vaccine. /  Every year.  Tetanus, diphtheria, and acellular pertussis (Tdap/Td) vaccine.** / Consult your health care provider. 1 dose of Td every 10 years.  Varicella vaccine.** / Consult your health care provider.  Zoster vaccine.** / 1 dose for adults aged 34 years or older.  Measles, mumps, rubella (MMR) vaccine.** / You need at least 1 dose of MMR if you were born in 1955 or later. You may also need a second dose.  Pneumococcal 13-valent conjugate (PCV13) vaccine.** / Consult your health care provider.  Pneumococcal polysaccharide (PPSV23) vaccine.** / 1 to 2 doses if you smoke cigarettes or if you have certain conditions.  Meningococcal vaccine.** / Consult your health care provider.  Hepatitis A vaccine.** / Consult your health care provider.  Hepatitis B vaccine.** / Consult your health care provider.  Haemophilus influenzae type b (Hib) vaccine.** / Consult your health care provider. Ages 33 and over  Blood pressure check.** / Every 1 to 2 years.  Lipid and cholesterol check.**/ Every 5 years beginning at age 12.  Lung cancer screening. / Every year if you are aged 77-80 years and have a 30-pack-year history of smoking and currently smoke or have quit within the past 15 years. Yearly screening is stopped once you have quit smoking for at least 15 years or develop a health problem that would prevent you from having lung cancer treatment.  Fecal occult blood test (FOBT) of stool. / Every year beginning at age 21 and continuing until age 40. You may not have to do this test if you get a colonoscopy every 10 years.  Flexible sigmoidoscopy** or colonoscopy.** / Every 5 years for a flexible sigmoidoscopy or every 10 years for a colonoscopy beginning at age 74 and continuing until age 76.  Hepatitis C blood  test.** / For all people born from [birth year] through 1963 and any individual with known risks for hepatitis C.  Abdominal aortic aneurysm (AAA) screening.** / A one-time screening for ages 66 to 78 years who are current or former smokers.  Skin self-exam. / Monthly.  Influenza vaccine. / Every year.  Tetanus, diphtheria, and acellular pertussis (Tdap/Td) vaccine.** / 1 dose of Td every 10 years.  Varicella vaccine.** / Consult your health care provider.  Zoster vaccine.** / 1 dose for adults aged 63 years or older.  Pneumococcal 13-valent conjugate (PCV13) vaccine.** / Consult your health care provider.  Pneumococcal polysaccharide (PPSV23) vaccine.** / 1 dose for all adults aged 16 years and older.  Meningococcal vaccine.** / Consult your health care provider.  Hepatitis A vaccine.** / Consult your health care provider.  Hepatitis B vaccine.** / Consult your health care provider.  Haemophilus influenzae type b (Hib) vaccine.** / Consult your health care provider. **Family history and personal history of risk and conditions may change your health care provider's recommendations. Document Released: 11/30/2001 Document Revised: 10/09/2013 Document Reviewed: 03/01/2011 [facility] Patient Information 2015 [place], [place]. This information is not intended to replace advice given to you by your health care provider. Make sure you discuss any questions you have with your health care provider. Bariatric Surgery Information Severe obesity is difficult to treat through diet and exercise alone. Bariatric surgery (also called weight loss surgery) is an option for people who are severely obese and cannot lose weight by traditional means, or who suffer from serious obesity-related health problems. The surgery promotes weight loss by decreasing the absorption of food and, in some cases, by interrupting the digestive process. As in other treatments for obesity, best results are achieved with  healthy eating  behaviors and regular physical activity.  People who may consider bariatric surgery include those with a body mass index (BMI) above 40. Men with a BMI of 40 are about 100 lb (45 kg) overweight and women with this BMI are about 80 lb (36 kg) overweight. People with a BMI between 35 and 40 and who suffer from type 2 diabetes or life-threatening heart and lung (cardiopulmonary) problems, such as severe sleep apnea or obesity-related heart disease, may also be candidates for surgery.  THE NORMAL DIGESTIVE PROCESS Normally, as food moves along the digestive tract, digestive juices and enzymes digest and absorb calories and nutrients. After we chew and swallow our food, it moves down the esophagus to the stomach. There a strong acid continues the digestive process. When the stomach contents move to the first portion of the small intestine (duodenum), bile and pancreatic juice speed up digestion. The jejunum and ileum are the remaining two segments of the small intestine. They complete the absorption of almost all calories and nutrients. The food particles that cannot be digested in the small intestine are stored in the large intestine until they are eliminated.  HOW DOES SURGERY PROMOTE WEIGHT LOSS? Bariatric surgery alters the digestive process. The surgery closes off parts of the stomach to make it smaller, restricting the amount of food the stomach can hold. There are two types of bariatric surgeries: restrictive surgeries and malabsorptive surgeries. Restrictive surgeries only reduce stomach size. They do not interfere with the normal digestive process. Malabsorptive surgeries combine stomach restriction with a partial bypass of the small intestine. These types of procedures create a direct connection from the stomach to the lower segment of the small intestine. The connection causes food to bypass the portions of the digestive tract that absorb calories and nutrients.Malabsorptive surgeries are the most common  surgeries for weight loss. They restrict both food intake and the amount of calories and nutrients the body absorbs.  Restrictive surgeries lead to weight loss in almost all patients. But they are less successful than malabsorptive surgeries in achieving substantial, long-term weight loss. Some patients regain weight. Others are unable to adjust their eating habits and fail to lose the desired weight. Successful results depend on the patient's willingness to adopt a long-term plan of healthy eating and regular physical activity.  RESTRICTIVE SURGERY To perform a restrictive surgery, health care providers create a small pouch at the top of the stomach where food enters from the esophagus. At first, the pouch holds about 1 oz (28 g) of food. It later expands to hold 2-3 oz (56-84 g). The lower outlet of the pouch usually has a diameter of about  inch (1.9 cm). This small outlet delays the emptying of food from the pouch and causes a feeling of fullness. As a result of this surgery, most people lose the ability to eat large amounts of food at one time. After the surgery, the person usually can eat only  to 1 c (about 2 L) of food without discomfort or nausea. Also, food has to be well chewed.  There are several types of procedures that create this pouch. Adjustable Gastric Banding In this procedure, a hollow band is placed around the stomach near its upper end. This creates a small pouch and a narrow passage into the larger remainder of the stomach. The band is then inflated with a salt solution. It can be tightened or loosened over time to change the size of the passage by increasing or decreasing the  amount of salt solution.  The band is adjusted based on feelings of hunger and weight loss. Patients decide when they need an adjustment and come to their surgeons to be evaluated. The adjustment is done as an office visit. The band is fully reversible with a second surgery if the patient changes his or her  mind. There is no cutting or rerouting of the intestine.  Vertical Banded Gastroplasty This is the most common restrictive surgery for weight control. Both a band and staples are used to create a small stomach pouch. Vertical banded gastroplasty is based on the same principle of restriction as adjustable gastric banding, but the stomach is surgically altered with the stapling. This treatment is not reversible.  About 30% of those who undergo vertical banded gastroplasty achieve normal weight. About 80% achieve some degree of weight loss. MALABSORPTIVE SURGERY Malabsorptive surgeries produce more weight loss than restrictive surgeries. And they are more effective in reversing the health problems associated with severe obesity. Patients who have malabsorptive surgeries generally lose two-thirds of their excess weight within 2 years. There are several types of malabsorptive surgeries. Each one carries its own benefits and risks.  Roux-en-Y Gastric Bypass (RGB) This surgery is the most common and successful malabsorptive surgery. First, a small stomach pouch is created to restrict food intake. Next, a y-shaped section of the small intestine is attached to the pouch. This allows food to bypass the lower stomach, the duodenum, and the first portion of jejunum. This reduces the amount of calories and nutrients the body absorbs.  Biliopancreatic diversion (BPD) In this more complicated malabsorptive surgery, portions of the stomach are removed. The small pouch that remains is connected directly to the final segment of the small intestine, completely bypassing the duodenum and the jejunum.  This procedure successfully promotes weight loss. But it is less frequently used than other types of surgery because of the high risk for nutritional deficiencies. A variation of this procedure includes a "duodenal switch." This leaves a larger portion of the stomach intact, including the valve that regulates the release of  stomach contents into the small intestine (pyloric valve). It also keeps a small part of the duodenum in the digestive pathway.  WHAT ARE THE BENEFITS AND RISKS OF BARIATRIC SURGERY? General Benefits  Right after surgery, most patients lose weight quickly. They continue to lose weight for 18-24 months after the procedure. Most patients regain 5-10% of the weight they lost, but many maintain a long-term weight loss of about 100 lb (45 kg).   Most obesity-related conditions, such as abnormal blood sugar levels, improve after the surgery.  General Risks  Infection.  Abdominal hernias.   Breakdown of the staple line.   Stretched stomach outlets.  Development of gallstones. These are clumps of cholesterol and other matter that form in the gallbladder. During quick or substantial weight loss, one's risk of developing gallstones increases.   Nutritional deficiencies. Nearly 30% of patients who have bariatric surgery develop nutritional deficiencies. These include anemia, osteoporosis, and metabolic bone disease.  A leak from any of the surgical connections (anastomoses). This is life-threatening. The more involved the surgery, the more risk involved.   Inability to lose weight or weight gain. Patients who do not follow a strict diet will stretch out their stomach pouches and they will not lose weight.   Dumping syndrome. This occurs when stomach contents move too rapidly through the small intestine causing cramping, diarrhea, nausea, palpitations, sweating, bloating, and dizziness or fainting.  Specific Risks  of Restrictive Surgeries  Vomiting. This occurs when the small stomach is overly stretched by food particles that have not been chewed well.   Band slippage and saline leakage. This may occur after adjustable gastric banding.   Band erosion into the lumen of the stomach.   Wearing away of the band and breakdown of the staple line. This may occur after vertical banded  gastroplasty. In a small number of cases, stomach juices may leak into the abdomen. If this happens, an emergency surgery is needed.   Death from complications (rare). This happens in only about 1% of cases.   Stomach prolapse. Specific Risks of Malabsorptive Surgeries In addition to the risks of restrictive surgeries, malabsorptive surgeries also carry greater risk for nutritional deficiencies. This is because the procedure causes food to bypass the duodenum and jejunum. That is where most iron and calcium are absorbed. The more extensive the bypass, the greater the risk is for complications and nutritional deficiencies, including:    Anemia.  Osteoporosis.   Metabolic bone disease. Follow-up surgeries to correct complications are needed in about 10-20% of patients.  FOR MORE INFORMATION American Society for Metabolic & Bariatric Surgery: www.asmbs.org  Weight-control Information Network (WIN): win.AmenCredit.is Document Released: 10/04/2005 Document Revised: 10/09/2013 Document Reviewed: 04/03/2013 [facility] Patient Information 2015 [place], [place]. This information is not intended to replace advice given to you by your health care provider. Make sure you discuss any questions you have with your health care provider. Smoking Cessation Quitting smoking is important to your health and has many advantages. However, it is not always easy to quit since nicotine is a very addictive drug. Oftentimes, people try 3 times or more before being able to quit. This document explains the best ways for you to prepare to quit smoking. Quitting takes hard work and a lot of effort, but you can do it. ADVANTAGES OF QUITTING SMOKING  You will live longer, feel better, and live better.  Your body will feel the impact of quitting smoking almost immediately.  Within 20 minutes, blood pressure decreases. Your pulse returns to its normal level.  After 8 hours, carbon monoxide levels in the blood return to  normal. Your oxygen level increases.  After 24 hours, the chance of having a heart attack starts to decrease. Your breath, hair, and body stop smelling like smoke.  After 48 hours, damaged nerve endings begin to recover. Your sense of taste and smell improve.  After 72 hours, the body is virtually free of nicotine. Your bronchial tubes relax and breathing becomes easier.  After 2 to 12 weeks, lungs can hold more air. Exercise becomes easier and circulation improves.  The risk of having a heart attack, stroke, cancer, or lung disease is greatly reduced.  After 1 year, the risk of coronary heart disease is cut in half.  After 5 years, the risk of stroke falls to the same as a nonsmoker.  After 10 years, the risk of lung cancer is cut in half and the risk of other cancers decreases significantly.  After 15 years, the risk of coronary heart disease drops, usually to the level of a nonsmoker.  If you are pregnant, quitting smoking will improve your chances of having a healthy baby.  The people you live with, especially any children, will be healthier.  You will have extra money to spend on things other than cigarettes. QUESTIONS TO THINK ABOUT BEFORE ATTEMPTING TO QUIT You may want to talk about your answers with your health care provider.  Why do you want to quit?  If you tried to quit in the past, what helped and what did not?  What will be the most difficult situations for you after you quit? How will you plan to handle them?  Who can help you through the tough times? Your family? Friends? A health care provider?  What pleasures do you get from smoking? What ways can you still get pleasure if you quit? Here are some questions to ask your health care provider:  How can you help me to be successful at quitting?  What medicine do you think would be best for me and how should I take it?  What should I do if I need more help?  What is smoking withdrawal like? How can I get  information on withdrawal? GET READY  Set a quit date.  Change your environment by getting rid of all cigarettes, ashtrays, matches, and lighters in your home, car, or work. Do not let people smoke in your home.  Review your past attempts to quit. Think about what worked and what did not. GET SUPPORT AND ENCOURAGEMENT You have a better chance of being successful if you have help. You can get support in many ways.  Tell your family, friends, and coworkers that you are going to quit and need their support. Ask them not to smoke around you.  Get individual, group, or telephone counseling and support. Programs are available at [employer] and health centers. Call your local health department for information about programs in your area.  Spiritual beliefs and practices may help some smokers quit.  Download a "quit meter" on your computer to keep track of quit statistics, such as how long you have gone without smoking, cigarettes not smoked, and money saved.  Get a self-help book about quitting smoking and staying off tobacco. [place] yourself from urges to smoke. Talk to someone, go for a walk, or occupy your time with a task.  Change your normal routine. Take a different route to work. Drink tea instead of coffee. Eat breakfast in a different place.  Reduce your stress. Take a hot bath, exercise, or read a book.  Plan something enjoyable to do every day. Reward yourself for not smoking.  Explore interactive web-based programs that specialize in helping you quit. GET MEDICINE AND USE IT CORRECTLY Medicines can help you stop smoking and decrease the urge to smoke. Combining medicine with the above behavioral methods and support can greatly increase your chances of successfully quitting smoking.  Nicotine replacement therapy helps deliver nicotine to your body without the negative effects and risks of smoking. Nicotine replacement therapy includes  nicotine gum, lozenges, inhalers, nasal sprays, and skin patches. Some may be available over-the-counter and others require a prescription.  Antidepressant medicine helps people abstain from smoking, but how this works is unknown. This medicine is available by prescription.  Nicotinic receptor partial agonist medicine simulates the effect of nicotine in your brain. This medicine is available by prescription. Ask your health care provider for advice about which medicines to use and how to use them based on your health history. Your health care provider will tell you what side effects to look out for if you choose to be on a medicine or therapy. Carefully read the information on the package. Do not use any other product containing nicotine while using a nicotine replacement product.  RELAPSE OR DIFFICULT SITUATIONS Most relapses occur within the first 3 months after quitting.  Do not be discouraged if you start smoking again. Remember, most people try several times before finally quitting. You may have symptoms of withdrawal because your body is used to nicotine. You may crave cigarettes, be irritable, feel very hungry, cough often, get headaches, or have difficulty concentrating. The withdrawal symptoms are only temporary. They are strongest when you first quit, but they will go away within 10-14 days. To reduce the chances of relapse, try to:  Avoid drinking alcohol. Drinking lowers your chances of successfully quitting.  Reduce the amount of caffeine you consume. Once you quit smoking, the amount of caffeine in your body increases and can give you symptoms, such as a rapid heartbeat, sweating, and anxiety.  Avoid smokers because they can make you want to smoke.  Do not let weight gain distract you. Many smokers will gain weight when they quit, usually less than 10 pounds. Eat a healthy diet and stay active. You can always lose the weight gained after you quit.  Find ways to improve your mood other  than smoking. FOR MORE INFORMATION  www.smokefree.gov  Document Released: 09/28/2001 Document Revised: 02/18/2014 Document Reviewed: 01/13/2012 [facility] Patient Information 2015 [place], [place]. This information is not intended to replace advice given to you by your health care provider. Make sure you discuss any questions you have with your health care provider. Nicotine Addiction Nicotine can act as both a stimulant (excites/activates) and a sedative (calms/quiets). Immediately after exposure to nicotine, there is a "kick" caused in part by the drug's stimulation of the adrenal glands and resulting discharge of adrenaline (epinephrine). The rush of adrenaline stimulates the body and causes a sudden release of sugar. This means that smokers are always slightly hyperglycemic. Hyperglycemic means that the blood sugar is high, just like in diabetics. Nicotine also decreases the amount of insulin which helps control sugar levels in the body. There is an increase in blood pressure, breathing, and the rate of heart beats.  In addition, nicotine indirectly causes a release of dopamine in the brain that controls pleasure and motivation. A similar reaction is seen with other drugs of abuse, such as cocaine and heroin. This dopamine release is thought to cause the pleasurable sensations when smoking. In some different cases, nicotine can also create a calming effect, depending on sensitivity of the smoker's nervous system and the dose of nicotine taken. WHAT HAPPENS WHEN NICOTINE IS TAKEN FOR LONG PERIODS OF TIME?  Long-term use of nicotine results in addiction. It is difficult to stop.  Repeated use of nicotine creates tolerance. Higher doses of nicotine are needed to get the "kick." When nicotine use is stopped, withdrawal may last a month or more. Withdrawal may begin within a few hours after the last cigarette. Symptoms peak within the first few days and may lessen within a few weeks. For some people,  however, symptoms may last for months or longer. Withdrawal symptoms include:   Irritability.  Craving.  Learning and attention deficits.  Sleep disturbances.  Increased appetite. Craving for tobacco may last for 6 months or longer. Many behaviors done while using nicotine can also play a part in the severity of withdrawal symptoms. For some people, the feel, smell, and sight of a cigarette and the ritual of obtaining, handling, lighting, and smoking the cigarette are closely linked with the pleasure of smoking. When stopped, they also miss the related behaviors which make the withdrawal or craving worse. While nicotine gum and patches may lessen the drug aspects of withdrawal, cravings often persist.  WHAT ARE THE MEDICAL CONSEQUENCES OF NICOTINE USE?  Nicotine addiction accounts for one-third of all cancers. The top cancer caused by tobacco is lung cancer. Lung cancer is the number one cancer killer of both men and women.  Smoking is also associated with cancers of the:  Mouth.  Pharynx.  Larynx.  Esophagus.  Stomach.  Pancreas.  Cervix.  Kidney.  Ureter.  Bladder.  Smoking also causes lung diseases such as lasting (chronic) bronchitis and emphysema.  It worsens asthma in adults and children.  Smoking increases the risk of heart disease, including:  Stroke.  Heart attack.  Vascular disease.  Aneurysm.  Passive or secondary smoke can also increase medical risks including:  Asthma in children.  Sudden Infant Death Syndrome (SIDS).  Additionally, dropped cigarettes are the leading cause of residential fire fatalities.  Nicotine poisoning has been reported from accidental ingestion of tobacco products by children and pets. Death usually results in a few minutes from respiratory failure (when a person stops breathing) caused by paralysis. TREATMENT   Medication. Nicotine replacement medicines such as nicotine gum and the patch are used to stop smoking. These  medicines gradually lower the dosage of nicotine in the body. These medicines do not contain the carbon monoxide and other toxins found in tobacco smoke.  Hypnotherapy.  Relaxation therapy.  Nicotine Anonymous (a 12-step support program). Find times and locations in your local yellow pages. Document Released: 06/09/2004 Document Revised: 12/27/2011 Document Reviewed: 11/30/2013 [facility] Patient Information 2015 [place], [place]. This information is not intended to replace advice given to you by your health care provider. Make sure you discuss any questions you have with your health care provider.

## 2015-02-20 ENCOUNTER — Ambulatory Visit: Payer: Self-pay | Admitting: Endocrinology

## 2015-02-21 ENCOUNTER — Telehealth: Payer: Self-pay | Admitting: *Deleted

## 2015-02-21 ENCOUNTER — Other Ambulatory Visit: Payer: Self-pay | Admitting: Family Medicine

## 2015-02-21 DIAGNOSIS — E785 Hyperlipidemia, unspecified: Secondary | ICD-10-CM

## 2015-02-21 DIAGNOSIS — E119 Type 2 diabetes mellitus without complications: Secondary | ICD-10-CM

## 2015-02-21 MED ORDER — NEOMYCIN-POLYMYXIN-HC 3.5-10000-1 OT SOLN: 3.0000 [drp] | Freq: Three times a day (TID) | OTIC | Status: DC

## 2015-02-21 MED ORDER — LISINOPRIL 20 MG PO TABS: 20.0000 mg | ORAL_TABLET | Freq: Two times a day (BID) | ORAL | Status: DC

## 2015-02-21 MED ORDER — ATORVASTATIN CALCIUM 40 MG PO TABS: 40.0000 mg | ORAL_TABLET | Freq: Every day | ORAL | Status: DC

## 2015-02-21 NOTE — Telephone Encounter (Signed)
Prior authorization for neomycin-polymyxin-HC otic solution completed and [facility] states PA is not required. JG//CMA

## 2015-02-21 NOTE — Telephone Encounter (Signed)
D/C Lisinopril 40 mg and Pravastatin per PCP instructions per lab results. Sent in to pharmacy Lisinopril 20 mg bid and Atorvastatin 40 mg qd. Also put order in for CMP due 02/24/15 and second order for cmp due in one month March 27, 2015. Resent ear drops to local pharmacy as had gone to mail order.

## 2015-02-24 ENCOUNTER — Other Ambulatory Visit (INDEPENDENT_AMBULATORY_CARE_PROVIDER_SITE_OTHER): Payer: Commercial Managed Care - PPO

## 2015-02-24 ENCOUNTER — Encounter: Payer: Self-pay | Admitting: Family Medicine

## 2015-02-24 ENCOUNTER — Ambulatory Visit: Payer: Self-pay | Admitting: Endocrinology

## 2015-02-24 ENCOUNTER — Other Ambulatory Visit: Payer: Self-pay | Admitting: Family Medicine

## 2015-02-24 ENCOUNTER — Telehealth: Payer: Self-pay | Admitting: Family Medicine

## 2015-02-24 DIAGNOSIS — I10 Essential (primary) hypertension: Secondary | ICD-10-CM

## 2015-02-24 DIAGNOSIS — N189 Chronic kidney disease, unspecified: Secondary | ICD-10-CM | POA: Diagnosis not present

## 2015-02-24 DIAGNOSIS — Z720 Tobacco use: Secondary | ICD-10-CM | POA: Diagnosis not present

## 2015-02-24 DIAGNOSIS — M542 Cervicalgia: Secondary | ICD-10-CM

## 2015-02-24 DIAGNOSIS — E1122 Type 2 diabetes mellitus with diabetic chronic kidney disease: Secondary | ICD-10-CM

## 2015-02-24 DIAGNOSIS — E785 Hyperlipidemia, unspecified: Secondary | ICD-10-CM | POA: Diagnosis not present

## 2015-02-24 DIAGNOSIS — E669 Obesity, unspecified: Secondary | ICD-10-CM | POA: Diagnosis not present

## 2015-02-24 DIAGNOSIS — E118 Type 2 diabetes mellitus with unspecified complications: Secondary | ICD-10-CM | POA: Diagnosis not present

## 2015-02-24 DIAGNOSIS — E119 Type 2 diabetes mellitus without complications: Secondary | ICD-10-CM | POA: Diagnosis not present

## 2015-02-24 LAB — RENAL FUNCTION PANEL
Albumin: 4.2 g/dL (ref 3.5–5.2)
BUN: 29 mg/dL — ABNORMAL HIGH (ref 6–23)
CALCIUM: 9.7 mg/dL (ref 8.4–10.5)
CO2: 31 mEq/L (ref 19–32)
CREATININE: 1.5 mg/dL (ref 0.40–1.50)
Chloride: 93 mEq/L — ABNORMAL LOW (ref 96–112)
GFR: 50.39 mL/min — ABNORMAL LOW (ref >60.00)
Glucose, Bld: 450 mg/dL — ABNORMAL HIGH (ref 70–99)
Phosphorus: 3.4 mg/dL (ref 2.3–4.6)
Potassium: 5.2 mEq/L — ABNORMAL HIGH (ref 3.5–5.1)
SODIUM: 128 meq/L — AB (ref 135–145)

## 2015-02-24 LAB — COMPREHENSIVE METABOLIC PANEL
ALBUMIN: 4.2 g/dL (ref 3.5–5.2)
ALK PHOS: 97 U/L (ref 39–117)
ALT: 13 U/L (ref 0–53)
AST: 11 U/L (ref 0–37)
BILIRUBIN TOTAL: 0.3 mg/dL (ref 0.2–1.2)
BUN: 29 mg/dL — ABNORMAL HIGH (ref 6–23)
CALCIUM: 9.7 mg/dL (ref 8.4–10.5)
CO2: 31 meq/L (ref 19–32)
CREATININE: 1.5 mg/dL (ref 0.40–1.50)
Chloride: 93 mEq/L — ABNORMAL LOW (ref 96–112)
GFR: 50.39 mL/min — ABNORMAL LOW (ref >60.00)
GLUCOSE: 450 mg/dL — AB (ref 70–99)
Potassium: 5.2 mEq/L — ABNORMAL HIGH (ref 3.5–5.1)
SODIUM: 128 meq/L — AB (ref 135–145)
Total Protein: 7.1 g/dL (ref 6.0–8.3)

## 2015-02-24 LAB — MICROALBUMIN / CREATININE URINE RATIO
CREATININE, U: 48.4 mg/dL
Microalb Creat Ratio: 19.4 mg/g (ref 0.0–30.0)
Microalb, Ur: 9.4 mg/dL — ABNORMAL HIGH (ref 0.0–1.9)

## 2015-02-24 LAB — HEMOGLOBIN A1C: HEMOGLOBIN A1C: 10.2 % — AB (ref 4.6–6.5)

## 2015-02-24 LAB — CBC
HEMATOCRIT: 41.9 % (ref 39.0–52.0)
HEMOGLOBIN: 14.8 g/dL (ref 13.0–17.0)
MCHC: 35.3 g/dL (ref 30.0–36.0)
MCV: 89.3 fl (ref 78.0–100.0)
PLATELETS: 185 10*3/uL (ref 150.0–400.0)
RBC: 4.7 Mil/uL (ref 4.22–5.81)
RDW: 13.4 % (ref 11.5–15.5)
WBC: 8.8 10*3/uL (ref 4.0–10.5)

## 2015-02-24 LAB — LIPID PANEL
CHOL/HDL RATIO: 7
CHOLESTEROL: 153 mg/dL (ref 0–200)
HDL: 21.9 mg/dL — ABNORMAL LOW (ref >39.00)
Triglycerides: 512 mg/dL — ABNORMAL HIGH (ref 0.0–149.0)

## 2015-02-24 LAB — TSH: TSH: 1.15 u[IU]/mL (ref 0.35–4.50)

## 2015-02-24 LAB — LDL CHOLESTEROL, DIRECT: LDL DIRECT: 62 mg/dL

## 2015-02-24 LAB — HEPATIC FUNCTION PANEL
ALT: 13 U/L (ref 0–53)
AST: 11 U/L (ref 0–37)
Albumin: 4.2 g/dL (ref 3.5–5.2)
Alkaline Phosphatase: 97 U/L (ref 39–117)
Bilirubin, Direct: 0.1 mg/dL (ref 0.0–0.3)
Total Bilirubin: 0.3 mg/dL (ref 0.2–1.2)
Total Protein: 7.1 g/dL (ref 6.0–8.3)

## 2015-02-24 NOTE — Telephone Encounter (Signed)
OK to write him a work note to keep him out from 5/3 to 5/10 due to medical condition

## 2015-02-24 NOTE — Telephone Encounter (Signed)
Copied labs/medication list and please advise on work note out from may 3 through may 10th..Marland Kitchen

## 2015-02-24 NOTE — Telephone Encounter (Signed)
Caller name:Wayde Habig Relationship to patient:self Can be reached:[phone] Pharmacy:  Reason for call:coming for lab work today/requesting list of meds to be at the front desk/needs work note, that he can not return to work until after MRI, meds make him feel uneasy

## 2015-02-24 NOTE — Telephone Encounter (Signed)
Letter completed and patient notified to pickup at the front desk at convenience.

## 2015-03-01 ENCOUNTER — Other Ambulatory Visit: Payer: Self-pay | Admitting: Family Medicine

## 2015-03-01 ENCOUNTER — Ambulatory Visit (HOSPITAL_BASED_OUTPATIENT_CLINIC_OR_DEPARTMENT_OTHER)
Admission: RE | Admit: 2015-03-01 | Discharge: 2015-03-01 | Disposition: A | Discharge status: Home / Self Care | Payer: Commercial Managed Care - PPO | Source: Ambulatory Visit | Attending: Family Medicine | Admitting: Family Medicine

## 2015-03-01 DIAGNOSIS — M542 Cervicalgia: Secondary | ICD-10-CM | POA: Insufficient documentation

## 2015-03-04 NOTE — Telephone Encounter (Signed)
Per insurance, PA not required.

## 2015-03-05 ENCOUNTER — Ambulatory Visit: Payer: Self-pay | Admitting: Endocrinology

## 2015-03-05 DIAGNOSIS — Z0289 Encounter for other administrative examinations: Secondary | ICD-10-CM

## 2015-03-08 ENCOUNTER — Encounter: Payer: Self-pay | Admitting: Family Medicine

## 2015-03-08 DIAGNOSIS — Z0000 Encounter for general adult medical examination without abnormal findings: Secondary | ICD-10-CM | POA: Insufficient documentation

## 2015-03-08 DIAGNOSIS — L989 Disorder of the skin and subcutaneous tissue, unspecified: Secondary | ICD-10-CM | POA: Insufficient documentation

## 2015-03-08 DIAGNOSIS — M542 Cervicalgia: Secondary | ICD-10-CM | POA: Insufficient documentation
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
## 2015-03-08 NOTE — Assessment & Plan Note (Signed)
Encouraged DASH diet, decrease po intake and increase exercise as tolerated. Needs 7-8 hours of sleep nightly. Avoid trans fats, eat small, frequent meals every 4-5 hours with lean proteins, complex carbs and healthy fats. Minimize simple carbs, GMO foods. 

## 2015-03-08 NOTE — Assessment & Plan Note (Signed)
Referred to dermatology 

## 2015-03-08 NOTE — Assessment & Plan Note (Signed)
Patient encouraged to maintain heart healthy diet, regular exercise, adequate sleep. Consider daily probiotics. Take medications as prescribed. Labs ordered and reviewed 

## 2015-03-08 NOTE — Assessment & Plan Note (Signed)
Tolerating statin, encouraged heart healthy diet, avoid trans fats, minimize simple carbs and saturated fats. Increase exercise as tolerated 

## 2015-03-21 ENCOUNTER — Other Ambulatory Visit: Payer: Self-pay | Admitting: Family Medicine

## 2015-03-31 ENCOUNTER — Other Ambulatory Visit: Payer: Self-pay | Admitting: Family Medicine

## 2015-04-15 ENCOUNTER — Other Ambulatory Visit: Payer: Self-pay | Admitting: Endocrinology

## 2015-04-17 ENCOUNTER — Other Ambulatory Visit: Payer: Self-pay | Admitting: Family Medicine

## 2015-04-17 MED ORDER — CARVEDILOL 25 MG PO TABS: ORAL_TABLET | ORAL | Status: DC

## 2015-04-17 MED ORDER — ATORVASTATIN CALCIUM 40 MG PO TABS
40.0000 mg | ORAL_TABLET | Freq: Every day | ORAL | Status: DC
Start: 2015-04-17 — End: 2015-12-25

## 2015-04-25 ENCOUNTER — Telehealth: Payer: Self-pay | Admitting: Family Medicine

## 2015-04-25 NOTE — Telephone Encounter (Signed)
Caller name: [place] Relation to pt:  Call back number:  [phone] Pharmacy:  Reason for call:   Patient states that he will be having an injection in his neck by Dr. Dutch QuintPoole and is needing to be off of aspirin for 6 days prior to injection. He wants to know if this is ok?

## 2015-04-25 NOTE — Telephone Encounter (Signed)
Yes OK to hold aspirin for 6 days prior to injection

## 2015-04-25 NOTE — Telephone Encounter (Signed)
Called left message to call back 

## 2015-04-28 NOTE — Telephone Encounter (Signed)
Advise if ok to do letter.

## 2015-04-28 NOTE — Telephone Encounter (Signed)
Pt asked that we fax a letter to Dr. Dutch QuintPoole that Dr. Abner GreenspanBlyth has said it is ok to hold aspirin for 6 days prior to injection. Fax # [phone]. AttnMorrie Sheldon: Ashley

## 2015-04-28 NOTE — Telephone Encounter (Signed)
Called the patient left message to call back 

## 2015-04-28 NOTE — Telephone Encounter (Signed)
Yes please do the letter.

## 2015-04-29 ENCOUNTER — Other Ambulatory Visit: Payer: Self-pay | Admitting: Family Medicine

## 2015-04-29 ENCOUNTER — Encounter: Payer: Self-pay | Admitting: Family Medicine

## 2015-04-29 NOTE — Telephone Encounter (Signed)
Letter completed and faxed to Attn: Morrie SheldonAshley as instructed

## 2015-05-05 ENCOUNTER — Other Ambulatory Visit: Payer: Self-pay | Admitting: Family Medicine

## 2015-05-29 ENCOUNTER — Encounter: Payer: Self-pay | Admitting: Family Medicine

## 2015-05-29 ENCOUNTER — Ambulatory Visit (HOSPITAL_BASED_OUTPATIENT_CLINIC_OR_DEPARTMENT_OTHER)
Admission: RE | Admit: 2015-05-29 | Discharge: 2015-05-29 | Disposition: A | Discharge status: Home / Self Care | Payer: Commercial Managed Care - PPO | Source: Ambulatory Visit | Attending: Family Medicine | Admitting: Family Medicine

## 2015-05-29 ENCOUNTER — Ambulatory Visit (INDEPENDENT_AMBULATORY_CARE_PROVIDER_SITE_OTHER): Payer: Commercial Managed Care - PPO | Admitting: Family Medicine

## 2015-05-29 VITALS — BP 132/80 | HR 84 | Temp 98.2°F | Ht 74.0 in | Wt 243.1 lb

## 2015-05-29 DIAGNOSIS — E669 Obesity, unspecified: Secondary | ICD-10-CM | POA: Diagnosis not present

## 2015-05-29 DIAGNOSIS — E1122 Type 2 diabetes mellitus with diabetic chronic kidney disease: Secondary | ICD-10-CM

## 2015-05-29 DIAGNOSIS — Z720 Tobacco use: Secondary | ICD-10-CM

## 2015-05-29 DIAGNOSIS — E785 Hyperlipidemia, unspecified: Secondary | ICD-10-CM | POA: Diagnosis not present

## 2015-05-29 DIAGNOSIS — I10 Essential (primary) hypertension: Secondary | ICD-10-CM | POA: Diagnosis not present

## 2015-05-29 DIAGNOSIS — R5383 Other fatigue: Secondary | ICD-10-CM

## 2015-05-29 DIAGNOSIS — R059 Cough, unspecified: Secondary | ICD-10-CM

## 2015-05-29 DIAGNOSIS — R630 Anorexia: Secondary | ICD-10-CM

## 2015-05-29 DIAGNOSIS — E119 Type 2 diabetes mellitus without complications: Secondary | ICD-10-CM | POA: Diagnosis not present

## 2015-05-29 DIAGNOSIS — R05 Cough: Secondary | ICD-10-CM

## 2015-05-29 DIAGNOSIS — F17200 Nicotine dependence, unspecified, uncomplicated: Secondary | ICD-10-CM | POA: Diagnosis not present

## 2015-05-29 DIAGNOSIS — E1169 Type 2 diabetes mellitus with other specified complication: Secondary | ICD-10-CM

## 2015-05-29 DIAGNOSIS — N189 Chronic kidney disease, unspecified: Secondary | ICD-10-CM

## 2015-05-29 DIAGNOSIS — M542 Cervicalgia: Secondary | ICD-10-CM

## 2015-05-29 LAB — COMPREHENSIVE METABOLIC PANEL
ALBUMIN: 4.7 g/dL (ref 3.5–5.2)
ALK PHOS: 105 U/L (ref 39–117)
ALT: 15 U/L (ref 0–53)
AST: 14 U/L (ref 0–37)
BUN: 34 mg/dL — ABNORMAL HIGH (ref 6–23)
CO2: 26 mEq/L (ref 19–32)
Calcium: 10.1 mg/dL (ref 8.4–10.5)
Chloride: 92 mEq/L — ABNORMAL LOW (ref 96–112)
Creatinine, Ser: 1.95 mg/dL — ABNORMAL HIGH (ref 0.40–1.50)
GFR: 37.2 mL/min — ABNORMAL LOW (ref >60.00)
GLUCOSE: 474 mg/dL — AB (ref 70–99)
POTASSIUM: 4.8 meq/L (ref 3.5–5.1)
SODIUM: 129 meq/L — AB (ref 135–145)
TOTAL PROTEIN: 7.8 g/dL (ref 6.0–8.3)
Total Bilirubin: 0.4 mg/dL (ref 0.2–1.2)

## 2015-05-29 LAB — CBC
HEMATOCRIT: 43 % (ref 39.0–52.0)
HEMOGLOBIN: 15 g/dL (ref 13.0–17.0)
MCHC: 35 g/dL (ref 30.0–36.0)
MCV: 90.4 fl (ref 78.0–100.0)
Platelets: 232 10*3/uL (ref 150.0–400.0)
RBC: 4.75 Mil/uL (ref 4.22–5.81)
RDW: 13.2 % (ref 11.5–15.5)
WBC: 11.2 10*3/uL — AB (ref 4.0–10.5)

## 2015-05-29 LAB — LDL CHOLESTEROL, DIRECT: Direct LDL: 46 mg/dL

## 2015-05-29 LAB — LIPID PANEL
CHOL/HDL RATIO: 6
Cholesterol: 153 mg/dL (ref 0–200)
HDL: 24.6 mg/dL — AB (ref >39.00)
Triglycerides: 637 mg/dL — ABNORMAL HIGH (ref 0.0–149.0)

## 2015-05-29 LAB — TSH: TSH: 0.82 u[IU]/mL (ref 0.35–4.50)

## 2015-05-29 LAB — HEMOGLOBIN A1C: HEMOGLOBIN A1C: 13.3 % — AB (ref 4.6–6.5)

## 2015-05-29 MED ORDER — ALBUTEROL SULFATE HFA 108 (90 BASE) MCG/ACT IN AERS: 2.0000 | INHALATION_SPRAY | Freq: Four times a day (QID) | RESPIRATORY_TRACT | Status: AC | PRN

## 2015-05-29 MED ORDER — DOXYCYCLINE HYCLATE 100 MG PO TABS: 100.0000 mg | ORAL_TABLET | Freq: Two times a day (BID) | ORAL | Status: DC

## 2015-05-29 NOTE — Patient Instructions (Addendum)
Daily probiotic helpful [facility] that provides a little better coverage.  Acute Bronchitis Bronchitis is inflammation of the airways that extend from the windpipe into the lungs (bronchi). The inflammation often causes mucus to develop. This leads to a cough, which is the most common symptom of bronchitis.  In acute bronchitis, the condition usually develops suddenly and goes away over time, usually in a couple weeks. Smoking, allergies, and asthma can make bronchitis worse. Repeated episodes of bronchitis may cause further lung problems.  CAUSES Acute bronchitis is most often caused by the same virus that causes a cold. The virus can spread from person to person (contagious) through coughing, sneezing, and touching contaminated objects. SIGNS AND SYMPTOMS   Cough.   Fever.   Coughing up mucus.   Body aches.   Chest congestion.   Chills.   Shortness of breath.   Sore throat.  DIAGNOSIS  Acute bronchitis is usually diagnosed through a physical exam. Your health care provider will also ask you questions about your medical history. Tests, such as chest X-rays, are sometimes done to rule out other conditions.  TREATMENT  Acute bronchitis usually goes away in a couple weeks. Oftentimes, no medical treatment is necessary. Medicines are sometimes given for relief of fever or cough. Antibiotic medicines are usually not needed but may be prescribed in certain situations. In some cases, an inhaler may be recommended to help reduce shortness of breath and control the cough. A cool mist vaporizer may also be used to help thin bronchial secretions and make it easier to clear the chest.  HOME CARE INSTRUCTIONS  Get plenty of rest.   Drink enough fluids to keep your urine clear or pale yellow (unless you have a medical condition that requires fluid restriction). Increasing fluids may help thin your respiratory secretions (sputum)  and reduce chest congestion, and it will prevent dehydration.   Take medicines only as directed by your health care provider.  If you were prescribed an antibiotic medicine, finish it all even if you start to feel better.  Avoid smoking and secondhand smoke. Exposure to cigarette smoke or irritating chemicals will make bronchitis worse. If you are a smoker, consider using nicotine gum or skin patches to help control withdrawal symptoms. Quitting smoking will help your lungs heal faster.   Reduce the chances of another bout of acute bronchitis by washing your hands frequently, avoiding people with cold symptoms, and trying not to touch your hands to your mouth, nose, or eyes.   Keep all follow-up visits as directed by your health care provider.  SEEK MEDICAL CARE IF: Your symptoms do not improve after 1 week of treatment.  SEEK IMMEDIATE MEDICAL CARE IF:  You develop an increased fever or chills.   You have chest pain.   You have severe shortness of breath.  You have bloody sputum.   You develop dehydration.  You faint or repeatedly feel like you are going to pass out.  You develop repeated vomiting.  You develop a severe headache. MAKE SURE YOU:   Understand these instructions.  Will watch your condition.  Will get help right away if you are not doing well or get worse. Document Released: 11/11/2004 Document Revised: 02/18/2014 Document Reviewed: 03/27/2013 [facility] Patient Information 2015 [place], [place]. This information is not intended to replace advice given to you by your health care provider. Make sure you discuss any questions you have with your health care provider.

## 2015-05-29 NOTE — Assessment & Plan Note (Signed)
Well controlled, no changes to meds. Encouraged heart healthy diet such as the DASH diet and exercise as tolerated.  °

## 2015-05-29 NOTE — Progress Notes (Signed)
Pre visit review using our clinic review tool, if applicable. No additional management support is needed unless otherwise documented below in the visit note. 

## 2015-05-29 NOTE — Progress Notes (Signed)
Patient ID: Gabriel [name], male   DOB: January 08, 1951, 64 y.o.   MRN: [id number]   Vansh Reckart  male [id number] 07/17/1951 64 y.o. 05/29/2015      Progress Note-Follow Up  Subjective   HPI  Patient is in today for follow up complaining of a recent cough with nausea and vomiting this week. He has head congestion with green rhinorrhea. Also notes malaise and myalgias frecently. Has been struggling with decreased appetite for roughly 2 months. Has been trying to cut down on smoking but is struggling. Denies CP/palp/SOB/HA/congestion/fevers/GI or GU c/o. Taking meds as prescribed Chief Complaint  Patient presents with  . Follow-up    Past Medical History  Diagnosis Date  . Arthritis   . Depression   . History of chronic bronchitis     dx 2011  . Seasonal allergies   . Aortic stenosis   . Hypertension   . Diabetes mellitus, type 2   . Hyperlipidemia   . History of stroke 1992    blood clot at base of brain-[facility]  . Congestive heart failure     September 2011-CHF  . History of chicken pox   . Obesity, unspecified 11/27/2012  . Tobacco abuse 11/27/2012    Down from 3 ppd to 1/2 ppd  . Allergic state 02/08/2013  . Acute low back pain with radicular symptoms, duration less than 6 weeks 05/16/2013  . Viral URI 05/26/2014  . Unspecified hereditary and idiopathic peripheral neuropathy 05/26/2014    Tingling in toes  . Preventative health care 03/08/2015    Past Surgical History  Procedure Laterality Date  . Knee surgery  1976, 1989    right knee torn ligament repair  . Lumbar fusion  1982    L4-L5  . Revision total knee arthroplasty  2007    right knee replacement    Family History  Problem Relation Age of Onset  . Arthritis      mother/father/paternal grandparents  . Heart disease Father     pacemaker  . Hypertension Father   . Hypertension Paternal Grandfather   . Hypertension Brother   . Emotional abuse Mother   . Diabetes Brother      x 2  . Diabetes  Paternal Grandmother     Social History   Social History  . Marital Status: Widowed    Spouse Name: N/A  . Number of Children: 2  . 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 Smoking status: Current Every Day Smoker -- 1.00 packs/day  . Smokeless tobacco: Never Used     Comment: smokes a pack per day-started in 1972, trying to stop smokes 10 per day.  . Alcohol Use: No  . Drug Use: No  . Sexual Activity: No   Other Topics Concern  . Not on file   Social History Narrative   Regular exercise-no   Caffeine Use-yes          Current Outpatient Prescriptions on File Prior to Visit  Medication Sig Dispense Refill  . amLODipine (NORVASC) 10 MG tablet Take 1 tablet (10 mg total) by mouth daily. 90 tablet 1  . aspirin 81 MG tablet Take 81 mg by mouth daily.      Marland Kitchen atorvastatin (LIPITOR) 40 MG tablet Take 1 tablet (40 mg total) by mouth daily. 90 tablet 2  . carvedilol (COREG) 25 MG tablet TAKE ONE AND ONE-HALF TABLETS TWICE A DAY 270 tablet  2  . cyclobenzaprine (FLEXERIL) 10 MG tablet TAKE ONE TABLET BY MOUTH AT BEDTIME AS NEEDED FOR MUSCLE SPASM 30 tablet 0  . diclofenac (VOLTAREN) 75 MG EC tablet Take 1 tablet (75 mg total) by mouth 2 (two) times daily. 30 tablet 0  . fluticasone (FLONASE) 50 MCG/ACT nasal spray Place 2 sprays into the nose daily as needed for rhinitis or allergies. 16 g 6  . furosemide (LASIX) 40 MG tablet Take 0.5 tablets (20 mg total) by mouth daily. 45 tablet 3  . gemfibrozil (LOPID) 600 MG tablet TAKE 1 TABLET TWICE A DAY BEFORE MEALS 180 tablet 1  . HYDROcodone-acetaminophen (NORCO) 5-325 MG per tablet Take 1 tablet by mouth every 6 (six) hours as needed for moderate pain or severe pain. 12 tablet 0  . Insulin Glargine (LANTUS SOLOSTAR) 100 UNIT/ML Solostar Pen INJECT 140 UNITS UNDER THE SKIN DAILY AT 10:00 P.M. (APPOINTMENT NEEDED FOR FURTHER REFILLS) 3 mL 1  . Insulin Pen Needle 31G X 5 MM  MISC USE AS DIRECTED ONE TIME DAILY. 150 each 1  . Krill Oil CAPS MegaRed or a generic by schiff daily    . LANTUS SOLOSTAR 100 UNIT/ML Solostar Pen INJECT 140 UNITS (1.4 ML) UNDER THE SKIN DAILY (APPOINTMENT NEEDED FOR FURTHER REFILLS) 45 mL 2  . lisinopril (PRINIVIL,ZESTRIL) 40 MG tablet TAKE 1 TABLET DAILY 90 tablet 2  . methocarbamol (ROBAXIN) 500 MG tablet TAKE ONE TABLET BY MOUTH TWICE DAILY AS NEEDED FOR PAIN OR MUSCLE SPASM 60 tablet 0  . Multiple Vitamin (MULTIVITAMIN) tablet Take 1 tablet by mouth daily.      . sertraline (ZOLOFT) 100 MG tablet Take 1.5 tablets (150 mg total) by mouth daily. 135 tablet 1  . triamterene-hydrochlorothiazide (MAXZIDE-25) 37.5-25 MG per tablet Take 1 tablet by mouth daily. 90 tablet 1  . albuterol (PROVENTIL HFA;VENTOLIN HFA) 108 (90 BASE) MCG/ACT inhaler Inhale 2 puffs into the lungs every 6 (six) hours as needed for wheezing. 1 Inhaler 0   No current facility-administered medications on file prior to visit.    No Known Allergies  Review of Systems  Review of Systems  Constitutional: Positive for malaise/fatigue. Negative for fever.  HENT: Positive for congestion and ear discharge.   Eyes: Negative for discharge.  Respiratory: Positive for cough. Negative for shortness of breath and wheezing.   Cardiovascular: Negative for chest pain, palpitations and leg swelling.  Gastrointestinal: Negative for nausea and abdominal pain.  Genitourinary: Negative for dysuria.  Musculoskeletal: Negative for falls.  Skin: Negative for rash.  Neurological: Negative for loss of consciousness and headaches.  Endo/Heme/Allergies: Negative for environmental allergies.  Psychiatric/Behavioral: Negative for depression. The patient is not nervous/anxious.     Objective  Filed Vitals:   05/29/15 0830  BP: 132/80  Pulse: 84  Temp: 98.2 F (36.8 C)  TempSrc: Oral  Height: 6\' 2"  (1.88 m)  Weight: 243 lb 2 oz (110.281 kg)  SpO2: 93%   Body mass index is 31.2  kg/(m^2).  Physical Exam  Physical Exam  Lab Results  Component Value Date   TSH 1.15 02/24/2015   Lab Results  Component Value Date   WBC 8.8 02/24/2015   HGB 14.8 02/24/2015   HCT 41.9 02/24/2015   MCV 89.3 02/24/2015   PLT 185.0 02/24/2015   Lab Results  Component Value Date   GFR 50.39* 02/24/2015   GFR 50.39* 02/24/2015   Lab Results  Component Value Date   CHOL 153 02/24/2015   Lab Results  Component Value  Date   HDL 21.90* 02/24/2015   Lab Results  Component Value Date   LDLCALC NOT CALC 05/15/2014   Lab Results  Component Value Date   TRIG 512.0* 02/24/2015   Lab Results  Component Value Date   CHOLHDL 7 02/24/2015   Lab Results  Component Value Date   HGBA1C 10.2* 02/24/2015      Assessment & Plan  HTN (hypertension) Well controlled, no changes to meds. Encouraged heart healthy diet such as the DASH diet and exercise as tolerated.   Neck pain Dr Dutch Quint performs injections which are helpful  Obesity Encouraged DASH diet, decrease po intake and increase exercise as tolerated. Needs 7-8 hours of sleep nightly. Avoid trans fats, eat small, frequent meals every 4-5 hours with lean proteins, complex carbs and healthy fats. Minimize simple carb  Hyperlipidemia Tolerating statin, encouraged heart healthy diet, avoid trans fats, minimize simple carbs and saturated fats. Increase exercise as tolerated  Tobacco abuse Encouraged complete cessation. Discussed need to quit as relates to risk of numerous cancers, cardiac and pulmonary disease as well as neurologic complications. Counseled for greater than 3 minutes  Diabetes Poor control he is seeing endocrinology later this month so his labs are drawn in preparation. Encouraged to minimize simple carbs and increase insulin til seen

## 2015-05-30 ENCOUNTER — Telehealth: Payer: Self-pay | Admitting: Family Medicine

## 2015-05-30 ENCOUNTER — Telehealth: Payer: Self-pay

## 2015-05-30 MED ORDER — DOXYCYCLINE HYCLATE 100 MG PO TABS: 100.0000 mg | ORAL_TABLET | Freq: Two times a day (BID) | ORAL | Status: DC

## 2015-05-30 NOTE — Telephone Encounter (Signed)
Rx for doxy sent to [facility] and notified of all below results. Pt states he will try to move his endocrinology appointment up and will start new regimen of insulin. No questions at this time.

## 2015-05-30 NOTE — Telephone Encounter (Signed)
-----   Message from Bradd Canary, MD sent at 05/30/2015  9:14 AM EDT ----- Notify sugar is very hi, he should start meal time insulin and severely restrict his carbohydrates, he really should accept a referral to endocrinology but at visit he was very resistent to referral. Notify him his sodium is low enough secondary to his hi sugar that it could start start to cause him significant trouble. Since he is worried about cost would offer to send in Relion regular insulin to [facility], I think he will need syringes as well because it does not come in Pen. Have him start at 10 units per meal. And if his sugars remain above 150 consistently then increase each dose of insulin by 2 units next week and let us know his numbers.

## 2015-05-30 NOTE — Telephone Encounter (Signed)
Caller name: Amanuel Sinkfield Relationship to patient: Self   Can be reached: [phone] Pharmacy:  Reason for call: pt would like to have his doxycycline script sent to [facility] on [address] instead of [address]. He says that they told him that it will take 24 hours for [facility] to send it to them and he needs it right away.

## 2015-06-01 ENCOUNTER — Other Ambulatory Visit: Payer: Self-pay | Admitting: Family Medicine

## 2015-06-01 NOTE — Telephone Encounter (Signed)
done

## 2015-06-11 ENCOUNTER — Ambulatory Visit (INDEPENDENT_AMBULATORY_CARE_PROVIDER_SITE_OTHER): Payer: Commercial Managed Care - PPO | Admitting: Endocrinology

## 2015-06-11 ENCOUNTER — Encounter: Payer: Self-pay | Admitting: Endocrinology

## 2015-06-11 VITALS — BP 132/82 | HR 75 | Temp 98.1°F | Ht 74.0 in | Wt 250.0 lb

## 2015-06-11 DIAGNOSIS — N189 Chronic kidney disease, unspecified: Secondary | ICD-10-CM

## 2015-06-11 DIAGNOSIS — E1122 Type 2 diabetes mellitus with diabetic chronic kidney disease: Secondary | ICD-10-CM

## 2015-06-11 MED ORDER — INSULIN GLARGINE 100 UNIT/ML SOLOSTAR PEN: 200.0000 [IU] | PEN_INJECTOR | Freq: Every day | SUBCUTANEOUS | Status: DC

## 2015-06-11 NOTE — Progress Notes (Signed)
Subjective:    Patient ID: Gabriel [name], male    DOB: 10/07/1951, 64 y.o.   MRN: [id number]  HPI Pt returns for f/u of diabetes mellitus: DM type: Insulin-requiring type 2 Dx'ed: 1994 Complications: CVA and renal insufficiency Therapy: insulin since 2000 DKA: never Severe hypoglycemia: never Pancreatitis: never   Other: he works 3rd shift. Interval history: He was changed to lantus alone, 140 units qd, due to poor results with multiple daily injections.  He says he never misses the insulin injections.  no cbg record, but states cbg's vary from 120-220.  It is lowest when he misses a meal at work (3rd shift) Past Medical History  Diagnosis Date  . Arthritis   . Depression   . History of chronic bronchitis     dx 2011  . Seasonal allergies   . Aortic stenosis   . Hypertension   . Diabetes mellitus, type 2   . Hyperlipidemia   . History of stroke 1992    blood clot at base of brain-[facility]  . Congestive heart failure     September 2011-CHF  . History of chicken pox   . Obesity, unspecified 11/27/2012  . Tobacco abuse 11/27/2012    Down from 3 ppd to 1/2 ppd  . Allergic state 02/08/2013  . Acute low back pain with radicular symptoms, duration less than 6 weeks 05/16/2013  . Viral URI 05/26/2014  . Unspecified hereditary and idiopathic peripheral neuropathy 05/26/2014    Tingling in toes  . Preventative health care 03/08/2015    Past Surgical History  Procedure Laterality Date  . Knee surgery  1976, 1989    right knee torn ligament repair  . Lumbar fusion  1982    L4-L5  . Revision total knee arthroplasty  2007    right knee replacement    Social History   Social History  . Marital Status: Widowed    Spouse Name: N/A  . Number of Children: 2  . 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 Smoking status: Current Every Day Smoker -- 1.00 packs/day  . Smokeless tobacco: Never Used   Comment: smokes a pack per day-started in 1972, trying to stop smokes 10 per day.  . Alcohol Use: No  . Drug Use: No  . Sexual Activity: No   Other Topics Concern  . Not on file   Social History Narrative   Regular exercise-no   Caffeine Use-yes          Current Outpatient Prescriptions on File Prior to Visit  Medication Sig Dispense Refill  . albuterol (PROVENTIL HFA;VENTOLIN HFA) 108 (90 BASE) MCG/ACT inhaler Inhale 2 puffs into the lungs every 6 (six) hours as needed for wheezing. 1 Inhaler 1  . amLODipine (NORVASC) 10 MG tablet Take 1 tablet (10 mg total) by mouth daily. 90 tablet 1  . aspirin 81 MG tablet Take 81 mg by mouth daily.      Marland Kitchen atorvastatin (LIPITOR) 40 MG tablet Take 1 tablet (40 mg total) by mouth daily. 90 tablet 2  . carvedilol (COREG) 25 MG tablet TAKE ONE AND ONE-HALF TABLETS TWICE A DAY 270 tablet 2  . cyclobenzaprine (FLEXERIL) 10 MG tablet TAKE ONE TABLET BY MOUTH AT BEDTIME AS NEEDED FOR MUSCLE SPASM 30 tablet 0  . diclofenac (VOLTAREN) 75 MG EC tablet Take 1 tablet (75 mg total) by mouth 2 (two) times daily. 30 tablet 0  .  doxycycline (VIBRA-TABS) 100 MG tablet Take 1 tablet (100 mg total) by mouth 2 (two) times daily. 20 tablet 0  . fluticasone (FLONASE) 50 MCG/ACT nasal spray Place 2 sprays into the nose daily as needed for rhinitis or allergies. 16 g 6  . furosemide (LASIX) 40 MG tablet Take 0.5 tablets (20 mg total) by mouth daily. 45 tablet 3  . gemfibrozil (LOPID) 600 MG tablet TAKE 1 TABLET TWICE A DAY BEFORE MEALS 180 tablet 1  . HYDROcodone-acetaminophen (NORCO) 5-325 MG per tablet Take 1 tablet by mouth every 6 (six) hours as needed for moderate pain or severe pain. 12 tablet 0  . Insulin Pen Needle 31G X 5 MM MISC USE AS DIRECTED ONE TIME DAILY. 150 each 1  . Krill Oil CAPS MegaRed or a generic by schiff daily    . lisinopril (PRINIVIL,ZESTRIL) 40 MG tablet TAKE 1 TABLET DAILY 90 tablet 2  . methocarbamol (ROBAXIN) 500 MG tablet TAKE ONE TABLET BY  MOUTH TWICE DAILY AS NEEDED FOR PAIN OR MUSCLE SPASM 60 tablet 0  . Multiple Vitamin (MULTIVITAMIN) tablet Take 1 tablet by mouth daily.      . sertraline (ZOLOFT) 100 MG tablet Take 1.5 tablets (150 mg total) by mouth daily. 135 tablet 1  . triamterene-hydrochlorothiazide (MAXZIDE-25) 37.5-25 MG per tablet Take 1 tablet by mouth daily. 90 tablet 1   No current facility-administered medications on file prior to visit.    No Known Allergies  Family History  Problem Relation Age of Onset  . Arthritis      mother/father/paternal grandparents  . Heart disease Father     pacemaker  . Hypertension Father   . Hypertension Paternal Grandfather   . Hypertension Brother   . Emotional abuse Mother   . Diabetes Brother      x 2  . Diabetes Paternal Grandmother     BP 132/82 mmHg  Pulse 75  Temp(Src) 98.1 F (36.7 C) (Oral)  Ht 6\' 2"  (1.88 m)  Wt 250 lb (113.399 kg)  BMI 32.08 kg/m2  SpO2 98%  Review of Systems He denies hypoglycemia    Objective:   Physical Exam VITAL SIGNS:  See vs page GENERAL: no distress Pulses: dorsalis pedis intact bilat.   MSK: no deformity of the feet CV: no leg edema Skin:  no ulcer on the feet.  normal color and temp on the feet. Neuro: sensation is intact to touch on the feet  i have reviewed outside records:  Pt was changed to qd insulin, after poor results with multiple daily injections  Lab Results  Component Value Date   HGBA1C 13.3* 05/29/2015      Assessment & Plan:  DM: severe exacerbation: Although his a1c is higher, I agree with the change to qd insulin.  He just needs a higher dosage Obesity: stable.  Patient is advised the following: Patient Instructions  check your blood sugar twice a day.  vary the time of day when you check, between before the 3 meals, and at bedtime.  also check if you have symptoms of your blood sugar being too high or too low.  please keep a record of the readings and bring it to your next appointment here.   You can write it on any piece of paper.  please call us sooner if your blood sugar goes below 70, or if you have a lot of readings over 200.  Please increase the lantus to 200 units daily, and: Stay off the humalog.  Please come  back for a follow-up appointment in 2 months.   On this type of insulin schedule, you should eat meals on a regular schedule.  If a meal is missed or significantly delayed, your blood sugar could go low. If necessary, carry a non-perishable snack, such as crackers.   Please consider having weight loss surgery.  It is good for your health.  Here is some information about it.  If you decide to consider further, please call the phone number in the papers, and register for a free informational meeting

## 2015-06-11 NOTE — Patient Instructions (Addendum)
check your blood sugar twice a day.  vary the time of day when you check, between before the 3 meals, and at bedtime.  also check if you have symptoms of your blood sugar being too high or too low.  please keep a record of the readings and bring it to your next appointment here.  You can write it on any piece of paper.  please call us sooner if your blood sugar goes below 70, or if you have a lot of readings over 200.  Please increase the lantus to 200 units daily, and: Stay off the humalog.  Please come back for a follow-up appointment in 2 months.   On this type of insulin schedule, you should eat meals on a regular schedule.  If a meal is missed or significantly delayed, your blood sugar could go low. If necessary, carry a non-perishable snack, such as crackers.   Please consider having weight loss surgery.  It is good for your health.  Here is some information about it.  If you decide to consider further, please call the phone number in the papers, and register for a free informational meeting

## 2015-06-15 ENCOUNTER — Encounter: Payer: Self-pay | Admitting: Family Medicine

## 2015-06-15 NOTE — Assessment & Plan Note (Signed)
Tolerating statin, encouraged heart healthy diet, avoid trans fats, minimize simple carbs and saturated fats. Increase exercise as tolerated 

## 2015-06-15 NOTE — Assessment & Plan Note (Signed)
Poor control he is seeing endocrinology later this month so his labs are drawn in preparation. Encouraged to minimize simple carbs and increase insulin til seen

## 2015-06-15 NOTE — Assessment & Plan Note (Signed)
Encouraged complete cessation. Discussed need to quit as relates to risk of numerous cancers, cardiac and pulmonary disease as well as neurologic complications. Counseled for greater than 3 minutes 

## 2015-06-15 NOTE — Assessment & Plan Note (Signed)
Encouraged DASH diet, decrease po intake and increase exercise as tolerated. Needs 7-8 hours of sleep nightly. Avoid trans fats, eat small, frequent meals every 4-5 hours with lean proteins, complex carbs and healthy fats. Minimize simple carb 

## 2015-06-15 NOTE — Assessment & Plan Note (Signed)
Dr Dutch Quint performs injections which are helpful

## 2015-06-30 ENCOUNTER — Other Ambulatory Visit: Payer: Self-pay | Admitting: Family Medicine

## 2015-07-28 ENCOUNTER — Other Ambulatory Visit: Payer: Self-pay | Admitting: Family Medicine

## 2015-08-11 ENCOUNTER — Ambulatory Visit: Payer: Self-pay | Admitting: Endocrinology

## 2015-08-27 ENCOUNTER — Encounter: Payer: Self-pay | Admitting: Medical

## 2015-08-27 ENCOUNTER — Ambulatory Visit (INDEPENDENT_AMBULATORY_CARE_PROVIDER_SITE_OTHER): Payer: Commercial Managed Care - PPO | Admitting: Medical

## 2015-08-27 VITALS — BP 138/82 | HR 77 | Temp 97.9°F | Ht 74.0 in | Wt 224.0 lb

## 2015-08-27 DIAGNOSIS — R066 Hiccough: Secondary | ICD-10-CM | POA: Diagnosis not present

## 2015-08-27 DIAGNOSIS — R142 Eructation: Secondary | ICD-10-CM | POA: Diagnosis not present

## 2015-08-27 MED ORDER — OMEPRAZOLE 20 MG PO CPDR: 20.0000 mg | DELAYED_RELEASE_CAPSULE | Freq: Every day | ORAL | Status: DC

## 2015-08-27 MED ORDER — METOCLOPRAMIDE HCL 10 MG PO TABS
10.0000 mg | ORAL_TABLET | Freq: Three times a day (TID) | ORAL | Status: DC
Start: 2015-08-27 — End: 2016-08-10

## 2015-08-27 NOTE — Progress Notes (Signed)
Pre visit review using our clinic review tool, if applicable. No additional management support is needed unless otherwise documented below in the visit note. 

## 2015-08-27 NOTE — Patient Instructions (Addendum)
Your hiccups have resolved now. Hx of hiatal hernia with rare heatburn.   Will rx omeprazole and reglan if your hiccups return. If so let us know and may need work up depending on how you are/if not responding to tx  If constipation returns can use dulcolax and hydrate well. If abdomen pain with constipation let us know may do imaging studies.  Follow up in 7 days or as needed.

## 2015-08-27 NOTE — Progress Notes (Signed)
Subjective:    Patient ID: Gabriel [name], male    DOB: [birth year], 64 y.o.   MRN: [id number]  HPI  Pt states he had hicups for last 24 hours but then around 11 am he had large belch  and his symptoms/ hiccups stopped.   Pt stomach felt hard and he was belching all day long. He denies of heart. No fevers, no sweats and no chills.  Pt had some constipation for 3 days. But then large bowel movement this am. No hx of abdomen surgery   Review of Systems  Constitutional: Negative for fever, chills and fatigue.  Respiratory: Negative for cough, chest tightness, shortness of breath and wheezing.   Cardiovascular: Negative for chest pain and palpitations.  Gastrointestinal: Positive for abdominal distention. Negative for abdominal pain.       Some distension yesterday. With belching when hic upping.  Musculoskeletal: Negative for back pain.  Skin: Negative for rash.  Neurological: Negative for dizziness and headaches.  Hematological: Negative for adenopathy. Does not bruise/bleed easily.     Past Medical History  Diagnosis Date  . Arthritis   . Depression   . History of chronic bronchitis     dx 2011  . Seasonal allergies   . Aortic stenosis   . Hypertension   . Diabetes mellitus, type 2 (HCC)   . Hyperlipidemia   . History of stroke 1992    blood clot at base of brain-[facility]  . Congestive heart failure (HCC)     September 2011-CHF  . History of chicken pox   . Obesity, unspecified 11/27/2012  . Tobacco abuse 11/27/2012    Down from 3 ppd to 1/2 ppd  . Allergic state 02/08/2013  . Acute low back pain with radicular symptoms, duration less than 6 weeks 05/16/2013  . Viral URI 05/26/2014  . Unspecified hereditary and idiopathic peripheral neuropathy 05/26/2014    Tingling in toes  . Preventative health care 03/08/2015    Social History   Social History  . Marital Status: Widowed    Spouse Name: N/A  . Number of Children: 2  . Years of Education: 16    Occupational History  . Security  [employer]   Social History Main Topics  . Smoking status: Current Every Day Smoker -- 1.00 packs/day  . Smokeless tobacco: Never Used     Comment: smokes a pack per day-started in 1972, trying to stop smokes 10 per day.  . Alcohol Use: No  . Drug Use: No  . Sexual Activity: No   Other Topics Concern  . Not on file   Social History Narrative   Regular exercise-no   Caffeine Use-yes          Past Surgical History  Procedure Laterality Date  . Knee surgery  1976, 1989    right knee torn ligament repair  . Lumbar fusion  1982    L4-L5  . Revision total knee arthroplasty  2007    right knee replacement    Family History  Problem Relation Age of Onset  . Arthritis      mother/father/paternal grandparents  . Heart disease Father     pacemaker  . Hypertension Father   . Hypertension Paternal Grandfather   . Hypertension Brother   . Emotional abuse Mother   . Diabetes Brother      x 2  . Diabetes Paternal Grandmother     No Known Allergies  Current Outpatient Prescriptions on File Prior to Visit  Medication  Sig Dispense Refill  . albuterol (PROVENTIL HFA;VENTOLIN HFA) 108 (90 BASE) MCG/ACT inhaler Inhale 2 puffs into the lungs every 6 (six) hours as needed for wheezing. 1 Inhaler 1  . amLODipine (NORVASC) 10 MG tablet Take 1 tablet (10 mg total) by mouth daily. 90 tablet 1  . aspirin 81 MG tablet Take 81 mg by mouth daily.      Marland Kitchen atorvastatin (LIPITOR) 40 MG tablet Take 1 tablet (40 mg total) by mouth daily. 90 tablet 2  . carvedilol (COREG) 25 MG tablet TAKE ONE AND ONE-HALF TABLETS TWICE A DAY 270 tablet 2  . cyclobenzaprine (FLEXERIL) 10 MG tablet TAKE ONE TABLET BY MOUTH AT BEDTIME AS NEEDED FOR MUSCLE SPASM 30 tablet 0  . diclofenac (VOLTAREN) 75 MG EC tablet Take 1 tablet (75 mg total) by mouth 2 (two) times daily. 30 tablet 0  . doxycycline (VIBRA-TABS) 100 MG tablet Take 1 tablet (100 mg total) by mouth 2 (two)  times daily. 20 tablet 0  . fluticasone (FLONASE) 50 MCG/ACT nasal spray Place 2 sprays into the nose daily as needed for rhinitis or allergies. 16 g 6  . furosemide (LASIX) 40 MG tablet Take 0.5 tablets (20 mg total) by mouth daily. 45 tablet 3  . gemfibrozil (LOPID) 600 MG tablet TAKE 1 TABLET TWICE A DAY BEFORE MEALS 180 tablet 1  . HYDROcodone-acetaminophen (NORCO) 5-325 MG per tablet Take 1 tablet by mouth every 6 (six) hours as needed for moderate pain or severe pain. 12 tablet 0  . Insulin Glargine (LANTUS SOLOSTAR) 100 UNIT/ML Solostar Pen Inject 200 Units into the skin daily. And pen needles 3/day 65 pen PRN  . Insulin Pen Needle 31G X 5 MM MISC USE AS DIRECTED ONE TIME DAILY. 150 each 1  . Krill Oil CAPS MegaRed or a generic by schiff daily    . lisinopril (PRINIVIL,ZESTRIL) 40 MG tablet TAKE 1 TABLET DAILY 90 tablet 2  . methocarbamol (ROBAXIN) 500 MG tablet TAKE ONE TABLET BY MOUTH TWICE DAILY AS NEEDED FOR PAIN OR MUSCLE SPASM 60 tablet 0  . Multiple Vitamin (MULTIVITAMIN) tablet Take 1 tablet by mouth daily.      . sertraline (ZOLOFT) 100 MG tablet Take 1.5 tablets (150 mg total) by mouth daily. 135 tablet 1  . triamterene-hydrochlorothiazide (MAXZIDE-25) 37.5-25 MG per tablet Take 1 tablet by mouth daily. 90 tablet 1   No current facility-administered medications on file prior to visit.    BP 138/82 mmHg  Pulse 77  Temp(Src) 97.9 F (36.6 C) (Oral)  Ht  (1.88 m)  Wt 224 lb (101.606 kg)  BMI 28.75 kg/m2  SpO2 96%        Objective:   Physical Exam  General Appearance- Not in acute distress.  HEENT Eyes- Scleraeral/Conjuntiva-bilat- Not Yellow. Mouth & Throat- Normal.  Chest and Lung Exam Auscultation: Breath sounds:-Normal. Adventitious sounds:- No Adventitious sounds.  Cardiovascular Auscultation:Rythm - Regular. Heart Sounds -Normal heart sounds.  Abdomen Inspection:-Inspection Normal.  Palpation/Perucssion: Palpation and Percussion of the abdomen  reveal- Non Tender, No Rebound tenderness, No rigidity(Guarding) and No Palpable abdominal masses.  Liver:-Normal.  Spleen:- Normal.    Back- no cva pain        Assessment & Plan:  Your hiccups have resolved now. Hx of hiatal hernia with rare heatburn.   Will rx omeprazole and reglan if your hiccups return. If so let us know and may need work up depending on how you are/if not responding to tx.  If constipation returns  can use dulcolax and hydrate well. If abdomen pain with constipation let us know may do imaging studies.  Follow up in 7 days or as needed.

## 2015-09-04 ENCOUNTER — Encounter: Payer: Self-pay | Admitting: Family Medicine

## 2015-09-04 ENCOUNTER — Ambulatory Visit (INDEPENDENT_AMBULATORY_CARE_PROVIDER_SITE_OTHER): Payer: Commercial Managed Care - PPO | Admitting: Family Medicine

## 2015-09-04 VITALS — BP 122/84 | HR 78 | Temp 98.1°F | Ht 74.0 in | Wt 260.0 lb

## 2015-09-04 DIAGNOSIS — R140 Abdominal distension (gaseous): Secondary | ICD-10-CM

## 2015-09-04 DIAGNOSIS — E785 Hyperlipidemia, unspecified: Secondary | ICD-10-CM

## 2015-09-04 DIAGNOSIS — E669 Obesity, unspecified: Secondary | ICD-10-CM

## 2015-09-04 DIAGNOSIS — I10 Essential (primary) hypertension: Secondary | ICD-10-CM | POA: Diagnosis not present

## 2015-09-04 DIAGNOSIS — R1084 Generalized abdominal pain: Secondary | ICD-10-CM

## 2015-09-04 DIAGNOSIS — E1122 Type 2 diabetes mellitus with diabetic chronic kidney disease: Secondary | ICD-10-CM

## 2015-09-04 DIAGNOSIS — Z720 Tobacco use: Secondary | ICD-10-CM | POA: Diagnosis not present

## 2015-09-04 DIAGNOSIS — Z23 Encounter for immunization: Secondary | ICD-10-CM | POA: Diagnosis not present

## 2015-09-04 LAB — COMPREHENSIVE METABOLIC PANEL
ALBUMIN: 4.4 g/dL (ref 3.5–5.2)
ALK PHOS: 83 U/L (ref 39–117)
ALT: 25 U/L (ref 0–53)
AST: 19 U/L (ref 0–37)
BILIRUBIN TOTAL: 0.3 mg/dL (ref 0.2–1.2)
BUN: 26 mg/dL — AB (ref 6–23)
CO2: 30 mEq/L (ref 19–32)
Calcium: 10.2 mg/dL (ref 8.4–10.5)
Chloride: 96 mEq/L (ref 96–112)
Creatinine, Ser: 1.24 mg/dL (ref 0.40–1.50)
GFR: 62.66 mL/min (ref >60.00)
GLUCOSE: 235 mg/dL — AB (ref 70–99)
Potassium: 4.8 mEq/L (ref 3.5–5.1)
Sodium: 134 mEq/L — ABNORMAL LOW (ref 135–145)
TOTAL PROTEIN: 7.2 g/dL (ref 6.0–8.3)

## 2015-09-04 LAB — LIPID PANEL
Cholesterol: 128 mg/dL (ref 0–200)
HDL: 26.6 mg/dL — AB (ref >39.00)
NONHDL: 101.77
TRIGLYCERIDES: 304 mg/dL — AB (ref 0.0–149.0)
Total CHOL/HDL Ratio: 5
VLDL: 60.8 mg/dL — ABNORMAL HIGH (ref 0.0–40.0)

## 2015-09-04 LAB — CBC
HEMATOCRIT: 48.1 % (ref 39.0–52.0)
Hemoglobin: 16.2 g/dL (ref 13.0–17.0)
MCHC: 33.8 g/dL (ref 30.0–36.0)
MCV: 92.6 fl (ref 78.0–100.0)
Platelets: 213 10*3/uL (ref 150.0–400.0)
RBC: 5.19 Mil/uL (ref 4.22–5.81)
RDW: 13.4 % (ref 11.5–15.5)
WBC: 12.3 10*3/uL — AB (ref 4.0–10.5)

## 2015-09-04 LAB — LDL CHOLESTEROL, DIRECT: LDL DIRECT: 66 mg/dL

## 2015-09-04 LAB — HEMOGLOBIN A1C: HEMOGLOBIN A1C: 10.1 % — AB (ref 4.6–6.5)

## 2015-09-04 LAB — TSH: TSH: 0.94 u[IU]/mL (ref 0.35–4.50)

## 2015-09-04 NOTE — Progress Notes (Signed)
Pre visit review using our clinic review tool, if applicable. No additional management support is needed unless otherwise documented below in the visit note. 

## 2015-09-04 NOTE — Assessment & Plan Note (Signed)
Encouraged complete cessation. Discussed need to quit as relates to risk of numerous cancers, cardiac and pulmonary disease as well as neurologic complications. Counseled for greater than 3 minutes 

## 2015-09-04 NOTE — Patient Instructions (Addendum)
NOW probiotic 10 strain cap daily, [employer] or [facility] daily    Basic Carbohydrate Counting for Diabetes Mellitus Carbohydrate counting is a method for keeping track of the amount of carbohydrates you eat. Eating carbohydrates naturally increases the level of sugar (glucose) in your blood, so it is important for you to know the amount that is okay for you to have in every meal. Carbohydrate counting helps keep the level of glucose in your blood within normal limits. The amount of carbohydrates allowed is different for every person. A dietitian can help you calculate the amount that is right for you. Once you know the amount of carbohydrates you can have, you can count the carbohydrates in the foods you want to eat. Carbohydrates are found in the following foods:  Grains, such as breads and cereals.  Dried beans and soy products.  Starchy vegetables, such as potatoes, peas, and corn.  Fruit and fruit juices.  Milk and yogurt.  Sweets and snack foods, such as cake, cookies, candy, chips, soft drinks, and fruit drinks. CARBOHYDRATE COUNTING There are two ways to count the carbohydrates in your food. You can use either of the methods or a combination of both. Reading the "Nutrition Facts" on Packaged Food The "Nutrition Facts" is an area that is included on the labels of almost all packaged food and beverages in the [place]. It includes the serving size of that food or beverage and information about the nutrients in each serving of the food, including the grams (g) of carbohydrate per serving.  Decide the number of servings of this food or beverage that you will be able to eat or drink. Multiply that number of servings by the number of grams of carbohydrate that is listed on the label for that serving. The total will be the amount of carbohydrates you will be having when you eat or drink this food or beverage. Learning Standard Serving Sizes of Food When you eat  food that is not packaged or does not include "Nutrition Facts" on the label, you need to measure the servings in order to count the amount of carbohydrates.A serving of most carbohydrate-rich foods contains about 15 g of carbohydrates. The following list includes serving sizes of carbohydrate-rich foods that provide 15 g ofcarbohydrate per serving:   1 slice of bread (1 oz) or 1 six-inch tortilla.    of a hamburger bun or English muffin.  4-6 crackers.   cup unsweetened dry cereal.    cup hot cereal.   cup rice or pasta.    cup mashed potatoes or  of a large baked potato.  1 cup fresh fruit or one small piece of fruit.    cup canned or frozen fruit or fruit juice.  1 cup milk.   cup plain fat-free yogurt or yogurt sweetened with artificial sweeteners.   cup cooked dried beans or starchy vegetable, such as peas, corn, or potatoes.  Decide the number of standard-size servings that you will eat. Multiply that number of servings by 15 (the grams of carbohydrates in that serving). For example, if you eat 2 cups of strawberries, you will have eaten 2 servings and 30 g of carbohydrates (2 servings x 15 g = 30 g). For foods such as soups and casseroles, in which more than one food is mixed in, you will need to count the carbohydrates in each food that is included. EXAMPLE OF CARBOHYDRATE COUNTING Sample Dinner  3 oz chicken breast.   cup of brown  rice.   cup of corn.  1 cup milk.   1 cup strawberries with sugar-free whipped topping.  Carbohydrate Calculation Step 1: Identify the foods that contain carbohydrates:   Rice.   Corn.   Milk.   Strawberries. Step 2:Calculate the number of servings eaten of each:   2 servings of rice.   1 serving of corn.   1 serving of milk.   1 serving of strawberries. Step 3: Multiply each of those number of servings by 15 g:   2 servings of rice x 15 g = 30 g.   1 serving of corn x 15 g = 15 g.   1  serving of milk x 15 g = 15 g.   1 serving of strawberries x 15 g = 15 g. Step 4: Add together all of the amounts to find the total grams of carbohydrates eaten: 30 g + 15 g + 15 g + 15 g = 75 g.   This information is not intended to replace advice given to you by your health care provider. Make sure you discuss any questions you have with your health care provider.   Document Released: 10/04/2005 Document Revised: 10/25/2014 Document Reviewed: 08/31/2013 Elsevier Interactive Patient Education [employer].

## 2015-09-12 ENCOUNTER — Encounter: Payer: Self-pay | Admitting: Family Medicine

## 2015-09-12 DIAGNOSIS — R140 Abdominal distension (gaseous): Secondary | ICD-10-CM | POA: Insufficient documentation

## 2015-09-12 HISTORY — DX: Abdominal distension (gaseous): R14.0

## 2015-09-12 NOTE — Assessment & Plan Note (Signed)
Tolerating statin, encouraged heart healthy diet, avoid trans fats, minimize simple carbs and saturated fats. Increase exercise as tolerated 

## 2015-09-12 NOTE — Progress Notes (Signed)
Subjective:    Patient ID: Gabriel [name], male    DOB: 15-Nov-1950, 64 y.o.   MRN: [id number]  Chief Complaint  Patient presents with  . Follow-up    HPI Patient is in today for follow-up. Sugars are slowly improving. He reports morning sugars between 135-188 and some sugars over 200 after eating. Denies polyuria or polydipsia. Trying to maintain a diabetic diet. Does note some recent abdominal bloating but denies any change in bowel habits, fevers, chills, nausea or vomiting. Denies CP/palp/SOB/HA/congestion/fevers/GI or GU c/o. Taking meds as prescribed  Past Medical History  Diagnosis Date  . Arthritis   . Depression   . History of chronic bronchitis     dx 2011  . Seasonal allergies   . Aortic stenosis   . Hypertension   . Diabetes mellitus, type 2 (HCC)   . Hyperlipidemia   . History of stroke 1992    blood clot at base of brain-[facility]  . Congestive heart failure (HCC)     September 2011-CHF  . History of chicken pox   . Obesity, unspecified 11/27/2012  . Tobacco abuse 11/27/2012    Down from 3 ppd to 1/2 ppd  . Allergic state 02/08/2013  . Acute low back pain with radicular symptoms, duration less than 6 weeks 05/16/2013  . Viral URI 05/26/2014  . Unspecified hereditary and idiopathic peripheral neuropathy 05/26/2014    Tingling in toes  . Preventative health care 03/08/2015  . Abdominal bloating 09/12/2015    Past Surgical History  Procedure Laterality Date  . Knee surgery  1976, 1989    right knee torn ligament repair  . Lumbar fusion  1982    L4-L5  . Revision total knee arthroplasty  2007    right knee replacement    Family History  Problem Relation Age of Onset  . Arthritis      mother/father/paternal grandparents  . Heart disease Father     pacemaker  . Hypertension Father   . Hypertension Paternal Grandfather   . Hypertension Brother   . Emotional abuse Mother   . Diabetes Brother      x 2  . Diabetes Paternal Grandmother     Social  History   Social History  . Marital Status: Widowed    Spouse Name: N/A  . Number of Children: 2  . 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 Smoking status: Current Every Day Smoker -- 1.00 packs/day  . Smokeless tobacco: Never Used     Comment: smokes a pack per day-started in 1972, trying to stop smokes 10 per day.  . Alcohol Use: No  . Drug Use: No  . Sexual Activity: No   Other Topics Concern  . Not on file   Social History Narrative   Regular exercise-no   Caffeine Use-yes          Outpatient Prescriptions Prior to Visit  Medication Sig Dispense Refill  . albuterol (PROVENTIL HFA;VENTOLIN HFA) 108 (90 BASE) MCG/ACT inhaler Inhale 2 puffs into the lungs every 6 (six) hours as needed for wheezing. 1 Inhaler 1  . amLODipine (NORVASC) 10 MG tablet Take 1 tablet (10 mg total) by mouth daily. 90 tablet 1  . aspirin 81 MG tablet Take 81 mg by mouth daily.      Marland Kitchen atorvastatin (LIPITOR) 40 MG tablet Take 1 tablet (40 mg total) by mouth daily. 90 tablet 2  .  carvedilol (COREG) 25 MG tablet TAKE ONE AND ONE-HALF TABLETS TWICE A DAY 270 tablet 2  . cyclobenzaprine (FLEXERIL) 10 MG tablet TAKE ONE TABLET BY MOUTH AT BEDTIME AS NEEDED FOR MUSCLE SPASM 30 tablet 0  . diclofenac (VOLTAREN) 75 MG EC tablet Take 1 tablet (75 mg total) by mouth 2 (two) times daily. 30 tablet 0  . fluticasone (FLONASE) 50 MCG/ACT nasal spray Place 2 sprays into the nose daily as needed for rhinitis or allergies. 16 g 6  . furosemide (LASIX) 40 MG tablet Take 0.5 tablets (20 mg total) by mouth daily. 45 tablet 3  . gemfibrozil (LOPID) 600 MG tablet TAKE 1 TABLET TWICE A DAY BEFORE MEALS 180 tablet 1  . HYDROcodone-acetaminophen (NORCO) 5-325 MG per tablet Take 1 tablet by mouth every 6 (six) hours as needed for moderate pain or severe pain. 12 tablet 0  . Insulin Glargine (LANTUS SOLOSTAR) 100 UNIT/ML Solostar Pen Inject 200 Units into the  skin daily. And pen needles 3/day 65 pen PRN  . Insulin Pen Needle 31G X 5 MM MISC USE AS DIRECTED ONE TIME DAILY. 150 each 1  . Krill Oil CAPS MegaRed or a generic by schiff daily    . lisinopril (PRINIVIL,ZESTRIL) 40 MG tablet TAKE 1 TABLET DAILY 90 tablet 2  . methocarbamol (ROBAXIN) 500 MG tablet TAKE ONE TABLET BY MOUTH TWICE DAILY AS NEEDED FOR PAIN OR MUSCLE SPASM 60 tablet 0  . metoCLOPramide (REGLAN) 10 MG tablet Take 1 tablet (10 mg total) by mouth 3 (three) times daily before meals. 30 tablet 0  . Multiple Vitamin (MULTIVITAMIN) tablet Take 1 tablet by mouth daily.      Marland Kitchen omeprazole (PRILOSEC) 20 MG capsule Take 1 capsule (20 mg total) by mouth daily. 30 capsule 3  . sertraline (ZOLOFT) 100 MG tablet Take 1.5 tablets (150 mg total) by mouth daily. 135 tablet 1  . triamterene-hydrochlorothiazide (MAXZIDE-25) 37.5-25 MG per tablet Take 1 tablet by mouth daily. 90 tablet 1  . doxycycline (VIBRA-TABS) 100 MG tablet Take 1 tablet (100 mg total) by mouth 2 (two) times daily. 20 tablet 0   No facility-administered medications prior to visit.    No Known Allergies  Review of Systems  Constitutional: Negative for fever and malaise/fatigue.  HENT: Negative for congestion.   Eyes: Negative for discharge.  Respiratory: Negative for shortness of breath.   Cardiovascular: Negative for chest pain, palpitations and leg swelling.  Gastrointestinal: Positive for heartburn. Negative for nausea and abdominal pain.  Genitourinary: Negative for dysuria.  Musculoskeletal: Negative for falls.  Skin: Negative for rash.  Neurological: Negative for loss of consciousness and headaches.  Endo/Heme/Allergies: Negative for environmental allergies.  Psychiatric/Behavioral: Negative for depression. The patient is not nervous/anxious.        Objective:    Physical Exam  Constitutional: He is oriented to person, place, and time. He appears well-developed and well-nourished. No distress.  HENT:  Head:  Normocephalic and atraumatic.  Nose: Nose normal.  Eyes: Right eye exhibits no discharge. Left eye exhibits no discharge.  Neck: Normal range of motion. Neck supple.  Cardiovascular: Normal rate and regular rhythm.   No murmur heard. Pulmonary/Chest: Effort normal and breath sounds normal.  Abdominal: Soft. Bowel sounds are normal. There is no tenderness.  Musculoskeletal: He exhibits no edema.  Neurological: He is alert and oriented to person, place, and time.  Skin: Skin is warm and dry.  Psychiatric: He has a normal mood and affect.  Nursing note and vitals reviewed.  BP 122/84 mmHg  Pulse 78  Temp(Src) 98.1 F (36.7 C) (Oral)  Ht  (1.88 m)  Wt 260 lb (117.935 kg)  BMI 33.37 kg/m2  SpO2 94% Wt Readings from Last 3 Encounters:  09/04/15 260 lb (117.935 kg)  08/27/15 224 lb (101.606 kg)  06/11/15 250 lb (113.399 kg)     Lab Results  Component Value Date   WBC 12.3* 09/04/2015   HGB 16.2 09/04/2015   HCT 48.1 09/04/2015   PLT 213.0 09/04/2015   GLUCOSE 235* 09/04/2015   CHOL 128 09/04/2015   TRIG 304.0* 09/04/2015   HDL 26.60* 09/04/2015   LDLDIRECT 66.0 09/04/2015   LDLCALC NOT CALC 05/15/2014   ALT 25 09/04/2015   AST 19 09/04/2015   NA 134* 09/04/2015   K 4.8 09/04/2015   CL 96 09/04/2015   CREATININE 1.24 09/04/2015   BUN 26* 09/04/2015   CO2 30 09/04/2015   TSH 0.94 09/04/2015   PSA 0.35 03/09/2012   HGBA1C 10.1* 09/04/2015   MICROALBUR 9.4* 02/24/2015    Lab Results  Component Value Date   TSH 0.94 09/04/2015   Lab Results  Component Value Date   WBC 12.3* 09/04/2015   HGB 16.2 09/04/2015   HCT 48.1 09/04/2015   MCV 92.6 09/04/2015   PLT 213.0 09/04/2015   Lab Results  Component Value Date   NA 134* 09/04/2015   K 4.8 09/04/2015   CO2 30 09/04/2015   GLUCOSE 235* 09/04/2015   BUN 26* 09/04/2015   CREATININE 1.24 09/04/2015   BILITOT 0.3 09/04/2015   ALKPHOS 83 09/04/2015   AST 19 09/04/2015   ALT 25 09/04/2015   PROT 7.2  09/04/2015   ALBUMIN 4.4 09/04/2015   CALCIUM 10.2 09/04/2015   GFR 62.66 09/04/2015   Lab Results  Component Value Date   CHOL 128 09/04/2015   Lab Results  Component Value Date   HDL 26.60* 09/04/2015   Lab Results  Component Value Date   LDLCALC NOT CALC 05/15/2014   Lab Results  Component Value Date   TRIG 304.0* 09/04/2015   Lab Results  Component Value Date   CHOLHDL 5 09/04/2015   Lab Results  Component Value Date   HGBA1C 10.1* 09/04/2015       Assessment & Plan:   Problem List Items Addressed This Visit    Abdominal bloating    Encouraged probiotics and fiber supplements. Minimize simple carbs      Diabetes (HCC)    hgba1c improving but still too high, is following with endocrinology now. Minimize simple carbs and continue current meds      Relevant Orders   CBC (Completed)   TSH (Completed)   Hemoglobin A1c (Completed)   Lipid panel (Completed)   Comprehensive metabolic panel (Completed)   Stool Culture   Stool, WBC/Lactoferrin   HTN (hypertension)    Well controlled, no changes to meds. Encouraged heart healthy diet such as the DASH diet and exercise as tolerated.       Relevant Orders   CBC (Completed)   TSH (Completed)   Hemoglobin A1c (Completed)   Lipid panel (Completed)   Comprehensive metabolic panel (Completed)   Stool Culture   Stool, WBC/Lactoferrin   Hyperlipidemia    Tolerating statin, encouraged heart healthy diet, avoid trans fats, minimize simple carbs and saturated fats. Increase exercise as tolerated      Relevant Orders   CBC (Completed)   TSH (Completed)   Hemoglobin A1c (Completed)   Lipid panel (Completed)  Comprehensive metabolic panel (Completed)   Stool Culture   Stool, WBC/Lactoferrin   Obesity    Encouraged DASH diet, decrease po intake and increase exercise as tolerated. Needs 7-8 hours of sleep nightly. Avoid trans fats, eat small, frequent meals every 4-5 hours with lean proteins, complex carbs and  healthy fats. Minimize simple carbs, GMO foods.      Tobacco abuse    Encouraged complete cessation. Discussed need to quit as relates to risk of numerous cancers, cardiac and pulmonary disease as well as neurologic complications. Counseled for greater than 3 minutes      Relevant Orders   CBC (Completed)   TSH (Completed)   Hemoglobin A1c (Completed)   Lipid panel (Completed)   Comprehensive metabolic panel (Completed)   Stool Culture   Stool, WBC/Lactoferrin    Other Visit Diagnoses    Encounter for immunization    -  Primary    Generalized abdominal pain        Relevant Orders    CBC (Completed)    TSH (Completed)    Hemoglobin A1c (Completed)    Lipid panel (Completed)    Comprehensive metabolic panel (Completed)    Stool Culture    Stool, WBC/Lactoferrin    Need for 23-polyvalent pneumococcal polysaccharide vaccine        Relevant Orders    Pneumococcal polysaccharide vaccine 23-valent greater than or equal to 2yo subcutaneous/IM (Completed)       I have discontinued Mr. Lafonda MossesStanley's doxycycline. I am also having him maintain his multivitamin, aspirin, Krill Oil, fluticasone, Insulin Pen Needle, diclofenac, HYDROcodone-acetaminophen, triamterene-hydrochlorothiazide, sertraline, amLODipine, gemfibrozil, furosemide, carvedilol, atorvastatin, lisinopril, albuterol, Insulin Glargine, cyclobenzaprine, methocarbamol, omeprazole, and metoCLOPramide.  No orders of the defined types were placed in this encounter.     Danise EdgeBLYTH, STACEY, MD

## 2015-09-12 NOTE — Assessment & Plan Note (Signed)
hgba1c improving but still too high, is following with endocrinology now. Minimize simple carbs and continue current meds

## 2015-09-12 NOTE — Assessment & Plan Note (Signed)
Encouraged DASH diet, decrease po intake and increase exercise as tolerated. Needs 7-8 hours of sleep nightly. Avoid trans fats, eat small, frequent meals every 4-5 hours with lean proteins, complex carbs and healthy fats. Minimize simple carbs, GMO foods. 

## 2015-09-12 NOTE — Assessment & Plan Note (Signed)
Well controlled, no changes to meds. Encouraged heart healthy diet such as the DASH diet and exercise as tolerated.  °

## 2015-09-12 NOTE — Assessment & Plan Note (Signed)
Encouraged probiotics and fiber supplements. Minimize simple carbs

## 2015-09-24 ENCOUNTER — Ambulatory Visit: Payer: Self-pay | Admitting: Endocrinology

## 2015-10-01 ENCOUNTER — Ambulatory Visit: Payer: Self-pay | Admitting: Endocrinology

## 2015-10-01 ENCOUNTER — Telehealth: Payer: Self-pay | Admitting: Endocrinology

## 2015-10-01 ENCOUNTER — Ambulatory Visit (INDEPENDENT_AMBULATORY_CARE_PROVIDER_SITE_OTHER): Payer: Commercial Managed Care - PPO | Admitting: Medical

## 2015-10-01 ENCOUNTER — Encounter: Payer: Self-pay | Admitting: Medical

## 2015-10-01 VITALS — BP 118/72 | HR 93 | Temp 98.6°F | Ht 74.0 in | Wt 253.6 lb

## 2015-10-01 DIAGNOSIS — R944 Abnormal results of kidney function studies: Secondary | ICD-10-CM

## 2015-10-01 DIAGNOSIS — M545 Low back pain, unspecified: Secondary | ICD-10-CM

## 2015-10-01 DIAGNOSIS — Z0289 Encounter for other administrative examinations: Secondary | ICD-10-CM

## 2015-10-01 MED ORDER — HYDROCODONE-ACETAMINOPHEN 5-325 MG PO TABS: 1.0000 | ORAL_TABLET | Freq: Four times a day (QID) | ORAL | Status: DC | PRN

## 2015-10-01 MED ORDER — CYCLOBENZAPRINE HCL 10 MG PO TABS: ORAL_TABLET | ORAL | Status: DC

## 2015-10-01 NOTE — Telephone Encounter (Signed)
Noted  

## 2015-10-01 NOTE — Progress Notes (Signed)
Subjective:    Patient ID: Gabriel [name], male    DOB: [birth year], 64 y.o.   MRN: [id number]  HPI  Pt in states his lower back hurts. He states about 3 times a year hurts his back. He was picking up bag full of presents. Bag was full of presents. He pulled his back last night. Pain is 8-9/10. Pt tool extra strength tylenol.   No pain shooting down his legs. In past some leg numb feeling with back pain. No saddle anesthesia. No problems passing stools or on urination.   Pt had intermittent since 1980's with back. Mri in 2014 showed some stenosis, and some disc buldging. Hx of surgery in his back in 25's.   Review of Systems  Constitutional: Negative for fever, chills and fatigue.  Respiratory: Negative for cough, chest tightness and wheezing.   Cardiovascular: Negative for chest pain and palpitations.  Gastrointestinal: Negative for nausea, vomiting and abdominal pain.  Genitourinary: Negative for dysuria, urgency, hematuria and flank pain.  Musculoskeletal: Positive for back pain.  Neurological: Negative for weakness and numbness.       Some mild numb feeling to left leg down to foot    Past Medical History  Diagnosis Date  . Arthritis   . Depression   . History of chronic bronchitis     dx 2011  . Seasonal allergies   . Aortic stenosis   . Hypertension   . Diabetes mellitus, type 2 (HCC)   . Hyperlipidemia   . History of stroke 1992    blood clot at base of brain-[facility]  . Congestive heart failure (HCC)     September 2011-CHF  . History of chicken pox   . Obesity, unspecified 11/27/2012  . Tobacco abuse 11/27/2012    Down from 3 ppd to 1/2 ppd  . Allergic state 02/08/2013  . Acute low back pain with radicular symptoms, duration less than 6 weeks 05/16/2013  . Viral URI 05/26/2014  . Unspecified hereditary and idiopathic peripheral neuropathy 05/26/2014    Tingling in toes  . Preventative health care 03/08/2015  . Abdominal bloating 09/12/2015    Social  History   Social History  . Marital Status: Widowed    Spouse Name: N/A  . Number of Children: 2  . 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 Smoking status: Current Every Day Smoker -- 1.00 packs/day  . Smokeless tobacco: Never Used     Comment: smokes a pack per day-started in 1972, trying to stop smokes 10 per day.  . Alcohol Use: No  . Drug Use: No  . Sexual Activity: No   Other Topics Concern  . Not on file   Social History Narrative   Regular exercise-no   Caffeine Use-yes          Past Surgical History  Procedure Laterality Date  . Knee surgery  1976, 1989    right knee torn ligament repair  . Lumbar fusion  1982    L4-L5  . Revision total knee arthroplasty  2007    right knee replacement    Family History  Problem Relation Age of Onset  . Arthritis      mother/father/paternal grandparents  . Heart disease Father     pacemaker  . Hypertension Father   . Hypertension Paternal Grandfather   . Hypertension Brother   . Emotional abuse Mother   . Diabetes Brother  x 2  . Diabetes Paternal Grandmother     No Known Allergies  Current Outpatient Prescriptions on File Prior to Visit  Medication Sig Dispense Refill  . albuterol (PROVENTIL HFA;VENTOLIN HFA) 108 (90 BASE) MCG/ACT inhaler Inhale 2 puffs into the lungs every 6 (six) hours as needed for wheezing. 1 Inhaler 1  . amLODipine (NORVASC) 10 MG tablet Take 1 tablet (10 mg total) by mouth daily. 90 tablet 1  . aspirin 81 MG tablet Take 81 mg by mouth daily.      Marland Kitchen. atorvastatin (LIPITOR) 40 MG tablet Take 1 tablet (40 mg total) by mouth daily. 90 tablet 2  . carvedilol (COREG) 25 MG tablet TAKE ONE AND ONE-HALF TABLETS TWICE A DAY 270 tablet 2  . diclofenac (VOLTAREN) 75 MG EC tablet Take 1 tablet (75 mg total) by mouth 2 (two) times daily. 30 tablet 0  . fluticasone (FLONASE) 50 MCG/ACT nasal spray Place 2 sprays into the nose daily  as needed for rhinitis or allergies. 16 g 6  . furosemide (LASIX) 40 MG tablet Take 0.5 tablets (20 mg total) by mouth daily. 45 tablet 3  . gemfibrozil (LOPID) 600 MG tablet TAKE 1 TABLET TWICE A DAY BEFORE MEALS 180 tablet 1  . Insulin Glargine (LANTUS SOLOSTAR) 100 UNIT/ML Solostar Pen Inject 200 Units into the skin daily. And pen needles 3/day 65 pen PRN  . Insulin Pen Needle 31G X 5 MM MISC USE AS DIRECTED ONE TIME DAILY. 150 each 1  . Krill Oil CAPS MegaRed or a generic by schiff daily    . lisinopril (PRINIVIL,ZESTRIL) 40 MG tablet TAKE 1 TABLET DAILY 90 tablet 2  . methocarbamol (ROBAXIN) 500 MG tablet TAKE ONE TABLET BY MOUTH TWICE DAILY AS NEEDED FOR PAIN OR MUSCLE SPASM 60 tablet 0  . metoCLOPramide (REGLAN) 10 MG tablet Take 1 tablet (10 mg total) by mouth 3 (three) times daily before meals. 30 tablet 0  . Multiple Vitamin (MULTIVITAMIN) tablet Take 1 tablet by mouth daily.      Marland Kitchen. omeprazole (PRILOSEC) 20 MG capsule Take 1 capsule (20 mg total) by mouth daily. 30 capsule 3  . sertraline (ZOLOFT) 100 MG tablet Take 1.5 tablets (150 mg total) by mouth daily. 135 tablet 1  . triamterene-hydrochlorothiazide (MAXZIDE-25) 37.5-25 MG per tablet Take 1 tablet by mouth daily. 90 tablet 1   No current facility-administered medications on file prior to visit.    BP 118/72 mmHg  Pulse 93  Temp(Src) 98.6 F (37 C) (Oral)  Ht 6\' 2"  (1.88 m)  Wt 253 lb 9.6 oz (115.032 kg)  BMI 32.55 kg/m2  SpO2 96%       Objective:   Physical Exam  General Appearance- Not in acute distress.    Chest and Lung Exam Auscultation: Breath sounds:-Normal. Clear even and unlabored. Adventitious sounds:- No Adventitious sounds.  Cardiovascular Auscultation:Rythm - Regular, rate and rythm. Heart Sounds -Normal heart sounds.  Abdomen Inspection:-Inspection Normal.  Palpation/Perucssion: Palpation and Percussion of the abdomen reveal- Non Tender, No Rebound tenderness, No rigidity(Guarding) and No  Palpable abdominal masses.  Liver:-Normal.  Spleen:- Normal.   Back Mid lumbar spine tenderness to palpation. Pain on straight leg lift. Pain on lateral movements and flexion/extension of the spine.  Lower ext neurologic  L5-S1 sensation intact bilaterally. Normal patellar reflexes bilaterally. No foot drop bilaterally.      Assessment & Plan:  For your back pain I want you rest and do stretching exercises. Will rx norco pain med and cyclobenzaprine.  I want you to get cmp today. You may need nsaid but want to check your kidney function first since gfr was decreased earlier in year.  Explained signs or symptoms that would necessitate ED evaluation.  Follow up in 10-14 days or as needed

## 2015-10-01 NOTE — Telephone Encounter (Signed)
Patient Name: Gabriel [name] Gender: Male DOB: 03/11/1951 Age: [age] Y 7 M 27 D Return Phone Number: [phone] (Primary) Address: City/State/Zip: [place] Client [facility] Night - Client Client Site [facility] Physician Romero BellingEllison, Sean Contact Type Call Caller Name Gabriel [name] Caller Phone Number [phone] Relationship To Patient Self Is this call to report lab results? No Call Type General Information Initial Comment Caller states that he has an appointment at 8:00a. that he needs to cancel due to still being at work. He would like a call back after 10:00a. to reschedule. Call back # is [phone]. General Information Type Message Only

## 2015-10-01 NOTE — Progress Notes (Signed)
Pre visit review using our clinic review tool, if applicable. No additional management support is needed unless otherwise documented below in the visit note. 

## 2015-10-01 NOTE — Patient Instructions (Addendum)
For your back pain I want you rest and do stretching exercises. Will rx norco pain med and cyclobenzaprine.  I want you to get cmp today. You may need nsaid but want to check your kidney function first since gfr was decreased earlier in year.  Explained signs or symptoms that would necessitate ED evaluation.  Follow up in 10-14 days or as needed

## 2015-10-03 ENCOUNTER — Telehealth: Payer: Self-pay | Admitting: Endocrinology

## 2015-10-03 NOTE — Telephone Encounter (Signed)
Patient no showed today's appt. Please advise on how to follow up. °A. No follow up necessary. °B. Follow up urgent. Contact patient immediately. °C. Follow up necessary. Contact patient and schedule visit in ___ days. °D. Follow up advised. Contact patient and schedule visit in ____weeks. ° °

## 2015-10-05 NOTE — Telephone Encounter (Signed)
Follow up advised. Contact patient and schedule visit in 6 weeks. 

## 2015-10-06 NOTE — Telephone Encounter (Signed)
No show letter mailed to the pt.  

## 2015-10-07 ENCOUNTER — Ambulatory Visit: Payer: Commercial Managed Care - PPO

## 2015-10-22 ENCOUNTER — Other Ambulatory Visit: Payer: Self-pay | Admitting: Family Medicine

## 2015-10-22 ENCOUNTER — Ambulatory Visit (INDEPENDENT_AMBULATORY_CARE_PROVIDER_SITE_OTHER): Payer: Commercial Managed Care - PPO | Admitting: Endocrinology

## 2015-10-22 ENCOUNTER — Encounter: Payer: Self-pay | Admitting: Endocrinology

## 2015-10-22 VITALS — BP 128/83 | HR 84 | Temp 97.8°F | Ht 74.0 in | Wt 254.0 lb

## 2015-10-22 DIAGNOSIS — E1122 Type 2 diabetes mellitus with diabetic chronic kidney disease: Secondary | ICD-10-CM

## 2015-10-22 MED ORDER — FUROSEMIDE 40 MG PO TABS: 20.0000 mg | ORAL_TABLET | Freq: Every day | ORAL | Status: DC

## 2015-10-22 NOTE — Patient Instructions (Addendum)
check your blood sugar twice a day.  vary the time of day when you check, between before the 3 meals, and at bedtime.  also check if you have symptoms of your blood sugar being too high or too low.  please keep a record of the readings and bring it to your next appointment here.  You can write it on any piece of paper.  please call us sooner if your blood sugar goes below 70, or if you have a lot of readings over 200.  A different type of diabetes blood test is requested for you today.  We'll let you know about the results. Please come back for a follow-up appointment in 2 months.   On this type of insulin schedule, you should eat meals on a regular schedule.  If a meal is missed or significantly delayed, your blood sugar could go low. If necessary, carry a non-perishable snack, such as crackers.

## 2015-10-22 NOTE — Progress Notes (Signed)
Subjective:    Patient ID: Gabriel [name], male    DOB: 01/20/1951, 65 y.o.   MRN: [id number]  HPI Pt returns for f/u of diabetes mellitus: DM type: Insulin-requiring type 2 Dx'ed: 1994 Complications: CVA, polyneuropathy, and renal insufficiency Therapy: insulin since 2000 DKA: never Severe hypoglycemia: never. Pancreatitis: never   Other: he works 3rd shift. Interval history: He was changed to lantus alone, due to poor results with multiple daily injections.  He says he never misses the insulin injections.  no cbg record, but states cbg's vary from 85-200.  It is lowest when he misses a meal, or is active at work (3rd shift).    Past Medical History  Diagnosis Date  . Arthritis   . Depression   . History of chronic bronchitis     dx 2011  . Seasonal allergies   . Aortic stenosis   . Hypertension   . Diabetes mellitus, type 2 (HCC)   . Hyperlipidemia   . History of stroke 1992    blood clot at base of brain-[facility]  . Congestive heart failure (HCC)     September 2011-CHF  . History of chicken pox   . Obesity, unspecified 11/27/2012  . Tobacco abuse 11/27/2012    Down from 3 ppd to 1/2 ppd  . Allergic state 02/08/2013  . Acute low back pain with radicular symptoms, duration less than 6 weeks 05/16/2013  . Viral URI 05/26/2014  . Unspecified hereditary and idiopathic peripheral neuropathy 05/26/2014    Tingling in toes  . Preventative health care 03/08/2015  . Abdominal bloating 09/12/2015    Past Surgical History  Procedure Laterality Date  . Knee surgery  1976, 1989    right knee torn ligament repair  . Lumbar fusion  1982    L4-L5  . Revision total knee arthroplasty  2007    right knee replacement    Social History   Social History  . Marital Status: Widowed    Spouse Name: N/A  . Number of Children: 2  . 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 Smoking status: Current Every Day  Smoker -- 1.00 packs/day  . Smokeless tobacco: Never Used     Comment: smokes a pack per day-started in 1972, trying to stop smokes 10 per day.  . Alcohol Use: No  . Drug Use: No  . Sexual Activity: No   Other Topics Concern  . Not on file   Social History Narrative   Regular exercise-no   Caffeine Use-yes          Current Outpatient Prescriptions on File Prior to Visit  Medication Sig Dispense Refill  . albuterol (PROVENTIL HFA;VENTOLIN HFA) 108 (90 BASE) MCG/ACT inhaler Inhale 2 puffs into the lungs every 6 (six) hours as needed for wheezing. 1 Inhaler 1  . amLODipine (NORVASC) 10 MG tablet Take 1 tablet (10 mg total) by mouth daily. 90 tablet 1  . aspirin 81 MG tablet Take 81 mg by mouth daily.      Marland Kitchen atorvastatin (LIPITOR) 40 MG tablet Take 1 tablet (40 mg total) by mouth daily. 90 tablet 2  . carvedilol (COREG) 25 MG tablet TAKE ONE AND ONE-HALF TABLETS TWICE A DAY 270 tablet 2  . cyclobenzaprine (FLEXERIL) 10 MG tablet TAKE ONE TABLET BY MOUTH AT BEDTIME AS NEEDED FOR MUSCLE SPASM 15 tablet 0  . diclofenac (VOLTAREN) 75 MG EC tablet Take 1  tablet (75 mg total) by mouth 2 (two) times daily. 30 tablet 0  . fluticasone (FLONASE) 50 MCG/ACT nasal spray Place 2 sprays into the nose daily as needed for rhinitis or allergies. 16 g 6  . gemfibrozil (LOPID) 600 MG tablet TAKE 1 TABLET TWICE A DAY BEFORE MEALS 180 tablet 1  . HYDROcodone-acetaminophen (NORCO) 5-325 MG tablet Take 1 tablet by mouth every 6 (six) hours as needed for moderate pain. 30 tablet 0  . Insulin Glargine (LANTUS SOLOSTAR) 100 UNIT/ML Solostar Pen Inject 200 Units into the skin daily. And pen needles 3/day 65 pen PRN  . Insulin Pen Needle 31G X 5 MM MISC USE AS DIRECTED ONE TIME DAILY. 150 each 1  . Krill Oil CAPS MegaRed or a generic by schiff daily    . lisinopril (PRINIVIL,ZESTRIL) 40 MG tablet TAKE 1 TABLET DAILY 90 tablet 2  . methocarbamol (ROBAXIN) 500 MG tablet TAKE ONE TABLET BY MOUTH TWICE DAILY AS NEEDED  FOR PAIN OR MUSCLE SPASM 60 tablet 0  . metoCLOPramide (REGLAN) 10 MG tablet Take 1 tablet (10 mg total) by mouth 3 (three) times daily before meals. 30 tablet 0  . Multiple Vitamin (MULTIVITAMIN) tablet Take 1 tablet by mouth daily.      Marland Kitchen omeprazole (PRILOSEC) 20 MG capsule Take 1 capsule (20 mg total) by mouth daily. 30 capsule 3  . sertraline (ZOLOFT) 100 MG tablet Take 1.5 tablets (150 mg total) by mouth daily. 135 tablet 1  . triamterene-hydrochlorothiazide (MAXZIDE-25) 37.5-25 MG per tablet Take 1 tablet by mouth daily. 90 tablet 1   No current facility-administered medications on file prior to visit.    No Known Allergies  Family History  Problem Relation Age of Onset  . Arthritis      mother/father/paternal grandparents  . Heart disease Father     pacemaker  . Hypertension Father   . Hypertension Paternal Grandfather   . Hypertension Brother   . Emotional abuse Mother   . Diabetes Brother      x 2  . Diabetes Paternal Grandmother     BP 128/83 mmHg  Pulse 84  Temp(Src) 97.8 F (36.6 C) (Oral)  Ht 6\' 2"  (1.88 m)  Wt 254 lb (115.214 kg)  BMI 32.60 kg/m2  SpO2 98%  Review of Systems Denies LOC.      Objective:   Physical Exam VITAL SIGNS:  See vs page GENERAL: no distress Pulses: dorsalis pedis intact bilat.   MSK: no deformity of the feet CV: no leg edema Skin:  no ulcer on the feet.  normal color and temp on the feet. Neuro: sensation is intact to touch on the feet, but decreased from normal.   Lab Results  Component Value Date   HGBA1C 10.1* 09/04/2015      Assessment & Plan:  DM: glycemic control is apparently much better.  We'll check fructosamine.  Patient is advised the following: Patient Instructions  check your blood sugar twice a day.  vary the time of day when you check, between before the 3 meals, and at bedtime.  also check if you have symptoms of your blood sugar being too high or too low.  please keep a record of the readings and bring  it to your next appointment here.  You can write it on any piece of paper.  please call us sooner if your blood sugar goes below 70, or if you have a lot of readings over 200.  A different type of diabetes blood test  is requested for you today.  We'll let you know about the results. Please come back for a follow-up appointment in 2 months.   On this type of insulin schedule, you should eat meals on a regular schedule.  If a meal is missed or significantly delayed, your blood sugar could go low. If necessary, carry a non-perishable snack, such as crackers.

## 2015-10-24 LAB — FRUCTOSAMINE: FRUCTOSAMINE: 458 umol/L — AB (ref 190–270)

## 2015-11-09 ENCOUNTER — Other Ambulatory Visit: Payer: Self-pay | Admitting: Family Medicine

## 2015-11-24 ENCOUNTER — Other Ambulatory Visit: Payer: Self-pay | Admitting: Family Medicine

## 2015-12-08 ENCOUNTER — Ambulatory Visit: Payer: Commercial Managed Care - PPO | Admitting: Family Medicine

## 2015-12-08 ENCOUNTER — Telehealth: Payer: Self-pay | Admitting: Family Medicine

## 2015-12-08 NOTE — Telephone Encounter (Signed)
Pt lvm at 7:38 on 12/08/15 stating that he wasn't feeling well. He will call back to rescheduled.

## 2015-12-09 NOTE — Telephone Encounter (Signed)
No charge. 

## 2015-12-09 NOTE — Telephone Encounter (Signed)
Charge or no charge for no show?

## 2015-12-19 ENCOUNTER — Ambulatory Visit: Payer: Self-pay | Admitting: Endocrinology

## 2015-12-25 ENCOUNTER — Other Ambulatory Visit: Payer: Self-pay | Admitting: Family Medicine

## 2015-12-30 ENCOUNTER — Other Ambulatory Visit: Payer: Self-pay | Admitting: Family Medicine

## 2015-12-30 NOTE — Telephone Encounter (Signed)
Patient informed to schedule appointment to continue getting medications

## 2015-12-30 NOTE — Patient Instructions (Addendum)
check your blood sugar twice a day.  vary the time of day when you check, between before the 3 meals, and at bedtime.  also check if you have symptoms of your blood sugar being too high or too low.  please keep a record of the readings and bring it to your next appointment here.  You can write it on any piece of paper.  please call us sooner if your blood sugar goes below 70, or if you have a lot of readings over 200.  Please come back for a follow-up appointment in 2 months.   On this type of insulin schedule, you should eat meals on a regular schedule.  If a meal is missed or significantly delayed, your blood sugar could go low. If necessary, carry a non-perishable snack, such as crackers.   Please reduce the lantus to 200 units daily, and:  Add novolog 30 units with breakfast.  i have sent a prescription to your pharmacy.

## 2015-12-30 NOTE — Telephone Encounter (Signed)
Notify he needs an appt to keep this med going, no further appt in chart. I have refilled for now

## 2015-12-30 NOTE — Progress Notes (Signed)
Subjective:    Patient ID: Gabriel [name], male    DOB: March 25, 1951, 65 y.o.   MRN: [id number]  HPI Pt returns for f/u of diabetes mellitus: DM type: Insulin-requiring type 2 Dx'ed: 1994 Complications: CVA, polyneuropathy, and renal insufficiency Therapy: insulin since 2000 DKA: never Severe hypoglycemia: never. Pancreatitis: never   Other: he works 3rd shift. Interval history: He was changed to lantus alone, due to poor results with multiple daily injections.  He says he never misses the insulin injections.  no cbg record, but states cbg's vary from 85-200.  It is mildly low when he misses a meal, or is active at work (3rd shift).  It is highest at noon.   Past Medical History  Diagnosis Date  . Arthritis   . Depression   . History of chronic bronchitis     dx 2011  . Seasonal allergies   . Aortic stenosis   . Hypertension   . Diabetes mellitus, type 2 (HCC)   . Hyperlipidemia   . History of stroke 1992    blood clot at base of brain-[facility]  . Congestive heart failure (HCC)     September 2011-CHF  . History of chicken pox   . Obesity, unspecified 11/27/2012  . Tobacco abuse 11/27/2012    Down from 3 ppd to 1/2 ppd  . Allergic state 02/08/2013  . Acute low back pain with radicular symptoms, duration less than 6 weeks 05/16/2013  . Viral URI 05/26/2014  . Unspecified hereditary and idiopathic peripheral neuropathy 05/26/2014    Tingling in toes  . Preventative health care 03/08/2015  . Abdominal bloating 09/12/2015    Past Surgical History  Procedure Laterality Date  . Knee surgery  1976, 1989    right knee torn ligament repair  . Lumbar fusion  1982    L4-L5  . Revision total knee arthroplasty  2007    right knee replacement    Social History   Social History  . Marital Status: Widowed    Spouse Name: N/A  . Number of Children: 2  . 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 Smoking  status: Current Every Day Smoker -- 1.00 packs/day  . Smokeless tobacco: Never Used     Comment: smokes a pack per day-started in 1972, trying to stop smokes 10 per day.  . Alcohol Use: No  . Drug Use: No  . Sexual Activity: No   Other Topics Concern  . Not on file   Social History Narrative   Regular exercise-no   Caffeine Use-yes          Current Outpatient Prescriptions on File Prior to Visit  Medication Sig Dispense Refill  . albuterol (PROVENTIL HFA;VENTOLIN HFA) 108 (90 BASE) MCG/ACT inhaler Inhale 2 puffs into the lungs every 6 (six) hours as needed for wheezing. 1 Inhaler 1  . amLODipine (NORVASC) 10 MG tablet Take 1 tablet (10 mg total) by mouth daily. 90 tablet 1  . aspirin 81 MG tablet Take 81 mg by mouth daily.      Marland Kitchen atorvastatin (LIPITOR) 40 MG tablet TAKE 1 TABLET DAILY 90 tablet 1  . carvedilol (COREG) 25 MG tablet TAKE ONE AND ONE-HALF TABLETS TWICE A DAY 270 tablet 2  . diclofenac (VOLTAREN) 75 MG EC tablet Take 1 tablet (75 mg total) by mouth 2 (two) times daily. 30 tablet 0  . fluticasone (FLONASE) 50 MCG/ACT nasal spray  Place 2 sprays into the nose daily as needed for rhinitis or allergies. 16 g 6  . furosemide (LASIX) 40 MG tablet Take 0.5 tablets (20 mg total) by mouth daily. 45 tablet 3  . gemfibrozil (LOPID) 600 MG tablet TAKE 1 TABLET TWICE A DAY BEFORE MEALS 180 tablet 1  . HYDROcodone-acetaminophen (NORCO) 5-325 MG tablet Take 1 tablet by mouth every 6 (six) hours as needed for moderate pain. 30 tablet 0  . Insulin Glargine (LANTUS SOLOSTAR) 100 UNIT/ML Solostar Pen Inject 200 Units into the skin daily. And pen needles 3/day 65 pen PRN  . Krill Oil CAPS MegaRed or a generic by schiff daily    . lisinopril (PRINIVIL,ZESTRIL) 40 MG tablet TAKE 1 TABLET DAILY 90 tablet 2  . methocarbamol (ROBAXIN) 500 MG tablet TAKE ONE TABLET BY MOUTH TWICE DAILY AS NEEDED FOR PAIN OR MUSCLE SPASM 60 tablet 1  . metoCLOPramide (REGLAN) 10 MG tablet Take 1 tablet (10 mg  total) by mouth 3 (three) times daily before meals. 30 tablet 0  . Multiple Vitamin (MULTIVITAMIN) tablet Take 1 tablet by mouth daily.      Marland Kitchen omeprazole (PRILOSEC) 20 MG capsule Take 1 capsule (20 mg total) by mouth daily. 30 capsule 3  . sertraline (ZOLOFT) 100 MG tablet TAKE ONE AND ONE-HALF TABLETS DAILY 135 tablet 0  . triamterene-hydrochlorothiazide (MAXZIDE-25) 37.5-25 MG tablet TAKE 1 TABLET DAILY 90 tablet 0   No current facility-administered medications on file prior to visit.    No Known Allergies  Family History  Problem Relation Age of Onset  . Arthritis      mother/father/paternal grandparents  . Heart disease Father     pacemaker  . Hypertension Father   . Hypertension Paternal Grandfather   . Hypertension Brother   . Emotional abuse Mother   . Diabetes Brother      x 2  . Diabetes Paternal Grandmother     BP 132/68 mmHg  Pulse 92  Temp(Src) 98.8 F (37.1 C) (Oral)  Resp 12  Wt 258 lb (117.028 kg)  SpO2 95%  Review of Systems Denies LOC.     Objective:   Physical Exam VITAL SIGNS:  See vs page GENERAL: no distress Pulses: dorsalis pedis intact bilat.  MSK: no deformity of the feet CV: no leg edema Skin: no ulcer on the feet. normal temp on the feet.  There is patchy hyperpigmentation of the feet. Neuro: sensation is intact to touch on the feet, but decreased from normal.    A1c=9.9%    Assessment & Plan:  DM: The pattern of his cbg's indicates he needs more frequent injections.  He agrees.   Patient is advised the following: Patient Instructions  check your blood sugar twice a day.  vary the time of day when you check, between before the 3 meals, and at bedtime.  also check if you have symptoms of your blood sugar being too high or too low.  please keep a record of the readings and bring it to your next appointment here.  You can write it on any piece of paper.  please call us sooner if your blood sugar goes below 70, or if you have a lot of  readings over 200.  Please come back for a follow-up appointment in 2 months.   On this type of insulin schedule, you should eat meals on a regular schedule.  If a meal is missed or significantly delayed, your blood sugar could go low. If necessary, carry a  non-perishable snack, such as crackers.   Please reduce the lantus to 200 units daily, and:  Add novolog 30 units with breakfast.  i have sent a prescription to your pharmacy.

## 2015-12-31 ENCOUNTER — Encounter: Payer: Self-pay | Admitting: Endocrinology

## 2015-12-31 ENCOUNTER — Other Ambulatory Visit: Payer: Self-pay | Admitting: *Deleted

## 2015-12-31 ENCOUNTER — Ambulatory Visit (INDEPENDENT_AMBULATORY_CARE_PROVIDER_SITE_OTHER): Payer: Commercial Managed Care - PPO | Admitting: Endocrinology

## 2015-12-31 ENCOUNTER — Other Ambulatory Visit (INDEPENDENT_AMBULATORY_CARE_PROVIDER_SITE_OTHER): Payer: Commercial Managed Care - PPO | Admitting: *Deleted

## 2015-12-31 VITALS — BP 132/68 | HR 92 | Temp 98.8°F | Resp 12 | Wt 258.0 lb

## 2015-12-31 DIAGNOSIS — Z794 Long term (current) use of insulin: Secondary | ICD-10-CM

## 2015-12-31 DIAGNOSIS — E1122 Type 2 diabetes mellitus with diabetic chronic kidney disease: Secondary | ICD-10-CM | POA: Diagnosis not present

## 2015-12-31 DIAGNOSIS — N183 Chronic kidney disease, stage 3 unspecified: Secondary | ICD-10-CM

## 2015-12-31 LAB — POCT GLYCOSYLATED HEMOGLOBIN (HGB A1C): Hemoglobin A1C: 9.9

## 2015-12-31 MED ORDER — INSULIN ASPART 100 UNIT/ML FLEXPEN: 30.0000 [IU] | PEN_INJECTOR | Freq: Every day | SUBCUTANEOUS | Status: DC

## 2015-12-31 MED ORDER — INSULIN PEN NEEDLE 31G X 5 MM MISC: Status: AC

## 2015-12-31 MED ORDER — INSULIN LISPRO 100 UNIT/ML (KWIKPEN): 30.0000 [IU] | PEN_INJECTOR | Freq: Every day | SUBCUTANEOUS | Status: DC

## 2015-12-31 NOTE — Telephone Encounter (Signed)
Pt's ins would not cover Novolog. Switching to Humalog, ok per Dr Everardo AllEllison.

## 2016-01-09 ENCOUNTER — Other Ambulatory Visit: Payer: Self-pay | Admitting: Family Medicine

## 2016-01-09 MED ORDER — AMLODIPINE BESYLATE 10 MG PO TABS: 10.0000 mg | ORAL_TABLET | Freq: Every day | ORAL | Status: DC

## 2016-01-10 ENCOUNTER — Other Ambulatory Visit: Payer: Self-pay | Admitting: Family Medicine

## 2016-01-15 ENCOUNTER — Encounter: Payer: Self-pay | Admitting: Family Medicine

## 2016-01-15 ENCOUNTER — Ambulatory Visit (INDEPENDENT_AMBULATORY_CARE_PROVIDER_SITE_OTHER): Payer: Commercial Managed Care - PPO | Admitting: Family Medicine

## 2016-01-15 VITALS — BP 142/80 | HR 76 | Temp 97.7°F | Ht 74.0 in | Wt 256.2 lb

## 2016-01-15 DIAGNOSIS — T7840XA Allergy, unspecified, initial encounter: Secondary | ICD-10-CM | POA: Diagnosis not present

## 2016-01-15 DIAGNOSIS — I10 Essential (primary) hypertension: Secondary | ICD-10-CM | POA: Diagnosis not present

## 2016-01-15 DIAGNOSIS — Z0000 Encounter for general adult medical examination without abnormal findings: Secondary | ICD-10-CM

## 2016-01-15 DIAGNOSIS — E669 Obesity, unspecified: Secondary | ICD-10-CM

## 2016-01-15 DIAGNOSIS — Z720 Tobacco use: Secondary | ICD-10-CM

## 2016-01-15 DIAGNOSIS — E785 Hyperlipidemia, unspecified: Secondary | ICD-10-CM | POA: Diagnosis not present

## 2016-01-15 DIAGNOSIS — L989 Disorder of the skin and subcutaneous tissue, unspecified: Secondary | ICD-10-CM | POA: Diagnosis not present

## 2016-01-15 DIAGNOSIS — R002 Palpitations: Secondary | ICD-10-CM

## 2016-01-15 DIAGNOSIS — R140 Abdominal distension (gaseous): Secondary | ICD-10-CM

## 2016-01-15 LAB — CBC
HCT: 46.9 % (ref 39.0–52.0)
HEMOGLOBIN: 16 g/dL (ref 13.0–17.0)
MCHC: 34.1 g/dL (ref 30.0–36.0)
MCV: 92.2 fl (ref 78.0–100.0)
PLATELETS: 185 10*3/uL (ref 150.0–400.0)
RBC: 5.09 Mil/uL (ref 4.22–5.81)
RDW: 14.8 % (ref 11.5–15.5)
WBC: 10.6 10*3/uL — ABNORMAL HIGH (ref 4.0–10.5)

## 2016-01-15 LAB — LIPID PANEL
CHOL/HDL RATIO: 5
Cholesterol: 139 mg/dL (ref 0–200)
HDL: 26.9 mg/dL — ABNORMAL LOW (ref >39.00)
NONHDL: 111.97
Triglycerides: 372 mg/dL — ABNORMAL HIGH (ref 0.0–149.0)
VLDL: 74.4 mg/dL — AB (ref 0.0–40.0)

## 2016-01-15 LAB — COMPREHENSIVE METABOLIC PANEL
ALT: 17 U/L (ref 0–53)
AST: 14 U/L (ref 0–37)
Albumin: 4.5 g/dL (ref 3.5–5.2)
Alkaline Phosphatase: 91 U/L (ref 39–117)
BILIRUBIN TOTAL: 0.5 mg/dL (ref 0.2–1.2)
BUN: 32 mg/dL — ABNORMAL HIGH (ref 6–23)
CALCIUM: 9.9 mg/dL (ref 8.4–10.5)
CHLORIDE: 96 meq/L (ref 96–112)
CO2: 32 meq/L (ref 19–32)
CREATININE: 1.35 mg/dL (ref 0.40–1.50)
GFR: 56.74 mL/min — AB (ref >60.00)
GLUCOSE: 279 mg/dL — AB (ref 70–99)
Potassium: 5.3 mEq/L — ABNORMAL HIGH (ref 3.5–5.1)
Sodium: 134 mEq/L — ABNORMAL LOW (ref 135–145)
Total Protein: 7.3 g/dL (ref 6.0–8.3)

## 2016-01-15 LAB — TSH: TSH: 0.61 u[IU]/mL (ref 0.35–4.50)

## 2016-01-15 LAB — LDL CHOLESTEROL, DIRECT: Direct LDL: 60 mg/dL

## 2016-01-15 MED ORDER — INSULIN GLARGINE 100 UNIT/ML SOLOSTAR PEN: 200.0000 [IU] | PEN_INJECTOR | Freq: Every day | SUBCUTANEOUS | Status: DC

## 2016-01-15 MED ORDER — FLUTICASONE PROPIONATE 50 MCG/ACT NA SUSP: 2.0000 | Freq: Every day | NASAL | Status: DC | PRN

## 2016-01-15 MED ORDER — NICOTINE 7 MG/24HR TD PT24: 7.0000 mg | MEDICATED_PATCH | Freq: Every day | TRANSDERMAL | Status: DC

## 2016-01-15 MED ORDER — AMLODIPINE BESYLATE 10 MG PO TABS: 10.0000 mg | ORAL_TABLET | Freq: Every day | ORAL | Status: DC

## 2016-01-15 NOTE — Assessment & Plan Note (Signed)
Well controlled, no changes to meds. Encouraged heart healthy diet such as the DASH diet and exercise as tolerated.  °

## 2016-01-15 NOTE — Progress Notes (Signed)
Pre visit review using our clinic review tool, if applicable. No additional management support is needed unless otherwise documented below in the visit note. 

## 2016-01-15 NOTE — Assessment & Plan Note (Signed)
Encouraged DASH diet, decrease po intake and increase exercise as tolerated. Needs 7-8 hours of sleep nightly. Avoid trans fats, eat small, frequent meals every 4-5 hours with lean proteins, complex carbs and healthy fats. Minimize simple carbs 

## 2016-01-15 NOTE — Patient Instructions (Signed)
Smoking Cessation, Tips for Success If you are ready to quit smoking, congratulations! You have chosen to help yourself be healthier. Cigarettes bring nicotine, tar, carbon monoxide, and other irritants into your body. Your lungs, heart, and blood vessels will be able to work better without these poisons. There are many different ways to quit smoking. Nicotine gum, nicotine patches, a nicotine inhaler, or nicotine nasal spray can help with physical craving. Hypnosis, support groups, and medicines help break the habit of smoking. WHAT THINGS CAN I DO TO MAKE QUITTING EASIER?  Here are some tips to help you quit for good:  Pick a date when you will quit smoking completely. Tell all of your friends and family about your plan to quit on that date.  Do not try to slowly cut down on the number of cigarettes you are smoking. Pick a quit date and quit smoking completely starting on that day.  Throw away all cigarettes.   Clean and remove all ashtrays from your home, work, and car.  On a card, write down your reasons for quitting. Carry the card with you and read it when you get the urge to smoke.  Cleanse your body of nicotine. Drink enough water and fluids to keep your urine clear or pale yellow. Do this after quitting to flush the nicotine from your body.  Learn to predict your moods. Do not let a bad situation be your excuse to have a cigarette. Some situations in your life might tempt you into wanting a cigarette.  Never have "just one" cigarette. It leads to wanting another and another. Remind yourself of your decision to quit.  Change habits associated with smoking. If you smoked while driving or when feeling stressed, try other activities to replace smoking. Stand up when drinking your coffee. Brush your teeth after eating. Sit in a different chair when you read the paper. Avoid alcohol while trying to quit, and try to drink fewer caffeinated beverages. Alcohol and caffeine may urge you to  smoke.  Avoid foods and drinks that can trigger a desire to smoke, such as sugary or spicy foods and alcohol.  Ask people who smoke not to smoke around you.  Have something planned to do right after eating or having a cup of coffee. For example, plan to take a walk or exercise.  Try a relaxation exercise to calm you down and decrease your stress. Remember, you may be tense and nervous for the first 2 weeks after you quit, but this will pass.  Find new activities to keep your hands busy. Play with a pen, coin, or rubber band. Doodle or draw things on paper.  Brush your teeth right after eating. This will help cut down on the craving for the taste of tobacco after meals. You can also try mouthwash.   Use oral substitutes in place of cigarettes. Try using lemon drops, carrots, cinnamon sticks, or chewing gum. Keep them handy so they are available when you have the urge to smoke.  When you have the urge to smoke, try deep breathing.  Designate your home as a nonsmoking area.  If you are a heavy smoker, ask your health care provider about a prescription for nicotine chewing gum. It can ease your withdrawal from nicotine.  Reward yourself. Set aside the cigarette money you save and buy yourself something nice.  Look for support from others. Join a support group or smoking cessation program. Ask someone at home or at work to help you with your plan   to quit smoking.  Always ask yourself, "Do I need this cigarette or is this just a reflex?" Tell yourself, "Today, I choose not to smoke," or "I do not want to smoke." You are reminding yourself of your decision to quit.  Do not replace cigarette smoking with electronic cigarettes (commonly called e-cigarettes). The safety of e-cigarettes is unknown, and some may contain harmful chemicals.  If you relapse, do not give up! Plan ahead and think about what you will do the next time you get the urge to smoke. HOW WILL I FEEL WHEN I QUIT SMOKING? You  may have symptoms of withdrawal because your body is used to nicotine (the addictive substance in cigarettes). You may crave cigarettes, be irritable, feel very hungry, cough often, get headaches, or have difficulty concentrating. The withdrawal symptoms are only temporary. They are strongest when you first quit but will go away within 10-14 days. When withdrawal symptoms occur, stay in control. Think about your reasons for quitting. Remind yourself that these are signs that your body is healing and getting used to being without cigarettes. Remember that withdrawal symptoms are easier to treat than the major diseases that smoking can cause.  Even after the withdrawal is over, expect periodic urges to smoke. However, these cravings are generally short lived and will go away whether you smoke or not. Do not smoke! WHAT RESOURCES ARE AVAILABLE TO HELP ME QUIT SMOKING? Your health care provider can direct you to community resources or hospitals for support, which may include:  Group support.  Education.  Hypnosis.  Therapy.   This information is not intended to replace advice given to you by your health care provider. Make sure you discuss any questions you have with your health care provider.   Document Released: 07/02/2004 Document Revised: 10/25/2014 Document Reviewed: 03/22/2013 Elsevier Interactive Patient Education 2016 [employer]  

## 2016-01-15 NOTE — Assessment & Plan Note (Addendum)
1ppd, agrees to nicotine patches. Encouraged complete cessation. Discussed need to quit as relates to risk of numerous cancers, cardiac and pulmonary disease as well as neurologic complications. Counseled for greater than 3 minutes
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
## 2016-01-15 NOTE — Assessment & Plan Note (Signed)
No recent episodes
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
## 2016-01-15 NOTE — Assessment & Plan Note (Signed)
Tolerating statin, encouraged heart healthy diet, avoid trans fats, minimize simple carbs and saturated fats. Increase exercise as tolerated 

## 2016-01-15 NOTE — Assessment & Plan Note (Signed)
Referred to dermatology for lesions on scalp

## 2016-01-16 ENCOUNTER — Other Ambulatory Visit: Payer: Self-pay | Admitting: Family Medicine

## 2016-01-28 ENCOUNTER — Other Ambulatory Visit: Payer: Self-pay | Admitting: Family Medicine

## 2016-02-20 ENCOUNTER — Other Ambulatory Visit: Payer: Self-pay | Admitting: Family Medicine

## 2016-03-02 ENCOUNTER — Ambulatory Visit: Payer: Self-pay | Admitting: Endocrinology

## 2016-03-10 ENCOUNTER — Encounter: Payer: Self-pay | Admitting: Endocrinology

## 2016-03-10 ENCOUNTER — Ambulatory Visit (INDEPENDENT_AMBULATORY_CARE_PROVIDER_SITE_OTHER): Payer: Commercial Managed Care - PPO | Admitting: Endocrinology

## 2016-03-10 VITALS — BP 146/88 | HR 91 | Temp 98.1°F | Ht 74.0 in | Wt 257.0 lb

## 2016-03-10 DIAGNOSIS — E1122 Type 2 diabetes mellitus with diabetic chronic kidney disease: Secondary | ICD-10-CM | POA: Diagnosis not present

## 2016-03-10 LAB — POCT GLYCOSYLATED HEMOGLOBIN (HGB A1C): Hemoglobin A1C: 11.8

## 2016-03-10 MED ORDER — INSULIN LISPRO 100 UNIT/ML (KWIKPEN): 40.0000 [IU] | PEN_INJECTOR | Freq: Every day | SUBCUTANEOUS | Status: DC

## 2016-03-10 MED ORDER — INSULIN GLARGINE 100 UNIT/ML SOLOSTAR PEN: 220.0000 [IU] | PEN_INJECTOR | Freq: Every day | SUBCUTANEOUS | Status: DC

## 2016-03-10 MED ORDER — GLUCOSE BLOOD VI STRP: 1.0000 | ORAL_STRIP | Freq: Two times a day (BID) | Status: AC

## 2016-03-10 NOTE — Patient Instructions (Addendum)
check your blood sugar twice a day.  vary the time of day when you check, between before the 3 meals, and at bedtime.  also check if you have symptoms of your blood sugar being too high or too low.  please keep a record of the readings and bring it to your next appointment here.  You can write it on any piece of paper.  please call us sooner if your blood sugar goes below 70, or if you have a lot of readings over 200.  Please come back for a follow-up appointment in 2 weeks.   On this type of insulin schedule, you should eat meals on a regular schedule.  If a meal is missed or significantly delayed, your blood sugar could go low. If necessary, carry a non-perishable snack, such as crackers.   Please increase the lantus to 220 units daily, and:  Increase the humalog to 40 units with breakfast.  i have sent a prescription to your pharmacy.  Here are 2 new meters.  i have sent a prescription to your pharmacy, for strips.   Please call us next week, to tell us how the blood sugar is doing.   Please consider having weight loss surgery.  It is good for your health.  Here is some information about it.  If you decide to consider further, please call the phone number in the papers, and register for a free informational meeting.
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
## 2016-03-16 ENCOUNTER — Ambulatory Visit (INDEPENDENT_AMBULATORY_CARE_PROVIDER_SITE_OTHER): Payer: Commercial Managed Care - PPO | Admitting: Physician Assistant

## 2016-03-16 ENCOUNTER — Encounter: Payer: Self-pay | Admitting: Physician Assistant

## 2016-03-16 VITALS — BP 110/72 | HR 73 | Temp 97.7°F | Resp 16 | Ht 74.0 in | Wt 254.4 lb

## 2016-03-16 DIAGNOSIS — M542 Cervicalgia: Secondary | ICD-10-CM | POA: Diagnosis not present

## 2016-03-16 MED ORDER — HYDROCODONE-ACETAMINOPHEN 5-325 MG PO TABS: 1.0000 | ORAL_TABLET | Freq: Four times a day (QID) | ORAL | Status: DC | PRN

## 2016-03-16 MED ORDER — CYCLOBENZAPRINE HCL 10 MG PO TABS: 10.0000 mg | ORAL_TABLET | Freq: Three times a day (TID) | ORAL | Status: DC | PRN

## 2016-03-16 NOTE — Patient Instructions (Signed)
Please take the Flexeril and pain medication as directed. No driving or operating machinery while on these medications.  I have placed a referral back to your Neurosurgeon.   Apply [facility] or Aspercreme to the neck. Heating pad may also be beneficial. No heavy lifting or overexertion. Follow-up if symptoms are not resolving.

## 2016-03-16 NOTE — Progress Notes (Signed)
Patient presents to clinic today c/o 5 days of neck pain with muscle tension and spasm. Endorses pain with turning head. Denies trauma or injury to the neck. Has history of cervicalgia, previously followed by Neurology and s/p nerve block.   Past Medical History  Diagnosis Date  . Arthritis   . Depression   . History of chronic bronchitis     dx 2011  . Seasonal allergies   . Aortic stenosis   . Hypertension   . Diabetes mellitus, type 2 (HCC)   . Hyperlipidemia   . History of stroke 1992    blood clot at base of brain-[facility]  . Congestive heart failure (HCC)     September 2011-CHF  . History of chicken pox   . Obesity, unspecified 11/27/2012  . Tobacco abuse 11/27/2012    Down from 3 ppd to 1/2 ppd  . Allergic state 02/08/2013  . Acute low back pain with radicular symptoms, duration less than 6 weeks 05/16/2013  . Viral URI 05/26/2014  . Unspecified hereditary and idiopathic peripheral neuropathy 05/26/2014    Tingling in toes  . Preventative health care 03/08/2015  . Abdominal bloating 09/12/2015    Current Outpatient Prescriptions on File Prior to Visit  Medication Sig Dispense Refill  . albuterol (PROVENTIL HFA;VENTOLIN HFA) 108 (90 BASE) MCG/ACT inhaler Inhale 2 puffs into the lungs every 6 (six) hours as needed for wheezing. 1 Inhaler 1  . amLODipine (NORVASC) 10 MG tablet Take 1 tablet (10 mg total) by mouth daily. 90 tablet 1  . aspirin 81 MG tablet Take 81 mg by mouth daily.      Marland Kitchen atorvastatin (LIPITOR) 40 MG tablet Take 1.5 tablets daily    . carvedilol (COREG) 25 MG tablet TAKE ONE AND ONE-HALF TABLETS TWICE A DAY 270 tablet 1  . Chromium Picolinate 500 MCG TABS Take 500 mcg by mouth daily.    . diclofenac (VOLTAREN) 75 MG EC tablet Take 1 tablet (75 mg total) by mouth 2 (two) times daily. 30 tablet 0  . fluticasone (FLONASE) 50 MCG/ACT nasal spray Place 2 sprays into both nostrils daily as needed for rhinitis or allergies. 16 g 6  . furosemide (LASIX) 40  MG tablet Take 0.5 tablets (20 mg total) by mouth daily. 45 tablet 3  . gemfibrozil (LOPID) 600 MG tablet TAKE 1 TABLET TWICE A DAY BEFORE MEALS 180 tablet 1  . glucose blood (ONETOUCH VERIO) test strip 1 each by Other route 2 (two) times daily. And lancets 2/day 180 each 12  . HYDROcodone-acetaminophen (NORCO) 5-325 MG tablet Take 1 tablet by mouth every 6 (six) hours as needed for moderate pain. 30 tablet 0  . Insulin Glargine (LANTUS SOLOSTAR) 100 UNIT/ML Solostar Pen Inject 220 Units into the skin daily. 65 pen PRN  . insulin lispro (HUMALOG KWIKPEN) 100 UNIT/ML KiwkPen Inject 0.4 mLs (40 Units total) into the skin daily with breakfast. 15 mL 2  . Insulin Pen Needle 31G X 5 MM MISC USE AS DIRECTED ONE TIME DAILY. 150 each 1  . Krill Oil CAPS MegaRed or a generic by schiff daily    . lisinopril (PRINIVIL,ZESTRIL) 40 MG tablet Take 1 tablet (40 mg total) by mouth daily. 90 tablet 0  . methocarbamol (ROBAXIN) 500 MG tablet TAKE ONE TABLET BY MOUTH TWICE DAILY AS NEEDED FOR PAIN OR MUSCLE SPASM 60 tablet 1  . metoCLOPramide (REGLAN) 10 MG tablet Take 1 tablet (10 mg total) by mouth 3 (three) times daily before  meals. 30 tablet 0  . Multiple Vitamin (MULTIVITAMIN) tablet Take 1 tablet by mouth daily.      . nicotine (NICODERM CQ - DOSED IN MG/24 HR) 7 mg/24hr patch Place 1 patch (7 mg total) onto the skin daily. 28 patch 0  . omeprazole (PRILOSEC) 20 MG capsule Take 1 capsule (20 mg total) by mouth daily. 30 capsule 3  . Probiotic Product (PROBIOTIC DAILY PO) Take 1 capsule by mouth daily.    . sertraline (ZOLOFT) 100 MG tablet TAKE ONE AND ONE-HALF TABLETS DAILY 135 tablet 0  . triamterene-hydrochlorothiazide (MAXZIDE-25) 37.5-25 MG tablet TAKE 1 TABLET DAILY 90 tablet 0   No current facility-administered medications on file prior to visit.    No Known Allergies  Family History  Problem Relation Age of Onset  . Arthritis      mother/father/paternal grandparents  . Heart disease Father      pacemaker  . Hypertension Father   . Hypertension Paternal Grandfather   . Hypertension Brother   . Emotional abuse Mother   . Diabetes Brother      x 2  . Diabetes Paternal Grandmother     Social History   Social History  . Marital Status: Widowed    Spouse Name: N/A  . Number of Children: 2  . 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 Smoking status: Current Some Day Smoker -- 1.00 packs/day  . Smokeless tobacco: Never Used     Comment: smokes a pack per day-started in 1972, trying to stop smokes 10 per day.  . Alcohol Use: No  . Drug Use: No  . Sexual Activity: No   Other Topics Concern  . None   Social History Narrative   Regular exercise-no   Caffeine Use-yes         Review of Systems - See HPI.  All other ROS are negative.  BP 110/72 mmHg  Pulse 73  Temp(Src) 97.7 F (36.5 C) (Oral)  Resp 16  Ht 6\' 2"  (1.88 m)  Wt 254 lb 6 oz (115.384 kg)  BMI 32.65 kg/m2  SpO2 96%  Physical Exam  Constitutional: He is oriented to person, place, and time and well-developed, well-nourished, and in no distress.  HENT:  Head: Normocephalic and atraumatic.  Eyes: Conjunctivae are normal.  Neck: Neck supple. Muscular tenderness present. No spinous process tenderness present. No rigidity. Normal range of motion present.  Cardiovascular: Normal rate, regular rhythm, normal heart sounds and intact distal pulses.   Pulmonary/Chest: Effort normal and breath sounds normal. No respiratory distress. He has no wheezes. He has no rales. He exhibits no tenderness.  Musculoskeletal:       Cervical back: He exhibits tenderness and spasm. He exhibits no bony tenderness.  Neurological: He is alert and oriented to person, place, and time.  Skin: Skin is warm and dry. No rash noted.  Psychiatric: Affect normal.  Vitals reviewed.   Recent Results (from the past 2160 hour(s))  POCT HgB A1C     Status: Abnormal    Collection Time: 12/31/15  8:47 AM  Result Value Ref Range   Hemoglobin A1C 9.9   CBC     Status: Abnormal   Collection Time: 01/15/16 12:18 PM  Result Value Ref Range   WBC 10.6 (H) 4.0 - 10.5 K/uL   RBC 5.09 4.22 - 5.81 Mil/uL   Platelets 185.0 150.0 - 400.0 K/uL   Hemoglobin 16.0 13.0 -  17.0 g/dL   HCT 16.1 09.6 - 04.5 %   MCV 92.2 78.0 - 100.0 fl   MCHC 34.1 30.0 - 36.0 g/dL   RDW 40.9 81.1 - 91.4 %  TSH     Status: None   Collection Time: 01/15/16 12:18 PM  Result Value Ref Range   TSH 0.61 0.35 - 4.50 uIU/mL  Lipid panel     Status: Abnormal   Collection Time: 01/15/16 12:18 PM  Result Value Ref Range   Cholesterol 139 0 - 200 mg/dL    Comment: ATP III Classification       Desirable:  < 200 mg/dL               Borderline High:  200 - 239 mg/dL          High:  > = 782 mg/dL   Triglycerides 956.2 (H) 0.0 - 149.0 mg/dL    Comment: Normal:  <130 mg/dLBorderline High:  150 - 199 mg/dL   HDL 86.57 (L) >84.69 mg/dL   VLDL 62.9 (H) 0.0 - 52.8 mg/dL   Total CHOL/HDL Ratio 5     Comment:                Men          Women1/2 Average Risk     3.4          3.3Average Risk          5.0          4.42X Average Risk          9.6          7.13X Average Risk          15.0          11.0                       NonHDL 111.97     Comment: NOTE:  Non-HDL goal should be 30 mg/dL higher than patient's LDL goal (i.e. LDL goal of < 70 mg/dL, would have non-HDL goal of < 100 mg/dL)  Comprehensive metabolic panel     Status: Abnormal   Collection Time: 01/15/16 12:18 PM  Result Value Ref Range   Sodium 134 (L) 135 - 145 mEq/L   Potassium 5.3 (H) 3.5 - 5.1 mEq/L   Chloride 96 96 - 112 mEq/L   CO2 32 19 - 32 mEq/L   Glucose, Bld 279 (H) 70 - 99 mg/dL   BUN 32 (H) 6 - 23 mg/dL   Creatinine, Ser 4.13 0.40 - 1.50 mg/dL   Total Bilirubin 0.5 0.2 - 1.2 mg/dL   Alkaline Phosphatase 91 39 - 117 U/L   AST 14 0 - 37 U/L   ALT 17 0 - 53 U/L   Total Protein 7.3 6.0 - 8.3 g/dL   Albumin 4.5 3.5 - 5.2 g/dL    Calcium 9.9 8.4 - 24.4 mg/dL   GFR 01.02 (L) >72.53 mL/min  LDL cholesterol, direct     Status: None   Collection Time: 01/15/16 12:18 PM  Result Value Ref Range   Direct LDL 60.0 mg/dL    Comment: Optimal:  <664 mg/dLNear or Above Optimal:  100-129 mg/dLBorderline High:  130-159 mg/dLHigh:  160-189 mg/dLVery High:  >190 mg/dL  POCT glycosylated hemoglobin (Hb A1C)     Status: None   Collection Time: 03/10/16 11:18 AM  Result Value Ref Range   Hemoglobin A1C 11.8    Assessment/Plan: 1. Cervicalgia With  history of cervical disc disease. No trauma. Symptoms and exam consistent with muscular tension and spasm. Rx Flexeril and Hydrocodone. Supportive measures reviewed. FU if not improving within 48-72 hours.  - cyclobenzaprine (FLEXERIL) 10 MG tablet; Take 1 tablet (10 mg total) by mouth 3 (three) times daily as needed for muscle spasms.  Dispense: 30 tablet; Refill: 0 - HYDROcodone-acetaminophen (NORCO) 5-325 MG tablet; Take 1 tablet by mouth every 6 (six) hours as needed for moderate pain.  Dispense: 30 tablet; Refill: 0

## 2016-03-16 NOTE — Progress Notes (Signed)
Pre visit review using our clinic review tool, if applicable. No additional management support is needed unless otherwise documented below in the visit note/SLS  

## 2016-03-18 ENCOUNTER — Other Ambulatory Visit: Payer: Self-pay | Admitting: Family Medicine

## 2016-03-24 ENCOUNTER — Other Ambulatory Visit: Payer: Self-pay | Admitting: Family Medicine

## 2016-04-06 ENCOUNTER — Telehealth: Payer: Self-pay | Admitting: Endocrinology

## 2016-04-06 NOTE — Telephone Encounter (Signed)
Reading have been average of 259 high of 400 and low of 89 over the past month

## 2016-04-06 NOTE — Telephone Encounter (Signed)
I cannot adjust the doses w/o seeing sugars throughout the day. Can he fax us his log or meter download? I would need sugars before meals and at bedtime to see patterns.

## 2016-04-06 NOTE — Telephone Encounter (Signed)
I contacted the pt and requested him to stop by the office so we cold download his meter. Requested a call back if the pt would like to discuss.

## 2016-04-06 NOTE — Telephone Encounter (Signed)
See note below. Pt is currently taking 40 units of humalog with breakfast and 220 units of lantus daily. Could you review and please advise during Dr. George HughEllison's absence? Thanks!

## 2016-04-13 ENCOUNTER — Other Ambulatory Visit: Payer: Self-pay

## 2016-04-13 ENCOUNTER — Telehealth: Payer: Self-pay

## 2016-04-13 ENCOUNTER — Telehealth: Payer: Self-pay | Admitting: Endocrinology

## 2016-04-13 MED ORDER — METFORMIN HCL 500 MG PO TABS: 1000.0000 mg | ORAL_TABLET | Freq: Every day | ORAL | Status: DC

## 2016-04-13 NOTE — Telephone Encounter (Signed)
I contacted the pt and advised Per Dr. Elvera LennoxGherghe to start metformin 500 mg with dinner for three days then increase to 1000 mg with dinner. Pt voiced understanding and will call back on Monday to report blood sugar readings.

## 2016-04-13 NOTE — Telephone Encounter (Signed)
Expand All Collapse All   I contacted the pt and advised Per Dr. Elvera LennoxGherghe to start metformin 500 mg with dinner for three days then increase to 1000 mg with dinner. Pt voiced understanding and will call back on Monday to report blood sugar readings.

## 2016-04-13 NOTE — Telephone Encounter (Signed)
PT stated he was returning your call, requests call back.

## 2016-04-16 ENCOUNTER — Ambulatory Visit (INDEPENDENT_AMBULATORY_CARE_PROVIDER_SITE_OTHER): Payer: Commercial Managed Care - PPO | Admitting: Family Medicine

## 2016-04-16 ENCOUNTER — Encounter: Payer: Self-pay | Admitting: Family Medicine

## 2016-04-16 VITALS — BP 132/86 | HR 77 | Temp 98.1°F | Ht 74.0 in | Wt 258.2 lb

## 2016-04-16 DIAGNOSIS — E119 Type 2 diabetes mellitus without complications: Secondary | ICD-10-CM

## 2016-04-16 DIAGNOSIS — E669 Obesity, unspecified: Secondary | ICD-10-CM

## 2016-04-16 DIAGNOSIS — M542 Cervicalgia: Secondary | ICD-10-CM

## 2016-04-16 DIAGNOSIS — I10 Essential (primary) hypertension: Secondary | ICD-10-CM | POA: Diagnosis not present

## 2016-04-16 DIAGNOSIS — Z720 Tobacco use: Secondary | ICD-10-CM

## 2016-04-16 DIAGNOSIS — E785 Hyperlipidemia, unspecified: Secondary | ICD-10-CM | POA: Diagnosis not present

## 2016-04-16 DIAGNOSIS — E1169 Type 2 diabetes mellitus with other specified complication: Secondary | ICD-10-CM | POA: Insufficient documentation

## 2016-04-16 HISTORY — DX: Type 2 diabetes mellitus with other specified complication: E11.69

## 2016-04-16 HISTORY — DX: Type 2 diabetes mellitus with other specified complication: E66.9

## 2016-04-16 LAB — LIPID PANEL
CHOL/HDL RATIO: 5
CHOLESTEROL: 131 mg/dL (ref 0–200)
HDL: 26.4 mg/dL — ABNORMAL LOW (ref >39.00)
NonHDL: 104.64
TRIGLYCERIDES: 318 mg/dL — AB (ref 0.0–149.0)
VLDL: 63.6 mg/dL — ABNORMAL HIGH (ref 0.0–40.0)

## 2016-04-16 LAB — TSH: TSH: 0.99 u[IU]/mL (ref 0.35–4.50)

## 2016-04-16 LAB — COMPREHENSIVE METABOLIC PANEL
ALBUMIN: 4.6 g/dL (ref 3.5–5.2)
ALK PHOS: 104 U/L (ref 39–117)
ALT: 20 U/L (ref 0–53)
AST: 15 U/L (ref 0–37)
BILIRUBIN TOTAL: 0.5 mg/dL (ref 0.2–1.2)
BUN: 21 mg/dL (ref 6–23)
CALCIUM: 9.7 mg/dL (ref 8.4–10.5)
CO2: 28 mEq/L (ref 19–32)
Chloride: 96 mEq/L (ref 96–112)
Creatinine, Ser: 1.26 mg/dL (ref 0.40–1.50)
GFR: 61.4 mL/min (ref >60.00)
Glucose, Bld: 383 mg/dL — ABNORMAL HIGH (ref 70–99)
Potassium: 5.1 mEq/L (ref 3.5–5.1)
Sodium: 131 mEq/L — ABNORMAL LOW (ref 135–145)
TOTAL PROTEIN: 7.2 g/dL (ref 6.0–8.3)

## 2016-04-16 LAB — CBC
HEMATOCRIT: 45.5 % (ref 39.0–52.0)
Hemoglobin: 15.7 g/dL (ref 13.0–17.0)
MCHC: 34.5 g/dL (ref 30.0–36.0)
MCV: 91.7 fl (ref 78.0–100.0)
PLATELETS: 191 10*3/uL (ref 150.0–400.0)
RBC: 4.96 Mil/uL (ref 4.22–5.81)
RDW: 13.9 % (ref 11.5–15.5)
WBC: 10.3 10*3/uL (ref 4.0–10.5)

## 2016-04-16 LAB — LDL CHOLESTEROL, DIRECT: LDL DIRECT: 68 mg/dL

## 2016-04-16 MED ORDER — FUROSEMIDE 40 MG PO TABS: 20.0000 mg | ORAL_TABLET | Freq: Every day | ORAL | Status: DC

## 2016-04-16 NOTE — Assessment & Plan Note (Signed)
Has established with endocrinology, is tolerating Metformin. Is checking sugars more and is watching his sugars more

## 2016-04-16 NOTE — Assessment & Plan Note (Signed)
Encouraged DASH diet, decrease po intake and increase exercise as tolerated. Needs 7-8 hours of sleep nightly. Avoid trans fats, eat small, frequent meals every 4-5 hours with lean proteins, complex carbs and healthy fats. Minimize simple carbs, GMO foods. Has a gastric bypass consultation next month

## 2016-04-16 NOTE — Assessment & Plan Note (Signed)
Well controlled, no changes to meds. Encouraged heart healthy diet such as the DASH diet and exercise as tolerated.  °

## 2016-04-16 NOTE — Assessment & Plan Note (Signed)
L>R is locking intermittently, muscle relaxers give him some relief. Is considering going back to Dr Dutch QuintPoole to proceed with a nerve block which he has had relief from in past. Has been unable to work this week due to pain and dysfunction.

## 2016-04-16 NOTE — Patient Instructions (Signed)

## 2016-04-16 NOTE — Assessment & Plan Note (Signed)
Encouraged heart healthy diet, increase exercise, avoid trans fats, consider a krill oil cap daily 

## 2016-04-16 NOTE — Assessment & Plan Note (Signed)
Encouraged complete cessation. Discussed need to quit as relates to risk of numerous cancers, cardiac and pulmonary disease as well as neurologic complications. Counseled for greater than 3 minutes 

## 2016-04-16 NOTE — Progress Notes (Signed)
Patient ID: Gabriel [name]   DOB: [birth year], 65 y.o.   MRN: [id number]   Subjective:    Patient ID: Gabriel [name], male    DOB: 1951/06/18, 65 y.o.   MRN: [id number]  Chief Complaint  Patient presents with  . Follow-up    HPI Patient is in today for follow up. Is struggling with increased neck pain recently and is considering returning to see his neurosurgeon Dr Dutch Quint for another nerve block which has helped him in past. Missed work a couple times this week due to pain. No polyuria or polydipsia. Low sugar 68 and hi sugar 440. Average is 259. Still struggling with smoking but down to 5 cigarettes daily. Mild cough. Denies CP/palp/SOB/HA/congestion/fevers/GI or GU c/o. Taking meds as prescribed  Past Medical History  Diagnosis Date  . Arthritis   . Depression   . History of chronic bronchitis     dx 2011  . Seasonal allergies   . Aortic stenosis   . Hypertension   . Diabetes mellitus, type 2 (HCC)   . Hyperlipidemia   . History of stroke 1992    blood clot at base of brain-[facility]  . Congestive heart failure (HCC)     September 2011-CHF  . History of chicken pox   . Obesity, unspecified 11/27/2012  . Tobacco abuse 11/27/2012    Down from 3 ppd to 1/2 ppd  . Allergic state 02/08/2013  . Acute low back pain with radicular symptoms, duration less than 6 weeks 05/16/2013  . Viral URI 05/26/2014  . Unspecified hereditary and idiopathic peripheral neuropathy 05/26/2014    Tingling in toes  . Preventative health care 03/08/2015  . Abdominal bloating 09/12/2015  . Diabetes mellitus type 2 in obese (HCC) 04/16/2016    Past Surgical History  Procedure Laterality Date  . Knee surgery  1976, 1989    right knee torn ligament repair  . Lumbar fusion  1982    L4-L5  . Revision total knee arthroplasty  2007    right knee replacement    Family History  Problem Relation Age of Onset  . Arthritis      mother/father/paternal grandparents  . Heart disease Father    pacemaker  . Hypertension Father   . Hypertension Paternal Grandfather   . Hypertension Brother   . Emotional abuse Mother   . Diabetes Brother      x 2  . Diabetes Paternal Grandmother     Social History   Social History  . Marital Status: Widowed    Spouse Name: N/A  . Number of Children: 2  . 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 Smoking status: Current Some Day Smoker -- 1.00 packs/day  . Smokeless tobacco: Never Used     Comment: smokes a pack per day-started in 1972, trying to stop smokes 10 per day.  . Alcohol Use: No  . Drug Use: No  . Sexual Activity: No   Other Topics Concern  . Not on file   Social History Narrative   Regular exercise-no   Caffeine Use-yes          Outpatient Prescriptions Prior to Visit  Medication Sig Dispense Refill  . albuterol (PROVENTIL HFA;VENTOLIN HFA) 108 (90 BASE) MCG/ACT inhaler Inhale 2 puffs into the lungs every 6 (six) hours as needed for wheezing. 1 Inhaler 1  . amLODipine (NORVASC) 10 MG tablet Take 1 tablet (10 mg total)  by mouth daily. 90 tablet 1  . aspirin 81 MG tablet Take 81 mg by mouth daily.      Marland Kitchen. atorvastatin (LIPITOR) 40 MG tablet Take 1.5 tablets daily    . carvedilol (COREG) 25 MG tablet TAKE ONE AND ONE-HALF TABLETS TWICE A DAY 270 tablet 1  . Chromium Picolinate 500 MCG TABS Take 500 mcg by mouth daily.    . cyclobenzaprine (FLEXERIL) 10 MG tablet Take 1 tablet (10 mg total) by mouth 3 (three) times daily as needed for muscle spasms. 30 tablet 0  . diclofenac (VOLTAREN) 75 MG EC tablet Take 1 tablet (75 mg total) by mouth 2 (two) times daily. 30 tablet 0  . fluticasone (FLONASE) 50 MCG/ACT nasal spray Place 2 sprays into both nostrils daily as needed for rhinitis or allergies. 16 g 6  . gemfibrozil (LOPID) 600 MG tablet TAKE 1 TABLET TWICE A DAY BEFORE MEALS 180 tablet 0  . glucose blood (ONETOUCH VERIO) test strip 1 each by Other route 2  (two) times daily. And lancets 2/day 180 each 12  . HYDROcodone-acetaminophen (NORCO) 5-325 MG tablet Take 1 tablet by mouth every 6 (six) hours as needed for moderate pain. 30 tablet 0  . Insulin Glargine (LANTUS SOLOSTAR) 100 UNIT/ML Solostar Pen Inject 220 Units into the skin daily. 65 pen PRN  . insulin lispro (HUMALOG KWIKPEN) 100 UNIT/ML KiwkPen Inject 0.4 mLs (40 Units total) into the skin daily with breakfast. 15 mL 2  . Insulin Pen Needle 31G X 5 MM MISC USE AS DIRECTED ONE TIME DAILY. 150 each 1  . Krill Oil CAPS MegaRed or a generic by schiff daily    . lisinopril (PRINIVIL,ZESTRIL) 40 MG tablet Take 1 tablet (40 mg total) by mouth daily. 90 tablet 0  . metFORMIN (GLUCOPHAGE) 500 MG tablet Take 2 tablets (1,000 mg total) by mouth daily. 180 tablet 3  . methocarbamol (ROBAXIN) 500 MG tablet TAKE ONE TABLET BY MOUTH TWICE DAILY AS NEEDED FOR PAIN OR MUSCLE SPASM 60 tablet 1  . metoCLOPramide (REGLAN) 10 MG tablet Take 1 tablet (10 mg total) by mouth 3 (three) times daily before meals. 30 tablet 0  . Multiple Vitamin (MULTIVITAMIN) tablet Take 1 tablet by mouth daily.      . nicotine (NICODERM CQ - DOSED IN MG/24 HR) 7 mg/24hr patch Place 1 patch (7 mg total) onto the skin daily. 28 patch 0  . omeprazole (PRILOSEC) 20 MG capsule Take 1 capsule (20 mg total) by mouth daily. 30 capsule 3  . Probiotic Product (PROBIOTIC DAILY PO) Take 1 capsule by mouth daily.    . sertraline (ZOLOFT) 100 MG tablet TAKE ONE AND ONE-HALF TABLETS DAILY 135 tablet 1  . triamterene-hydrochlorothiazide (MAXZIDE-25) 37.5-25 MG tablet TAKE 1 TABLET DAILY 90 tablet 0  . furosemide (LASIX) 40 MG tablet Take 0.5 tablets (20 mg total) by mouth daily. 45 tablet 3   No facility-administered medications prior to visit.    No Known Allergies  Review of Systems  Constitutional: Negative for fever and malaise/fatigue.  HENT: Negative for congestion.   Eyes: Negative for blurred vision.  Respiratory: Negative for  shortness of breath.   Cardiovascular: Negative for chest pain, palpitations and leg swelling.  Gastrointestinal: Negative for nausea, abdominal pain and blood in stool.  Genitourinary: Negative for dysuria and frequency.  Musculoskeletal: Positive for neck pain. Negative for falls.  Skin: Negative for rash.  Neurological: Negative for dizziness, loss of consciousness and headaches.  Endo/Heme/Allergies: Negative for environmental allergies.  Psychiatric/Behavioral: Negative for depression. The patient is not nervous/anxious.        Objective:    Physical Exam  Constitutional: He is oriented to person, place, and time. He appears well-developed and well-nourished. No distress.  HENT:  Head: Normocephalic and atraumatic.  Nose: Nose normal.  Eyes: Right eye exhibits no discharge. Left eye exhibits no discharge.  Neck: Normal range of motion. Neck supple.  Cardiovascular: Normal rate and regular rhythm.   No murmur heard. Pulmonary/Chest: Effort normal and breath sounds normal.  Abdominal: Soft. Bowel sounds are normal. There is no tenderness.  Musculoskeletal: He exhibits no edema.  Neurological: He is alert and oriented to person, place, and time.  Skin: Skin is warm and dry.  Psychiatric: He has a normal mood and affect.  Nursing note and vitals reviewed.   BP 132/86 mmHg  Pulse 77  Temp(Src) 98.1 F (36.7 C) (Oral)  Ht 6\' 2"  (1.88 m)  Wt 258 lb 4 oz (117.141 kg)  BMI 33.14 kg/m2  SpO2 96% Wt Readings from Last 3 Encounters:  04/16/16 258 lb 4 oz (117.141 kg)  03/16/16 254 lb 6 oz (115.384 kg)  03/10/16 257 lb (116.574 kg)     Lab Results  Component Value Date   WBC 10.3 04/16/2016   HGB 15.7 04/16/2016   HCT 45.5 04/16/2016   PLT 191.0 04/16/2016   GLUCOSE 383* 04/16/2016   CHOL 131 04/16/2016   TRIG 318.0* 04/16/2016   HDL 26.40* 04/16/2016   LDLDIRECT 68.0 04/16/2016   LDLCALC NOT CALC 05/15/2014   ALT 20 04/16/2016   AST 15 04/16/2016   NA 131*  04/16/2016   K 5.1 04/16/2016   CL 96 04/16/2016   CREATININE 1.26 04/16/2016   BUN 21 04/16/2016   CO2 28 04/16/2016   TSH 0.99 04/16/2016   PSA 0.35 03/09/2012   HGBA1C 11.8 03/10/2016   MICROALBUR 9.4* 02/24/2015    Lab Results  Component Value Date   TSH 0.99 04/16/2016   Lab Results  Component Value Date   WBC 10.3 04/16/2016   HGB 15.7 04/16/2016   HCT 45.5 04/16/2016   MCV 91.7 04/16/2016   PLT 191.0 04/16/2016   Lab Results  Component Value Date   NA 131* 04/16/2016   K 5.1 04/16/2016   CO2 28 04/16/2016   GLUCOSE 383* 04/16/2016   BUN 21 04/16/2016   CREATININE 1.26 04/16/2016   BILITOT 0.5 04/16/2016   ALKPHOS 104 04/16/2016   AST 15 04/16/2016   ALT 20 04/16/2016   PROT 7.2 04/16/2016   ALBUMIN 4.6 04/16/2016   CALCIUM 9.7 04/16/2016   GFR 61.40 04/16/2016   Lab Results  Component Value Date   CHOL 131 04/16/2016   Lab Results  Component Value Date   HDL 26.40* 04/16/2016   Lab Results  Component Value Date   LDLCALC NOT CALC 05/15/2014   Lab Results  Component Value Date   TRIG 318.0* 04/16/2016   Lab Results  Component Value Date   CHOLHDL 5 04/16/2016   Lab Results  Component Value Date   HGBA1C 11.8 03/10/2016       Assessment & Plan:   Problem List Items Addressed This Visit    HTN (hypertension)    Well controlled, no changes to meds. Encouraged heart healthy diet such as the DASH diet and exercise as tolerated.       Relevant Medications   furosemide (LASIX) 40 MG tablet   Other Relevant Orders   Comprehensive metabolic panel (Completed)  Lipid panel (Completed)   TSH (Completed)   CBC (Completed)   Obesity    Encouraged DASH diet, decrease po intake and increase exercise as tolerated. Needs 7-8 hours of sleep nightly. Avoid trans fats, eat small, frequent meals every 4-5 hours with lean proteins, complex carbs and healthy fats. Minimize simple carbs, GMO foods. Has a gastric bypass consultation next month       Tobacco abuse    Encouraged complete cessation. Discussed need to quit as relates to risk of numerous cancers, cardiac and pulmonary disease as well as neurologic complications. Counseled for greater than 3 minutes      Hyperlipidemia - Primary    Encouraged heart healthy diet, increase exercise, avoid trans fats, consider a krill oil cap daily      Relevant Medications   furosemide (LASIX) 40 MG tablet   Other Relevant Orders   Comprehensive metabolic panel (Completed)   Lipid panel (Completed)   TSH (Completed)   CBC (Completed)   Neck pain    L>R is locking intermittently, muscle relaxers give him some relief. Is considering going back to Dr Dutch QuintPoole to proceed with a nerve block which he has had relief from in past. Has been unable to work this week due to pain and dysfunction.       Relevant Orders   Comprehensive metabolic panel (Completed)   Lipid panel (Completed)   TSH (Completed)   CBC (Completed)   Diabetes mellitus type 2 in obese [facility](HCC)    Has established with endocrinology, is tolerating Metformin. Is checking sugars more and is watching his sugars more      Relevant Orders   Comprehensive metabolic panel (Completed)   Lipid panel (Completed)   TSH (Completed)   CBC (Completed)      I am having Gabriel [name] maintain his multivitamin, aspirin, Krill Oil, diclofenac, albuterol, omeprazole, metoCLOPramide, methocarbamol, Insulin Pen Needle, carvedilol, Probiotic Product (PROBIOTIC DAILY PO), Chromium Picolinate, amLODipine, nicotine, fluticasone, atorvastatin, lisinopril, triamterene-hydrochlorothiazide, Insulin Glargine, insulin lispro, glucose blood, cyclobenzaprine, HYDROcodone-acetaminophen, gemfibrozil, sertraline, metFORMIN, and furosemide.  Meds ordered this encounter  Medications  . furosemide (LASIX) 40 MG tablet    Sig: Take 0.5 tablets (20 mg total) by mouth daily.    Dispense:  45 tablet    Refill:  3    DISCONTINUE ALL PREVIOUS REFILLS FOR THIS  MEDICATION     Danise EdgeBLYTH, Athan Casalino, MD

## 2016-04-16 NOTE — Progress Notes (Signed)
Pre visit review using our clinic review tool, if applicable. No additional management support is needed unless otherwise documented below in the visit note. 

## 2016-04-19 ENCOUNTER — Other Ambulatory Visit: Payer: Self-pay | Admitting: Family Medicine

## 2016-04-19 DIAGNOSIS — E871 Hypo-osmolality and hyponatremia: Secondary | ICD-10-CM

## 2016-04-28 ENCOUNTER — Other Ambulatory Visit: Payer: Self-pay | Admitting: Family Medicine

## 2016-05-10 ENCOUNTER — Other Ambulatory Visit (INDEPENDENT_AMBULATORY_CARE_PROVIDER_SITE_OTHER): Payer: Commercial Managed Care - PPO

## 2016-05-10 DIAGNOSIS — E871 Hypo-osmolality and hyponatremia: Secondary | ICD-10-CM

## 2016-05-10 LAB — COMPREHENSIVE METABOLIC PANEL
ALT: 23 U/L (ref 0–53)
AST: 19 U/L (ref 0–37)
Albumin: 4.6 g/dL (ref 3.5–5.2)
Alkaline Phosphatase: 68 U/L (ref 39–117)
BUN: 22 mg/dL (ref 6–23)
CALCIUM: 9.7 mg/dL (ref 8.4–10.5)
CHLORIDE: 102 meq/L (ref 96–112)
CO2: 28 meq/L (ref 19–32)
Creatinine, Ser: 1.35 mg/dL (ref 0.40–1.50)
GFR: 56.68 mL/min — AB (ref >60.00)
Glucose, Bld: 85 mg/dL (ref 70–99)
POTASSIUM: 4.7 meq/L (ref 3.5–5.1)
Sodium: 136 mEq/L (ref 135–145)
Total Bilirubin: 0.4 mg/dL (ref 0.2–1.2)
Total Protein: 7 g/dL (ref 6.0–8.3)

## 2016-05-20 ENCOUNTER — Other Ambulatory Visit: Payer: Self-pay | Admitting: Family Medicine

## 2016-06-10 ENCOUNTER — Ambulatory Visit (INDEPENDENT_AMBULATORY_CARE_PROVIDER_SITE_OTHER): Payer: Commercial Managed Care - PPO | Admitting: Family Medicine

## 2016-06-10 ENCOUNTER — Encounter: Payer: Self-pay | Admitting: Family Medicine

## 2016-06-10 DIAGNOSIS — M542 Cervicalgia: Secondary | ICD-10-CM

## 2016-06-10 MED ORDER — CYCLOBENZAPRINE HCL 10 MG PO TABS: 10.0000 mg | ORAL_TABLET | Freq: Three times a day (TID) | ORAL | 0 refills | Status: DC | PRN

## 2016-06-10 NOTE — Patient Instructions (Addendum)
EXERCISES RANGE OF MOTION (ROM) AND STRETCHING EXERCISES - Cervical Strain and Sprain These exercises may help you when beginning to rehabilitate your injury. In order to successfully resolve your symptoms, you must improve your posture. These exercises are designed to help reduce the forward-head and rounded-shoulder posture which contributes to this condition. Your symptoms may resolve with or without further involvement from your physician, physical therapist or athletic trainer. While completing these exercises, remember:   Restoring tissue flexibility helps normal motion to return to the joints. This allows healthier, less painful movement and activity.  An effective stretch should be held for at least 20 seconds, although you may need to begin with shorter hold times for comfort.  A stretch should never be painful. You should only feel a gentle lengthening or release in the stretched tissue. STRETCH- Axial Extensors  Lie on your back on the floor. You may bend your knees for comfort. Place a rolled-up hand towel or dish towel, about 2 inches in diameter, under the part of your head that makes contact with the floor.  Gently tuck your chin, as if trying to make a "double chin," until you feel a gentle stretch at the base of your head.  Hold __________ seconds. Repeat __________ times. Complete this exercise __________ times per day.  STRETCH - Axial Extension   Stand or sit on a firm surface. Assume a good posture: chest up, shoulders drawn back, abdominal muscles slightly tense, knees unlocked (if standing) and feet hip width apart.  Slowly retract your chin so your head slides back and your chin slightly lowers. Continue to look straight ahead.  You should feel a gentle stretch in the back of your head. Be certain not to feel an aggressive stretch since this can cause headaches later.  Hold for __________ seconds. Repeat __________ times. Complete this exercise __________ times per  day. STRETCH - Cervical Side Bend   Stand or sit on a firm surface. Assume a good posture: chest up, shoulders drawn back, abdominal muscles slightly tense, knees unlocked (if standing) and feet hip width apart.  Without letting your nose or shoulders move, slowly tip your right / left ear to your shoulder until your feel a gentle stretch in the muscles on the opposite side of your neck.  Hold __________ seconds. Repeat __________ times. Complete this exercise __________ times per day. STRETCH - Cervical Rotators   Stand or sit on a firm surface. Assume a good posture: chest up, shoulders drawn back, abdominal muscles slightly tense, knees unlocked (if standing) and feet hip width apart.  Keeping your eyes level with the ground, slowly turn your head until you feel a gentle stretch along the back and opposite side of your neck.  Hold __________ seconds. Repeat __________ times. Complete this exercise __________ times per day. RANGE OF MOTION - Neck Circles   Stand or sit on a firm surface. Assume a good posture: chest up, shoulders drawn back, abdominal muscles slightly tense, knees unlocked (if standing) and feet hip width apart.  Gently roll your head down and around from the back of one shoulder to the back of the other. The motion should never be forced or painful.  Repeat the motion 10-20 times, or until you feel the neck muscles relax and loosen. Repeat __________ times. Complete the exercise __________ times per day. STRENGTHENING EXERCISES - Cervical Strain and Sprain These exercises may help you when beginning to rehabilitate your injury. They may resolve your symptoms with or without further involvement  from your physician, physical therapist, or athletic trainer. While completing these exercises, remember:   Muscles can gain both the endurance and the strength needed for everyday activities through controlled exercises.  Complete these exercises as instructed by your  physician, physical therapist, or athletic trainer. Progress the resistance and repetitions only as guided.  You may experience muscle soreness or fatigue, but the pain or discomfort you are trying to eliminate should never worsen during these exercises. If this pain does worsen, stop and make certain you are following the directions exactly. If the pain is still present after adjustments, discontinue the exercise until you can discuss the trouble with your clinician. STRENGTH - Cervical Flexors, Isometric  Face a wall, standing about 6 inches away. Place a small pillow, a ball about 6-8 inches in diameter, or a folded towel between your forehead and the wall.  Slightly tuck your chin and gently push your forehead into the soft object. Push only with mild to moderate intensity, building up tension gradually. Keep your jaw and forehead relaxed.  Hold 10 to 20 seconds. Keep your breathing relaxed.  Release the tension slowly. Relax your neck muscles completely before you start the next repetition. Repeat __________ times. Complete this exercise __________ times per day. STRENGTH- Cervical Lateral Flexors, Isometric   Stand about 6 inches away from a wall. Place a small pillow, a ball about 6-8 inches in diameter, or a folded towel between the side of your head and the wall.  Slightly tuck your chin and gently tilt your head into the soft object. Push only with mild to moderate intensity, building up tension gradually. Keep your jaw and forehead relaxed.  Hold 10 to 20 seconds. Keep your breathing relaxed.  Release the tension slowly. Relax your neck muscles completely before you start the next repetition. Repeat __________ times. Complete this exercise __________ times per day. STRENGTH - Cervical Extensors, Isometric   Stand about 6 inches away from a wall. Place a small pillow, a ball about 6-8 inches in diameter, or a folded towel between the back of your head and the wall.  Slightly tuck  your chin and gently tilt your head back into the soft object. Push only with mild to moderate intensity, building up tension gradually. Keep your jaw and forehead relaxed.  Hold 10 to 20 seconds. Keep your breathing relaxed.  Release the tension slowly. Relax your neck muscles completely before you start the next repetition. Repeat __________ times. Complete this exercise __________ times per day. POSTURE AND BODY MECHANICS CONSIDERATIONS - Cervical Strain and Sprain Keeping correct posture when sitting, standing or completing your activities will reduce the stress put on different body tissues, allowing injured tissues a chance to heal and limiting painful experiences. The following are general guidelines for improved posture. Your physician or physical therapist will provide you with any instructions specific to your needs. While reading these guidelines, remember:  The exercises prescribed by your provider will help you have the flexibility and strength to maintain correct postures.  The correct posture provides the optimal environment for your joints to work. All of your joints have less wear and tear when properly supported by a spine with good posture. This means you will experience a healthier, less painful body.  Correct posture must be practiced with all of your activities, especially prolonged sitting and standing. Correct posture is as important when doing repetitive low-stress activities (typing) as it is when doing a single heavy-load activity (lifting). PROLONGED STANDING WHILE SLIGHTLY LEANING FORWARD  When completing a task that requires you to lean forward while standing in one place for a long time, place either foot up on a stationary 2- to 4-inch high object to help maintain the best posture. When both feet are on the ground, the low back tends to lose its slight inward curve. If this curve flattens (or becomes too large), then the back and your other joints will experience too  much stress, fatigue more quickly, and can cause pain.  RESTING POSITIONS Consider which positions are most painful for you when choosing a resting position. If you have pain with flexion-based activities (sitting, bending, stooping, squatting), choose a position that allows you to rest in a less flexed posture. You would want to avoid curling into a fetal position on your side. If your pain worsens with extension-based activities (prolonged standing, working overhead), avoid resting in an extended position such as sleeping on your stomach. Most people will find more comfort when they rest with their spine in a more neutral position, neither too rounded nor too arched. Lying on a non-sagging bed on your side with a pillow between your knees, or on your back with a pillow under your knees will often provide some relief. Keep in mind, being in any one position for a prolonged period of time, no matter how correct your posture, can still lead to stiffness. WALKING Walk with an upright posture. Your ears, shoulders, and hips should all line up. OFFICE WORK When working at a desk, create an environment that supports good, upright posture. Without extra support, muscles fatigue and lead to excessive strain on joints and other tissues. CHAIR:  A chair should be able to slide under your desk when your back makes contact with the back of the chair. This allows you to work closely.  The chair's height should allow your eyes to be level with the upper part of your monitor and your hands to be slightly lower than your elbows.  Body position:  Your feet should make contact with the floor. If this is not possible, use a foot rest.  Keep your ears over your shoulders. This will reduce stress on your neck and low back.   This information is not intended to replace advice given to you by your health care provider. Make sure you discuss any questions you have with your health care provider.   Document Released:  10/04/2005 Document Revised: 10/25/2014 Document Reviewed: 01/16/2009 [employer] Interactive Patient Education [employer]2016 [employer]

## 2016-06-10 NOTE — Progress Notes (Signed)
Pre visit review using our clinic review tool, if applicable. No additional management support is needed unless otherwise documented below in the visit note. 

## 2016-06-10 NOTE — Progress Notes (Signed)
Chief Complaint  Patient presents with  . Neck Pain    Patient reports worsening neck pain x 2 days; some lower back pain as well; 8/10 (0-10 pain scale)    Subjective: Patient is a 65 y.o. male here for neck pain.  Pt has a hx of cervicalgia with radiographic evidence of a bulging disc at C4-5, C7-T1 neural foramen narrowing and inflammatory changes at C2-3. It has been worsening over the past 2 days. He has an appt with his neurosurgeon next week. Pain is worse on the L. No injury or change in activity. Flexeril has worked well in the past. He is not having any weakness, but is having some tingling in his left arm, which is similar to how things presented several months ago, prior to his initial injection that helped.  ROS: Msk: +Neck pain Neuro: +tingling in L arm, no weakness  Family History  Problem Relation Age of Onset  . Arthritis      mother/father/paternal grandparents  . Heart disease Father     pacemaker  . Hypertension Father   . Hypertension Paternal Grandfather   . Hypertension Brother   . Emotional abuse Mother   . Diabetes Brother      x 2  . Diabetes Paternal Grandmother    Past Medical History:  Diagnosis Date  . Abdominal bloating 09/12/2015  . Allergic state 02/08/2013  . Aortic stenosis   . Arthritis   . Congestive heart failure (HCC)    September 2011-CHF  . Depression   . Diabetes mellitus type 2 in obese (HCC) 04/16/2016  . Diabetes mellitus, type 2 (HCC)   . History of chicken pox   . History of chronic bronchitis    dx 2011  . History of stroke 1992   blood clot at base of brain-[facility]  . Hyperlipidemia   . Hypertension   . Obesity, unspecified 11/27/2012  . Preventative health care 03/08/2015  . Seasonal allergies   . Tobacco abuse 11/27/2012   Down from 3 ppd to 1/2 ppd  . Unspecified hereditary and idiopathic peripheral neuropathy 05/26/2014   Tingling in toes   No Known Allergies  Current Outpatient Prescriptions:  .   albuterol (PROVENTIL HFA;VENTOLIN HFA) 108 (90 BASE) MCG/ACT inhaler, Inhale 2 puffs into the lungs every 6 (six) hours as needed for wheezing., Disp: 1 Inhaler, Rfl: 1 .  amLODipine (NORVASC) 10 MG tablet, Take 1 tablet (10 mg total) by mouth daily., Disp: 90 tablet, Rfl: 1 .  aspirin 81 MG tablet, Take 81 mg by mouth daily.  , Disp: , Rfl:  .  atorvastatin (LIPITOR) 40 MG tablet, Take 1.5 tablets daily, Disp: , Rfl:  .  carvedilol (COREG) 25 MG tablet, TAKE ONE AND ONE-HALF TABLETS TWICE A DAY, Disp: 270 tablet, Rfl: 1 .  Chromium Picolinate 500 MCG TABS, Take 500 mcg by mouth daily., Disp: , Rfl:  .  cyclobenzaprine (FLEXERIL) 10 MG tablet, Take 1 tablet (10 mg total) by mouth 3 (three) times daily as needed for muscle spasms., Disp: 30 tablet, Rfl: 0 .  diclofenac (VOLTAREN) 75 MG EC tablet, Take 1 tablet (75 mg total) by mouth 2 (two) times daily., Disp: 30 tablet, Rfl: 0 .  fluticasone (FLONASE) 50 MCG/ACT nasal spray, Place 2 sprays into both nostrils daily as needed for rhinitis or allergies., Disp: 16 g, Rfl: 6 .  furosemide (LASIX) 40 MG tablet, Take 0.5 tablets (20 mg total) by mouth daily., Disp: 45 tablet, Rfl: 3 .  gemfibrozil (LOPID) 600 MG tablet, TAKE 1 TABLET TWICE A DAY BEFORE MEALS, Disp: 180 tablet, Rfl: 0 .  glucose blood (ONETOUCH VERIO) test strip, 1 each by Other route 2 (two) times daily. And lancets 2/day, Disp: 180 each, Rfl: 12 .  HYDROcodone-acetaminophen (NORCO) 5-325 MG tablet, Take 1 tablet by mouth every 6 (six) hours as needed for moderate pain., Disp: 30 tablet, Rfl: 0 .  Insulin Glargine (LANTUS SOLOSTAR) 100 UNIT/ML Solostar Pen, Inject 220 Units into the skin daily., Disp: 65 pen, Rfl: PRN .  insulin lispro (HUMALOG KWIKPEN) 100 UNIT/ML KiwkPen, Inject 0.4 mLs (40 Units total) into the skin daily with breakfast., Disp: 15 mL, Rfl: 2 .  Insulin Pen Needle 31G X 5 MM MISC, USE AS DIRECTED ONE TIME DAILY., Disp: 150 each, Rfl: 1 .  Krill Oil CAPS, MegaRed or a  generic by schiff daily, Disp: , Rfl:  .  lisinopril (PRINIVIL,ZESTRIL) 40 MG tablet, Take 1 tablet (40 mg total) by mouth daily., Disp: 90 tablet, Rfl: 1 .  metFORMIN (GLUCOPHAGE) 500 MG tablet, Take 2 tablets (1,000 mg total) by mouth daily., Disp: 180 tablet, Rfl: 3 .  methocarbamol (ROBAXIN) 500 MG tablet, TAKE ONE TABLET BY MOUTH TWICE DAILY AS NEEDED FOR PAIN OR MUSCLE SPASM, Disp: 60 tablet, Rfl: 1 .  metoCLOPramide (REGLAN) 10 MG tablet, Take 1 tablet (10 mg total) by mouth 3 (three) times daily before meals., Disp: 30 tablet, Rfl: 0 .  Multiple Vitamin (MULTIVITAMIN) tablet, Take 1 tablet by mouth daily.  , Disp: , Rfl:  .  nicotine (NICODERM CQ - DOSED IN MG/24 HR) 7 mg/24hr patch, Place 1 patch (7 mg total) onto the skin daily., Disp: 28 patch, Rfl: 0 .  omeprazole (PRILOSEC) 20 MG capsule, Take 1 capsule (20 mg total) by mouth daily., Disp: 30 capsule, Rfl: 3 .  Probiotic Product (PROBIOTIC DAILY PO), Take 1 capsule by mouth daily., Disp: , Rfl:  .  sertraline (ZOLOFT) 100 MG tablet, TAKE ONE AND ONE-HALF TABLETS DAILY, Disp: 135 tablet, Rfl: 1 .  triamterene-hydrochlorothiazide (MAXZIDE-25) 37.5-25 MG tablet, TAKE 1 TABLET DAILY, Disp: 90 tablet, Rfl: 1  Objective: BP 118/69 (BP Location: Left Arm)   Pulse 88   Temp 98.3 F (36.8 C) (Oral)   Ht 6\' 2"  (1.88 m)   Wt 264 lb (119.7 kg)   SpO2 97%   BMI 33.90 kg/m  General: Awake, appears stated age Msk: TTP over cervical paraspinal musculature. Decreased ROM. Poor posture overall. Heart: RRR, musical SEM heard loudest at pulmonic listening post. Lungs: CTAB, no rales, wheezes or rhonchi. Normal effort Psych: Age appropriate judgment and insight, normal affect and mood; slightly forgetful.  Assessment and Plan: Cervicalgia - Plan: cyclobenzaprine (FLEXERIL) 10 MG tablet  Orders as above. I attempted to do OMM, but he was too tender. He has done well with this in the past. Keep appt with NS on 8/29. Stretches and exercises  were given if he can tolerate them.  Letter for work given excusing him for today and tomorrow. F/u prn.  The patient voiced understanding and agreement to the plan.  Jilda RocheNicholas Paul [place]

## 2016-06-16 ENCOUNTER — Other Ambulatory Visit: Payer: Self-pay | Admitting: Family Medicine

## 2016-06-22 ENCOUNTER — Other Ambulatory Visit: Payer: Self-pay | Admitting: Family Medicine

## 2016-07-02 ENCOUNTER — Ambulatory Visit: Payer: Self-pay | Admitting: Endocrinology

## 2016-07-09 ENCOUNTER — Other Ambulatory Visit: Payer: Self-pay | Admitting: Family Medicine

## 2016-07-12 ENCOUNTER — Encounter: Payer: Self-pay | Admitting: Endocrinology

## 2016-07-12 ENCOUNTER — Ambulatory Visit (INDEPENDENT_AMBULATORY_CARE_PROVIDER_SITE_OTHER): Payer: Commercial Managed Care - PPO | Admitting: Endocrinology

## 2016-07-12 VITALS — BP 118/78 | HR 88 | Ht 74.0 in | Wt 266.0 lb

## 2016-07-12 DIAGNOSIS — E669 Obesity, unspecified: Secondary | ICD-10-CM | POA: Diagnosis not present

## 2016-07-12 DIAGNOSIS — E119 Type 2 diabetes mellitus without complications: Secondary | ICD-10-CM | POA: Diagnosis not present

## 2016-07-12 DIAGNOSIS — E1169 Type 2 diabetes mellitus with other specified complication: Secondary | ICD-10-CM

## 2016-07-12 LAB — POCT GLYCOSYLATED HEMOGLOBIN (HGB A1C): Hemoglobin A1C: 8.1

## 2016-07-12 MED ORDER — INSULIN GLARGINE 100 UNIT/ML SOLOSTAR PEN: 240.0000 [IU] | PEN_INJECTOR | Freq: Every day | SUBCUTANEOUS | 99 refills | Status: DC

## 2016-07-12 NOTE — Patient Instructions (Addendum)
check your blood sugar twice a day.  vary the time of day when you check, between before the 3 meals, and at bedtime.  also check if you have symptoms of your blood sugar being too high or too low.  please keep a record of the readings and bring it to your next appointment here.  You can write it on any piece of paper.  please call us sooner if your blood sugar goes below 70, or if you have a lot of readings over 200.  Please come back for a follow-up appointment in 2 months.    On this type of insulin schedule, you should eat meals on a regular schedule.  If a meal is missed or significantly delayed, your blood sugar could go low. If necessary, carry a non-perishable snack, such as crackers.   Please increase the lantus to 240 units daily, and:  Please continue the same humalog.

## 2016-07-12 NOTE — Progress Notes (Signed)
Subjective:    Patient ID: Gabriel [name], male    DOB: Dec 19, 1950, 65 y.o.   MRN: [id number]  HPI Pt returns for f/u of diabetes mellitus: DM type: Insulin-requiring type 2 Dx'ed: 1994 Complications: CVA, polyneuropathy, and renal insufficiency.  Therapy: insulin since 2000 DKA: never Severe hypoglycemia: never.  Pancreatitis: never   Other: he works 3rd shift. Interval history: He was changed to lantus alone, due to poor results with multiple daily injections.  Meter is downloaded today, and the printout is scanned into the record.  It varies from 140-320.  There is no trend throughout the day.  He says he never misses the insulin Past Medical History:  Diagnosis Date  . Abdominal bloating 09/12/2015  . Allergic state 02/08/2013  . Aortic stenosis   . Arthritis   . Congestive heart failure (HCC)    September 2011-CHF  . Depression   . Diabetes mellitus type 2 in obese (HCC) 04/16/2016  . Diabetes mellitus, type 2 (HCC)   . History of chicken pox   . History of chronic bronchitis    dx 2011  . History of stroke 1992   blood clot at base of brain-[facility]  . Hyperlipidemia   . Hypertension   . Obesity, unspecified 11/27/2012  . Preventative health care 03/08/2015  . Seasonal allergies   . Tobacco abuse 11/27/2012   Down from 3 ppd to 1/2 ppd  . Unspecified hereditary and idiopathic peripheral neuropathy 05/26/2014   Tingling in toes    Past Surgical History:  Procedure Laterality Date  . KNEE SURGERY  1976, 1989   right knee torn ligament repair  . LUMBAR FUSION  1982   L4-L5  . REVISION TOTAL KNEE ARTHROPLASTY  2007   right knee replacement    Social History   Social History  . Marital status: Widowed    Spouse name: N/A  . Number of children: 2  . Years of education: 16   Occupational History  . Security  [employer]   Social History Main Topics  . Smoking status: Current Some Day Smoker    Packs/day: 1.00  . Smokeless tobacco: Never Used      Comment: smokes a pack per day-started in 1972, trying to stop smokes 10 per day.  . Alcohol use No  . Drug use: No  . Sexual activity: No   Other Topics Concern  . Not on file   Social History Narrative   Regular exercise-no   Caffeine Use-yes          Current Outpatient Prescriptions on File Prior to Visit  Medication Sig Dispense Refill  . albuterol (PROVENTIL HFA;VENTOLIN HFA) 108 (90 BASE) MCG/ACT inhaler Inhale 2 puffs into the lungs every 6 (six) hours as needed for wheezing. 1 Inhaler 1  . amLODipine (NORVASC) 10 MG tablet Take 1 tablet (10 mg total) by mouth daily. 90 tablet 1  . aspirin 81 MG tablet Take 81 mg by mouth daily.      Marland Kitchen. atorvastatin (LIPITOR) 40 MG tablet TAKE 1 TABLET DAILY 90 tablet 1  . carvedilol (COREG) 25 MG tablet TAKE ONE AND ONE-HALF TABLETS TWICE A DAY 270 tablet 1  . Chromium Picolinate 500 MCG TABS Take 500 mcg by mouth daily.    . cyclobenzaprine (FLEXERIL) 10 MG tablet Take 1 tablet (10 mg total) by mouth 3 (three) times daily as needed for muscle spasms. 30 tablet 0  . diclofenac (VOLTAREN) 75 MG EC tablet Take 1 tablet (75 mg  total) by mouth 2 (two) times daily. 30 tablet 0  . fluticasone (FLONASE) 50 MCG/ACT nasal spray Place 2 sprays into both nostrils daily as needed for rhinitis or allergies. 16 g 6  . furosemide (LASIX) 40 MG tablet Take 0.5 tablets (20 mg total) by mouth daily. 45 tablet 3  . gemfibrozil (LOPID) 600 MG tablet TAKE 1 TABLET TWICE A DAY BEFORE MEALS 180 tablet 0  . glucose blood (ONETOUCH VERIO) test strip 1 each by Other route 2 (two) times daily. And lancets 2/day 180 each 12  . HYDROcodone-acetaminophen (NORCO) 5-325 MG tablet Take 1 tablet by mouth every 6 (six) hours as needed for moderate pain. 30 tablet 0  . insulin lispro (HUMALOG KWIKPEN) 100 UNIT/ML KiwkPen Inject 0.4 mLs (40 Units total) into the skin daily with breakfast. 15 mL 2  . Insulin Pen Needle 31G X 5 MM MISC USE AS DIRECTED ONE TIME DAILY. 150 each 1    . Krill Oil CAPS MegaRed or a generic by schiff daily    . lisinopril (PRINIVIL,ZESTRIL) 40 MG tablet Take 1 tablet (40 mg total) by mouth daily. 90 tablet 1  . metFORMIN (GLUCOPHAGE) 500 MG tablet Take 2 tablets (1,000 mg total) by mouth daily. 180 tablet 3  . methocarbamol (ROBAXIN) 500 MG tablet TAKE ONE TABLET BY MOUTH TWICE DAILY AS NEEDED FOR PAIN OR MUSCLE SPASM 60 tablet 1  . metoCLOPramide (REGLAN) 10 MG tablet Take 1 tablet (10 mg total) by mouth 3 (three) times daily before meals. 30 tablet 0  . Multiple Vitamin (MULTIVITAMIN) tablet Take 1 tablet by mouth daily.      . nicotine (NICODERM CQ - DOSED IN MG/24 HR) 7 mg/24hr patch Place 1 patch (7 mg total) onto the skin daily. 28 patch 0  . omeprazole (PRILOSEC) 20 MG capsule Take 1 capsule (20 mg total) by mouth daily. 30 capsule 3  . Probiotic Product (PROBIOTIC DAILY PO) Take 1 capsule by mouth daily.    . sertraline (ZOLOFT) 100 MG tablet TAKE ONE AND ONE-HALF TABLETS DAILY 135 tablet 1  . triamterene-hydrochlorothiazide (MAXZIDE-25) 37.5-25 MG tablet TAKE 1 TABLET DAILY 90 tablet 1   No current facility-administered medications on file prior to visit.     No Known Allergies  Family History  Problem Relation Age of Onset  . Arthritis      mother/father/paternal grandparents  . Heart disease Father     pacemaker  . Hypertension Father   . Hypertension Paternal Grandfather   . Hypertension Brother   . Emotional abuse Mother   . Diabetes Brother      x 2  . Diabetes Paternal Grandmother     BP 118/78   Pulse 88   Ht 6\' 2"  (1.88 m)   Wt 266 lb (120.7 kg)   SpO2 96%   BMI 34.15 kg/m    Review of Systems He denies hypoglycemia    Objective:   Physical Exam VITAL SIGNS:  See vs page GENERAL: no distress Pulses: dorsalis pedis intact bilat.  MSK: no deformity of the feet CV: no leg edema Skin: no ulcer on the feet. normal temp on the feet. There is patchy hyperpigmentation of the feet.  Neuro: sensation  is intact to touch on the feet, but decreased from normal.    A1c=8.1%    Assessment & Plan:  Insulin-requiring type 2 DM: he needs increased rx

## 2016-07-13 ENCOUNTER — Other Ambulatory Visit: Payer: Self-pay | Admitting: Family Medicine

## 2016-07-14 ENCOUNTER — Other Ambulatory Visit: Payer: Self-pay | Admitting: Neurosurgery

## 2016-07-14 DIAGNOSIS — M47812 Spondylosis without myelopathy or radiculopathy, cervical region: Secondary | ICD-10-CM

## 2016-07-14 DIAGNOSIS — M48061 Spinal stenosis, lumbar region without neurogenic claudication: Secondary | ICD-10-CM

## 2016-07-20 ENCOUNTER — Ambulatory Visit: Payer: Self-pay | Admitting: Family Medicine

## 2016-07-29 ENCOUNTER — Other Ambulatory Visit: Payer: Self-pay

## 2016-08-10 ENCOUNTER — Encounter: Payer: Self-pay | Admitting: Physician Assistant

## 2016-08-10 ENCOUNTER — Ambulatory Visit (INDEPENDENT_AMBULATORY_CARE_PROVIDER_SITE_OTHER): Payer: Commercial Managed Care - PPO | Admitting: Physician Assistant

## 2016-08-10 VITALS — BP 128/72 | HR 88 | Temp 98.3°F | Resp 16 | Ht 74.0 in | Wt 259.2 lb

## 2016-08-10 DIAGNOSIS — Z23 Encounter for immunization: Secondary | ICD-10-CM

## 2016-08-10 DIAGNOSIS — M5441 Lumbago with sciatica, right side: Secondary | ICD-10-CM | POA: Diagnosis not present

## 2016-08-10 MED ORDER — HYDROCODONE-ACETAMINOPHEN 5-325 MG PO TABS: 1.0000 | ORAL_TABLET | Freq: Three times a day (TID) | ORAL | 0 refills | Status: DC | PRN

## 2016-08-10 MED ORDER — CYCLOBENZAPRINE HCL 10 MG PO TABS: 10.0000 mg | ORAL_TABLET | Freq: Three times a day (TID) | ORAL | 0 refills | Status: DC | PRN

## 2016-08-10 NOTE — Patient Instructions (Signed)
No heavy lifting or overexertion. Take Flexeril as directed -- do not drive or operate heavy machinery when on this medication. Use norco for severe pain. As improving, switch to Tylenol.  Follow-up with your specialist Thursday as scheduled.

## 2016-08-10 NOTE — Progress Notes (Signed)
Pre visit review using our clinic review tool, if applicable. No additional management support is needed unless otherwise documented below in the visit note/SLS  

## 2016-08-10 NOTE — Progress Notes (Signed)
Patient with history of chronic cervicalgia and lumbar back pain with acute flares, presents to clinic today complaining of exacerbation of lumbago over last week. Patient endorses pain is R sided with radiation into RLE without numbness or weakness. Denies saddle anesthesia or change to bowel/bladder habits. Has not taken anything for symptoms. Denies trauma or injury but notes some episodes of manual labor prior to exacerbation of symptoms. Patient is scheduled for a Myelogram Thursday.  Past Medical History:  Diagnosis Date  . Abdominal bloating 09/12/2015  . Allergic state 02/08/2013  . Aortic stenosis   . Arthritis   . Congestive heart failure (HCC)    September 2011-CHF  . Depression   . Diabetes mellitus type 2 in obese (HCC) 04/16/2016  . Diabetes mellitus, type 2 (HCC)   . History of chicken pox   . History of chronic bronchitis    dx 2011  . History of stroke 1992   blood clot at base of brain-[facility]  . Hyperlipidemia   . Hypertension   . Obesity, unspecified 11/27/2012  . Preventative health care 03/08/2015  . Seasonal allergies   . Tobacco abuse 11/27/2012   Down from 3 ppd to 1/2 ppd  . Unspecified hereditary and idiopathic peripheral neuropathy 05/26/2014   Tingling in toes    Current Outpatient Prescriptions on File Prior to Visit  Medication Sig Dispense Refill  . albuterol (PROVENTIL HFA;VENTOLIN HFA) 108 (90 BASE) MCG/ACT inhaler Inhale 2 puffs into the lungs every 6 (six) hours as needed for wheezing. 1 Inhaler 1  . amLODipine (NORVASC) 10 MG tablet Take 1 tablet (10 mg total) by mouth daily. 90 tablet 1  . aspirin 81 MG tablet Take 81 mg by mouth daily.      Marland Kitchen atorvastatin (LIPITOR) 40 MG tablet TAKE 1 TABLET DAILY 90 tablet 1  . carvedilol (COREG) 25 MG tablet TAKE ONE AND ONE-HALF TABLETS TWICE A DAY 270 tablet 1  . Chromium Picolinate 500 MCG TABS Take 500 mcg by mouth daily.    . cyclobenzaprine (FLEXERIL) 10 MG tablet Take 1 tablet (10 mg total)  by mouth 3 (three) times daily as needed for muscle spasms. 30 tablet 0  . diclofenac (VOLTAREN) 75 MG EC tablet Take 1 tablet (75 mg total) by mouth 2 (two) times daily. 30 tablet 0  . fluticasone (FLONASE) 50 MCG/ACT nasal spray Place 2 sprays into both nostrils daily as needed for rhinitis or allergies. 16 g 6  . furosemide (LASIX) 40 MG tablet Take 0.5 tablets (20 mg total) by mouth daily. 45 tablet 3  . gemfibrozil (LOPID) 600 MG tablet TAKE 1 TABLET TWICE A DAY BEFORE MEALS 180 tablet 0  . glucose blood (ONETOUCH VERIO) test strip 1 each by Other route 2 (two) times daily. And lancets 2/day 180 each 12  . Insulin Glargine (LANTUS SOLOSTAR) 100 UNIT/ML Solostar Pen Inject 240 Units into the skin daily. 90 pen PRN  . insulin lispro (HUMALOG KWIKPEN) 100 UNIT/ML KiwkPen Inject 0.4 mLs (40 Units total) into the skin daily with breakfast. 15 mL 2  . Insulin Pen Needle 31G X 5 MM MISC USE AS DIRECTED ONE TIME DAILY. 150 each 1  . Krill Oil CAPS MegaRed or a generic by schiff daily    . lisinopril (PRINIVIL,ZESTRIL) 40 MG tablet Take 1 tablet (40 mg total) by mouth daily. 90 tablet 1  . metFORMIN (GLUCOPHAGE) 500 MG tablet Take 2 tablets (1,000 mg total) by mouth daily. 180 tablet 3  .  methocarbamol (ROBAXIN) 500 MG tablet TAKE ONE TABLET BY MOUTH TWICE DAILY AS NEEDED FOR PAIN OR MUSCLE SPASM 60 tablet 1  . metoCLOPramide (REGLAN) 10 MG tablet Take 1 tablet (10 mg total) by mouth 3 (three) times daily before meals. 30 tablet 0  . Multiple Vitamin (MULTIVITAMIN) tablet Take 1 tablet by mouth daily.      . nicotine (NICODERM CQ - DOSED IN MG/24 HR) 7 mg/24hr patch Place 1 patch (7 mg total) onto the skin daily. 28 patch 0  . Probiotic Product (PROBIOTIC DAILY PO) Take 1 capsule by mouth daily.    . sertraline (ZOLOFT) 100 MG tablet TAKE ONE AND ONE-HALF TABLETS DAILY 135 tablet 1  . triamterene-hydrochlorothiazide (MAXZIDE-25) 37.5-25 MG tablet TAKE 1 TABLET DAILY 90 tablet 1   No current  facility-administered medications on file prior to visit.     No Known Allergies  Family History  Problem Relation Age of Onset  . Arthritis      mother/father/paternal grandparents  . Heart disease Father     pacemaker  . Hypertension Father   . Hypertension Paternal Grandfather   . Hypertension Brother   . Emotional abuse Mother   . Diabetes Brother      x 2  . Diabetes Paternal Grandmother     Social History   Social History  . Marital status: Widowed    Spouse name: N/A  . Number of children: 2  . Years of education: 16   Occupational History  . Security  [employer]   Social History Main Topics  . Smoking status: Current Some Day Smoker    Packs/day: 1.00  . Smokeless tobacco: Never Used     Comment: smokes a pack per day-started in 1972, trying to stop smokes 10 per day.  . Alcohol use No  . Drug use: No  . Sexual activity: No   Other Topics Concern  . Not on file   Social History Narrative   Regular exercise-no   Caffeine Use-yes          Review of Systems - See HPI.  All other ROS are negative.  There were no vitals taken for this visit.  Physical Exam  Constitutional: He is oriented to person, place, and time and well-developed, well-nourished, and in no distress.  HENT:  Head: Normocephalic and atraumatic.  Eyes: Conjunctivae are normal.  Neck: Neck supple.  Cardiovascular: Normal rate, regular rhythm, normal heart sounds and intact distal pulses.   Pulmonary/Chest: Effort normal and breath sounds normal. No respiratory distress. He has no wheezes. He has no rales. He exhibits no tenderness.  Musculoskeletal:       Lumbar back: He exhibits tenderness, pain and spasm. He exhibits no bony tenderness.  Neurological: He is alert and oriented to person, place, and time.  Skin: Skin is warm and dry. No rash noted.  Psychiatric: Affect normal.  Vitals reviewed.  Recent Results (from the past 2160 hour(s))  POCT glycosylated hemoglobin (Hb  A1C)     Status: None   Collection Time: 07/12/16  9:46 AM  Result Value Ref Range   Hemoglobin A1C 8.1     Assessment/Plan: 1. Acute right-sided low back pain with right-sided sciatica Without alarm signs/symptoms. Will Rx Flexeril and Norco short term. Supportive measures discussed. FU with specialist this week as scheduled. - cyclobenzaprine (FLEXERIL) 10 MG tablet; Take 1 tablet (10 mg total) by mouth 3 (three) times daily as needed for muscle spasms.  Dispense: 30 tablet; Refill: 0 -  HYDROcodone-acetaminophen (NORCO/VICODIN) 5-325 MG tablet; Take 1 tablet by mouth every 8 (eight) hours as needed for moderate pain.  Dispense: 30 tablet; Refill: 0   Piedad Climes, [place]

## 2016-08-12 ENCOUNTER — Ambulatory Visit
Admission: RE | Admit: 2016-08-12 | Discharge: 2016-08-12 | Disposition: A | Discharge status: Home / Self Care | Payer: Commercial Managed Care - PPO | Source: Ambulatory Visit | Attending: Neurosurgery | Admitting: Neurosurgery

## 2016-08-12 VITALS — BP 136/71 | HR 70

## 2016-08-12 DIAGNOSIS — M48061 Spinal stenosis, lumbar region without neurogenic claudication: Secondary | ICD-10-CM

## 2016-08-12 DIAGNOSIS — M47812 Spondylosis without myelopathy or radiculopathy, cervical region: Secondary | ICD-10-CM

## 2016-08-12 MED ORDER — IOPAMIDOL (ISOVUE-M 300) INJECTION 61%
10.0000 mL | Freq: Once | INTRAMUSCULAR | Status: AC | PRN
  Administered 2016-08-12: 10 mL via INTRATHECAL

## 2016-08-12 MED ORDER — DIAZEPAM 5 MG PO TABS
10.0000 mg | ORAL_TABLET | Freq: Once | ORAL | Status: AC
  Administered 2016-08-12: 10 mg via ORAL

## 2016-08-12 NOTE — Progress Notes (Signed)
Pt states he has been off Zoloft for the past 2 days. He no longer takes Reglan.

## 2016-08-12 NOTE — Discharge Instructions (Signed)
Myelogram Discharge Instructions  1. Go home and rest quietly for the next 24 hours.  It is important to lie flat for the next 24 hours.  Get up only to go to the restroom.  You may lie in the bed or on a couch on your back, your stomach, your left side or your right side.  You may have one pillow under your head.  You may have pillows between your knees while you are on your side or under your knees while you are on your back.  2. DO NOT drive today.  Recline the seat as far back as it will go, while still wearing your seat belt, on the way home.  3. You may get up to go to the bathroom as needed.  You may sit up for 10 minutes to eat.  You may resume your normal diet and medications unless otherwise indicated.  Drink lots of extra fluids today and tomorrow.  4. The incidence of headache, nausea, or vomiting is about 5% (one in 20 patients).  If you develop a headache, lie flat and drink plenty of fluids until the headache goes away.  Caffeinated beverages may be helpful.  If you develop severe nausea and vomiting or a headache that does not go away with flat bed rest, call [phone].  5. You may resume normal activities after your 24 hours of bed rest is over; however, do not exert yourself strongly or do any heavy lifting tomorrow. If when you get up you have a headache when standing, go back to bed and force fluids for another 24 hours.  6. Call your physician for a follow-up appointment.  The results of your myelogram will be sent directly to your physician by the following day.  7. If you have any questions or if complications develop after you arrive home, please call [phone].  Discharge instructions have been explained to the patient.  The patient, or the person responsible for the patient, fully understands these instructions.       May resume Zoloft and Reglan on Oct. 27, 2017, after 9:30 am.

## 2016-08-16 ENCOUNTER — Telehealth: Payer: Self-pay

## 2016-08-16 NOTE — Telephone Encounter (Signed)
Spoke with patient after his myelogram here 08/12/16.  He states he had no problems and never got a headache. jkl

## 2016-09-02 ENCOUNTER — Encounter: Payer: Self-pay | Admitting: Family Medicine

## 2016-09-02 ENCOUNTER — Ambulatory Visit (INDEPENDENT_AMBULATORY_CARE_PROVIDER_SITE_OTHER): Payer: Commercial Managed Care - PPO | Admitting: Family Medicine

## 2016-09-02 DIAGNOSIS — M542 Cervicalgia: Secondary | ICD-10-CM

## 2016-09-02 DIAGNOSIS — I10 Essential (primary) hypertension: Secondary | ICD-10-CM

## 2016-09-02 DIAGNOSIS — E782 Mixed hyperlipidemia: Secondary | ICD-10-CM | POA: Diagnosis not present

## 2016-09-02 DIAGNOSIS — E669 Obesity, unspecified: Secondary | ICD-10-CM | POA: Diagnosis not present

## 2016-09-02 DIAGNOSIS — E1169 Type 2 diabetes mellitus with other specified complication: Secondary | ICD-10-CM

## 2016-09-02 DIAGNOSIS — J069 Acute upper respiratory infection, unspecified: Secondary | ICD-10-CM | POA: Insufficient documentation

## 2016-09-02 DIAGNOSIS — Z720 Tobacco use: Secondary | ICD-10-CM

## 2016-09-02 DIAGNOSIS — E6609 Other obesity due to excess calories: Secondary | ICD-10-CM | POA: Diagnosis not present

## 2016-09-02 LAB — CBC
HCT: 41.6 % (ref 39.0–52.0)
Hemoglobin: 14.1 g/dL (ref 13.0–17.0)
MCHC: 33.9 g/dL (ref 30.0–36.0)
MCV: 92.5 fl (ref 78.0–100.0)
PLATELETS: 212 10*3/uL (ref 150.0–400.0)
RBC: 4.5 Mil/uL (ref 4.22–5.81)
RDW: 13.6 % (ref 11.5–15.5)
WBC: 12 10*3/uL — AB (ref 4.0–10.5)

## 2016-09-02 LAB — COMPREHENSIVE METABOLIC PANEL
ALK PHOS: 70 U/L (ref 39–117)
ALT: 19 U/L (ref 0–53)
AST: 13 U/L (ref 0–37)
Albumin: 4.5 g/dL (ref 3.5–5.2)
BILIRUBIN TOTAL: 0.4 mg/dL (ref 0.2–1.2)
BUN: 22 mg/dL (ref 6–23)
CO2: 29 meq/L (ref 19–32)
CREATININE: 1.49 mg/dL (ref 0.40–1.50)
Calcium: 9.5 mg/dL (ref 8.4–10.5)
Chloride: 98 mEq/L (ref 96–112)
GFR: 50.53 mL/min — ABNORMAL LOW (ref >60.00)
GLUCOSE: 213 mg/dL — AB (ref 70–99)
Potassium: 5 mEq/L (ref 3.5–5.1)
Sodium: 133 mEq/L — ABNORMAL LOW (ref 135–145)
TOTAL PROTEIN: 7.2 g/dL (ref 6.0–8.3)

## 2016-09-02 LAB — LIPID PANEL
Cholesterol: 175 mg/dL (ref 0–200)
HDL: 27.5 mg/dL — ABNORMAL LOW (ref >39.00)
NONHDL: 147.32
Total CHOL/HDL Ratio: 6
Triglycerides: 333 mg/dL — ABNORMAL HIGH (ref 0.0–149.0)
VLDL: 66.6 mg/dL — ABNORMAL HIGH (ref 0.0–40.0)

## 2016-09-02 LAB — TSH: TSH: 0.86 u[IU]/mL (ref 0.35–4.50)

## 2016-09-02 LAB — LDL CHOLESTEROL, DIRECT: Direct LDL: 99 mg/dL

## 2016-09-02 NOTE — Assessment & Plan Note (Signed)
Encouraged increased rest and hydration, add probiotics, zinc such as Coldeze or Xicam. Treat fevers as needed. Use albuterol prn. Call if symptoms worsen

## 2016-09-02 NOTE — Assessment & Plan Note (Addendum)
Has an appt Dr Dutch QuintPoole to follow up on his recent CT myelogram results which shows progression of degeneration. Try adding Lidocaine patches daily and see if that helps at work

## 2016-09-02 NOTE — Assessment & Plan Note (Signed)
Tolerating statin, encouraged heart healthy diet, avoid trans fats, minimize simple carbs and saturated fats. Increase exercise as tolerated 

## 2016-09-02 NOTE — Progress Notes (Signed)
Patient ID: Gabriel [name], male   DOB: [birth year], 65 y.o.   MRN: [id number]   Subjective:    Patient ID: Gabriel [name], male    DOB: 1951-01-28, 65 y.o.   MRN: [id number]  Chief Complaint  Patient presents with  . Follow-up    HPI Patient is in today for follow up pt c/o of mild congestion, back pain, no fevers or chills and symptoms are improving. No chest congestion. No ear pain or sore throat. Is not noting any chest pain or rhinorrhea. Notes some headache and malaise. Denies CP/palp/SOB/HA/fevers/GI or GU c/o. Taking meds as prescribed  Past Medical History:  Diagnosis Date  . Abdominal bloating 09/12/2015  . Acute upper respiratory infection 09/02/2016  . Allergic state 02/08/2013  . Aortic stenosis   . Arthritis   . Congestive heart failure (HCC)    September 2011-CHF  . Depression   . Diabetes mellitus type 2 in obese (HCC) 04/16/2016  . Diabetes mellitus, type 2 (HCC)   . History of chicken pox   . History of chronic bronchitis    dx 2011  . History of stroke 1992   blood clot at base of brain-[facility]  . Hyperlipidemia   . Hypertension   . Obesity, unspecified 11/27/2012  . Preventative health care 03/08/2015  . Seasonal allergies   . Tobacco abuse 11/27/2012   Down from 3 ppd to 1/2 ppd  . Unspecified hereditary and idiopathic peripheral neuropathy 05/26/2014   Tingling in toes    Past Surgical History:  Procedure Laterality Date  . KNEE SURGERY  1976, 1989   right knee torn ligament repair  . LUMBAR FUSION  1982   L4-L5  . REVISION TOTAL KNEE ARTHROPLASTY  2007   right knee replacement    Family History  Problem Relation Age of Onset  . Arthritis      mother/father/paternal grandparents  . Heart disease Father     pacemaker  . Hypertension Father   . Hypertension Paternal Grandfather   . Hypertension Brother   . Emotional abuse Mother   . Diabetes Brother      x 2  . Diabetes Paternal Grandmother     Social History   Social History    . Marital status: Widowed    Spouse name: N/A  . Number of children: 2  . Years of education: 16   Occupational History  . Security  [employer]   Social History Main Topics  . Smoking status: Current Some Day Smoker    Packs/day: 1.00  . Smokeless tobacco: Never Used     Comment: smokes a pack per day-started in 1972, trying to stop smokes 10 per day.  . Alcohol use No  . Drug use: No  . Sexual activity: No   Other Topics Concern  . Not on file   Social History Narrative   Regular exercise-no   Caffeine Use-yes          Outpatient Medications Prior to Visit  Medication Sig Dispense Refill  . albuterol (PROVENTIL HFA;VENTOLIN HFA) 108 (90 BASE) MCG/ACT inhaler Inhale 2 puffs into the lungs every 6 (six) hours as needed for wheezing. 1 Inhaler 1  . amLODipine (NORVASC) 10 MG tablet Take 1 tablet (10 mg total) by mouth daily. 90 tablet 1  . aspirin 81 MG tablet Take 81 mg by mouth daily.      Marland Kitchen atorvastatin (LIPITOR) 40 MG tablet TAKE 1 TABLET DAILY 90 tablet 1  . carvedilol (COREG) 25 MG  tablet TAKE ONE AND ONE-HALF TABLETS TWICE A DAY 270 tablet 1  . Chromium Picolinate 500 MCG TABS Take 500 mcg by mouth daily.    . cyclobenzaprine (FLEXERIL) 10 MG tablet Take 1 tablet (10 mg total) by mouth 3 (three) times daily as needed for muscle spasms. 30 tablet 0  . fluticasone (FLONASE) 50 MCG/ACT nasal spray Place 2 sprays into both nostrils daily as needed for rhinitis or allergies. 16 g 6  . furosemide (LASIX) 40 MG tablet Take 0.5 tablets (20 mg total) by mouth daily. 45 tablet 3  . gemfibrozil (LOPID) 600 MG tablet TAKE 1 TABLET TWICE A DAY BEFORE MEALS 180 tablet 0  . glucose blood (ONETOUCH VERIO) test strip 1 each by Other route 2 (two) times daily. And lancets 2/day 180 each 12  . HYDROcodone-acetaminophen (NORCO/VICODIN) 5-325 MG tablet Take 1 tablet by mouth every 8 (eight) hours as needed for moderate pain. 30 tablet 0  . Insulin Glargine (LANTUS SOLOSTAR) 100  UNIT/ML Solostar Pen Inject 240 Units into the skin daily. 90 pen PRN  . insulin lispro (HUMALOG KWIKPEN) 100 UNIT/ML KiwkPen Inject 0.4 mLs (40 Units total) into the skin daily with breakfast. 15 mL 2  . Insulin Pen Needle 31G X 5 MM MISC USE AS DIRECTED ONE TIME DAILY. 150 each 1  . Krill Oil CAPS MegaRed or a generic by schiff daily    . lisinopril (PRINIVIL,ZESTRIL) 40 MG tablet Take 1 tablet (40 mg total) by mouth daily. 90 tablet 1  . metFORMIN (GLUCOPHAGE) 500 MG tablet Take 2 tablets (1,000 mg total) by mouth daily. 180 tablet 3  . Multiple Vitamin (MULTIVITAMIN) tablet Take 1 tablet by mouth daily.      . nicotine (NICODERM CQ - DOSED IN MG/24 HR) 7 mg/24hr patch Place 1 patch (7 mg total) onto the skin daily. 28 patch 0  . Probiotic Product (PROBIOTIC DAILY PO) Take 1 capsule by mouth daily.    . sertraline (ZOLOFT) 100 MG tablet TAKE ONE AND ONE-HALF TABLETS DAILY 135 tablet 1  . triamterene-hydrochlorothiazide (MAXZIDE-25) 37.5-25 MG tablet TAKE 1 TABLET DAILY 90 tablet 1   No facility-administered medications prior to visit.     No Known Allergies  Review of Systems  Constitutional: Positive for chills. Negative for fever.  Eyes: Negative for blurred vision.  Respiratory: Positive for cough and wheezing. Negative for shortness of breath.   Cardiovascular: Negative for chest pain and palpitations.  Gastrointestinal: Negative for vomiting.  Musculoskeletal: Negative for back pain.  Skin: Negative for rash.  Neurological: Negative for loss of consciousness and headaches.       Objective:    Physical Exam  Constitutional: He is oriented to person, place, and time. He appears well-developed and well-nourished. No distress.  HENT:  Head: Normocephalic and atraumatic.  Eyes: Conjunctivae are normal.  Neck: Normal range of motion. No thyromegaly present.  Cardiovascular: Normal rate and regular rhythm.   Murmur heard. 2 to 6 murmur  Pulmonary/Chest: Effort normal. No  respiratory distress. He has no wheezes. He exhibits no tenderness.  Bilateral   Abdominal: Soft. Bowel sounds are normal. There is no tenderness.  Musculoskeletal: Normal range of motion. He exhibits no edema or deformity.  Neurological: He is alert and oriented to person, place, and time.  Skin: Skin is warm and dry. He is not diaphoretic.  Psychiatric: He has a normal mood and affect.    BP (!) 144/84 (BP Location: Left Arm, Patient Position: Sitting, Cuff Size: Normal)  Pulse 93   Temp 98 F (36.7 C) (Oral)   Wt 261 lb 12.8 oz (118.8 kg)   SpO2 97%   BMI 33.61 kg/m  Wt Readings from Last 3 Encounters:  09/02/16 261 lb 12.8 oz (118.8 kg)  08/10/16 259 lb 4 oz (117.6 kg)  07/12/16 266 lb (120.7 kg)     Lab Results  Component Value Date   WBC 12.0 (H) 09/02/2016   HGB 14.1 09/02/2016   HCT 41.6 09/02/2016   PLT 212.0 09/02/2016   GLUCOSE 213 (H) 09/02/2016   CHOL 175 09/02/2016   TRIG 333.0 (H) 09/02/2016   HDL 27.50 (L) 09/02/2016   LDLDIRECT 99.0 09/02/2016   LDLCALC NOT CALC 05/15/2014   ALT 19 09/02/2016   AST 13 09/02/2016   NA 133 (L) 09/02/2016   K 5.0 09/02/2016   CL 98 09/02/2016   CREATININE 1.49 09/02/2016   BUN 22 09/02/2016   CO2 29 09/02/2016   TSH 0.86 09/02/2016   PSA 0.35 03/09/2012   HGBA1C 8.1 07/12/2016   MICROALBUR 9.4 (H) 02/24/2015    Lab Results  Component Value Date   TSH 0.86 09/02/2016   Lab Results  Component Value Date   WBC 12.0 (H) 09/02/2016   HGB 14.1 09/02/2016   HCT 41.6 09/02/2016   MCV 92.5 09/02/2016   PLT 212.0 09/02/2016   Lab Results  Component Value Date   NA 133 (L) 09/02/2016   K 5.0 09/02/2016   CO2 29 09/02/2016   GLUCOSE 213 (H) 09/02/2016   BUN 22 09/02/2016   CREATININE 1.49 09/02/2016   BILITOT 0.4 09/02/2016   ALKPHOS 70 09/02/2016   AST 13 09/02/2016   ALT 19 09/02/2016   PROT 7.2 09/02/2016   ALBUMIN 4.5 09/02/2016   CALCIUM 9.5 09/02/2016   GFR 50.53 (L) 09/02/2016   Lab Results    Component Value Date   CHOL 175 09/02/2016   Lab Results  Component Value Date   HDL 27.50 (L) 09/02/2016   Lab Results  Component Value Date   LDLCALC NOT CALC 05/15/2014   Lab Results  Component Value Date   TRIG 333.0 (H) 09/02/2016   Lab Results  Component Value Date   CHOLHDL 6 09/02/2016   Lab Results  Component Value Date   HGBA1C 8.1 07/12/2016       Assessment & Plan:   Problem List Items Addressed This Visit    HTN (hypertension)    Well controlled, no changes to meds. Encouraged heart healthy diet such as the DASH diet and exercise as tolerated.       Relevant Orders   Comprehensive metabolic panel (Completed)   Lipid panel (Completed)   TSH (Completed)   CBC (Completed)   Obesity    Encouraged DASH diet, decrease po intake and increase exercise as tolerated. Needs 7-8 hours of sleep nightly. Avoid trans fats, eat small, frequent meals every 4-5 hours with lean proteins, complex carbs and healthy fats. Minimize simple carbs      Tobacco abuse    Encouraged complete cessation. Discussed need to quit as relates to risk of numerous cancers, cardiac and pulmonary disease as well as neurologic complications. Counseled for greater than 3 minutes      Hyperlipidemia    Tolerating statin, encouraged heart healthy diet, avoid trans fats, minimize simple carbs and saturated fats. Increase exercise as tolerated      Relevant Orders   Lipid panel (Completed)   Neck pain    Has an appt  Dr Dutch Quint to follow up on his recent CT myelogram results which shows progression of degeneration. Try adding Lidocaine patches daily and see if that helps at work      Diabetes mellitus type 2 in obese (HCC)    hgba1c unacceptable, minimize simple carbs. Increase exercise as tolerated. Continue current meds. Has an appt with endocrinology for further consideration next week      Relevant Orders   Comprehensive metabolic panel (Completed)   CBC (Completed)   Acute upper  respiratory infection    Encouraged increased rest and hydration, add probiotics, zinc such as Coldeze or Xicam. Treat fevers as needed. Use albuterol prn. Call if symptoms worsen         I am having Mr. Weisgerber maintain his multivitamin, aspirin, Krill Oil, albuterol, Insulin Pen Needle, Probiotic Product (PROBIOTIC DAILY PO), Chromium Picolinate, amLODipine, nicotine, fluticasone, insulin lispro, glucose blood, sertraline, metFORMIN, furosemide, lisinopril, triamterene-hydrochlorothiazide, gemfibrozil, atorvastatin, carvedilol, Insulin Glargine, cyclobenzaprine, and HYDROcodone-acetaminophen.  No orders of the defined types were placed in this encounter.    Danise Edge, MD

## 2016-09-02 NOTE — Assessment & Plan Note (Signed)
Encouraged complete cessation. Discussed need to quit as relates to risk of numerous cancers, cardiac and pulmonary disease as well as neurologic complications. Counseled for greater than 3 minutes 

## 2016-09-02 NOTE — Assessment & Plan Note (Signed)
Encouraged DASH diet, decrease po intake and increase exercise as tolerated. Needs 7-8 hours of sleep nightly. Avoid trans fats, eat small, frequent meals every 4-5 hours with lean proteins, complex carbs and healthy fats. Minimize simple carbs 

## 2016-09-02 NOTE — Assessment & Plan Note (Addendum)
hgba1c unacceptable, minimize simple carbs. Increase exercise as tolerated. Continue current meds. Has an appt with endocrinology for further consideration next week

## 2016-09-02 NOTE — Patient Instructions (Addendum)
Lidocaine patches for the back. ZINC for colds, Elderberry, or Vitamin C   Viral Respiratory Infection A respiratory infection is an illness that affects part of the respiratory system, such as the lungs, nose, or throat. Most respiratory infections are caused by either viruses or bacteria. A respiratory infection that is caused by a virus is called a viral respiratory infection. Common types of viral respiratory infections include:  A cold.  The flu (influenza).  A respiratory syncytial virus (RSV) infection. How do I know if I have a viral respiratory infection? Most viral respiratory infections cause:  A stuffy or runny nose.  Yellow or green nasal discharge.  A cough.  Sneezing.  Fatigue.  Achy muscles.  A sore throat.  Sweating or chills.  A fever.  A headache. How are viral respiratory infections treated? If influenza is diagnosed early, it may be treated with an antiviral medicine that shortens the length of time a person has symptoms. Symptoms of viral respiratory infections may be treated with over-the-counter and prescription medicines, such as:  Expectorants. These make it easier to cough up mucus.  Decongestant nasal sprays. Health care providers do not prescribe antibiotic medicines for viral infections. This is because antibiotics are designed to kill bacteria. They have no effect on viruses. How do I know if I should stay home from work or school? To avoid exposing others to your respiratory infection, stay home if you have:  A fever.  A persistent cough.  A sore throat.  A runny nose.  Sneezing.  Muscles aches.  Headaches.  Fatigue.  Weakness.  Chills.  Sweating.  Nausea. Follow these instructions at home:  Rest as much as possible.  Take over-the-counter and prescription medicines only as told by your health care provider.  Drink enough fluid to keep your urine clear or pale yellow. This helps prevent dehydration and helps  loosen up mucus.  Gargle with a salt-water mixture 3-4 times per day or as needed. To make a salt-water mixture, completely dissolve -1 tsp of salt in 1 cup of warm water.  Use nose drops made from salt water to ease congestion and soften raw skin around your nose.  Do not drink alcohol.  Do not use tobacco products, including cigarettes, chewing tobacco, and e-cigarettes. If you need help quitting, ask your health care provider. Contact a health care provider if:  Your symptoms last for 10 days or longer.  Your symptoms get worse over time.  You have a fever.  You have severe sinus pain in your face or forehead.  The glands in your jaw or neck become very swollen. Get help right away if:  You feel pain or pressure in your chest.  You have shortness of breath.  You faint or feel like you will faint.  You have severe and persistent vomiting.  You feel confused or disoriented. This information is not intended to replace advice given to you by your health care provider. Make sure you discuss any questions you have with your health care provider. Document Released: 07/14/2005 Document Revised: 03/11/2016 Document Reviewed: 03/12/2015 Elsevier Interactive Patient Education  2017 [employer]

## 2016-09-02 NOTE — Assessment & Plan Note (Signed)
Well controlled, no changes to meds. Encouraged heart healthy diet such as the DASH diet and exercise as tolerated.  °

## 2016-09-02 NOTE — Progress Notes (Signed)
Pre visit review using our clinic review tool, if applicable. No additional management support is needed unless otherwise documented below in the visit note. 

## 2016-09-02 NOTE — Progress Notes (Signed)
Duplicate/error

## 2016-09-03 ENCOUNTER — Other Ambulatory Visit: Payer: Self-pay | Admitting: *Deleted

## 2016-09-03 DIAGNOSIS — E871 Hypo-osmolality and hyponatremia: Secondary | ICD-10-CM

## 2016-09-12 NOTE — Progress Notes (Deleted)
   Subjective:    Patient ID: Gabriel [name], male    DOB: 1951/05/14, 65 y.o.   MRN: [id number]  HPI Pt returns for f/u of diabetes mellitus: DM type: Insulin-requiring type 2 Dx'ed: 1994 Complications: CVA, polyneuropathy, and renal insufficiency.  Therapy: insulin since 2000 DKA: never Severe hypoglycemia: never.  Pancreatitis: never   Other: he works 3rd shift. Interval history: He was changed to lantus alone, due to poor results with multiple daily injections.  Meter is downloaded today, and the printout is scanned into the record.  It varies from 140-320.  There is no trend throughout the day.  He says he never misses the insulin.    Review of Systems     Objective:   Physical Exam VITAL SIGNS:  See vs page GENERAL: no distress Pulses: dorsalis pedis intact bilat.  MSK: no deformity of the feet CV: no leg edema Skin: no ulcer on the feet. normal temp on the feet. There is patchy hyperpigmentation of the feet.  Neuro: sensation is intact to touch on the feet, but decreased from normal.        Assessment & Plan:  Insulin-requiring type 2 DM: he needs increased rx

## 2016-09-13 ENCOUNTER — Other Ambulatory Visit: Payer: Self-pay

## 2016-09-13 ENCOUNTER — Ambulatory Visit: Payer: Self-pay | Admitting: Endocrinology

## 2016-09-13 DIAGNOSIS — Z0289 Encounter for other administrative examinations: Secondary | ICD-10-CM

## 2016-09-14 ENCOUNTER — Other Ambulatory Visit: Payer: Self-pay | Admitting: Family Medicine

## 2016-09-15 ENCOUNTER — Other Ambulatory Visit: Payer: Self-pay | Admitting: Physician Assistant

## 2016-09-15 DIAGNOSIS — M5441 Lumbago with sciatica, right side: Secondary | ICD-10-CM

## 2016-09-20 ENCOUNTER — Other Ambulatory Visit: Payer: Self-pay | Admitting: Family Medicine

## 2016-10-06 ENCOUNTER — Ambulatory Visit (INDEPENDENT_AMBULATORY_CARE_PROVIDER_SITE_OTHER): Payer: Commercial Managed Care - PPO | Admitting: Family Medicine

## 2016-10-06 ENCOUNTER — Encounter: Payer: Self-pay | Admitting: Family Medicine

## 2016-10-06 VITALS — BP 142/80 | HR 86 | Temp 98.3°F | Ht 74.0 in | Wt 256.2 lb

## 2016-10-06 DIAGNOSIS — M545 Low back pain, unspecified: Secondary | ICD-10-CM

## 2016-10-06 MED ORDER — CYCLOBENZAPRINE HCL 10 MG PO TABS: 10.0000 mg | ORAL_TABLET | Freq: Three times a day (TID) | ORAL | 0 refills | Status: DC | PRN

## 2016-10-06 MED ORDER — KETOROLAC TROMETHAMINE 60 MG/2ML IM SOLN
60.0000 mg | Freq: Once | INTRAMUSCULAR | Status: AC
  Administered 2016-10-06: 60 mg via INTRAMUSCULAR

## 2016-10-06 NOTE — Progress Notes (Signed)
Musculoskeletal Exam  Patient: Gabriel [name] DOB: [birth year]  DOS: 10/06/2016  SUBJECTIVE:  Chief Complaint:   Chief Complaint  Patient presents with  . Fall    Pt reports falling sunday and reports mid back pain     Gabriel [name] is a 65 y.o.  male for evaluation and treatment of his back pain.   Onset:  4 days ago. Tripped on a step at a restaurant.  Location: Right lower Character:  sharp  Progression of issue:  has slightly improved Associated symptoms: Will have catching of R arm Denies bowel/bladder incontinence or weakness Treatment: to date has been rest, Tylenol, Aleve Neurovascular symptoms: no  ROS: Gastrointestinal: no bowel incontinence Genitourinary: No bladder incontinence Musculoskeletal/Extremities: +back pain Neurologic: no numbness, tingling no weakness   Past Medical History:  Diagnosis Date  . Abdominal bloating 09/12/2015  . Allergic state 02/08/2013  . Aortic stenosis   . Arthritis   . Congestive heart failure (HCC)    September 2011-CHF  . Depression   . Diabetes mellitus type 2 in obese (HCC) 04/16/2016  . History of chicken pox   . History of chronic bronchitis    dx 2011  . History of stroke 1992   blood clot at base of brain-[facility]  . Hyperlipidemia   . Hypertension   . Seasonal allergies   . Tobacco abuse 11/27/2012   Down from 3 ppd to 1/2 ppd  . Unspecified hereditary and idiopathic peripheral neuropathy 05/26/2014   Tingling in toes   Past Surgical History:  Procedure Laterality Date  . KNEE SURGERY  1976, 1989   right knee torn ligament repair  . LUMBAR FUSION  1982   L4-L5  . REVISION TOTAL KNEE ARTHROPLASTY  2007   right knee replacement   Family History  Problem Relation Age of Onset  . Arthritis      mother/father/paternal grandparents  . Heart disease Father     pacemaker  . Hypertension Father   . Hypertension Paternal Grandfather   . Hypertension Brother   . Emotional abuse Mother   . Diabetes  Brother      x 2  . Diabetes Paternal Grandmother    Current Outpatient Prescriptions  Medication Sig Dispense Refill  . albuterol (PROVENTIL HFA;VENTOLIN HFA) 108 (90 BASE) MCG/ACT inhaler Inhale 2 puffs into the lungs every 6 (six) hours as needed for wheezing. 1 Inhaler 1  . amLODipine (NORVASC) 10 MG tablet Take 1 tablet (10 mg total) by mouth daily. 90 tablet 1  . aspirin 81 MG tablet Take 81 mg by mouth daily.      Marland Kitchen. atorvastatin (LIPITOR) 40 MG tablet TAKE 1 TABLET DAILY 90 tablet 1  . carvedilol (COREG) 25 MG tablet TAKE ONE AND ONE-HALF TABLETS TWICE A DAY 270 tablet 1  . Chromium Picolinate 500 MCG TABS Take 500 mcg by mouth daily.    . cyclobenzaprine (FLEXERIL) 10 MG tablet Take 1 tablet (10 mg total) by mouth 3 (three) times daily as needed for muscle spasms. 30 tablet 0  . fluticasone (FLONASE) 50 MCG/ACT nasal spray Place 2 sprays into both nostrils daily as needed for rhinitis or allergies. 16 g 6  . furosemide (LASIX) 40 MG tablet Take 0.5 tablets (20 mg total) by mouth daily. 45 tablet 3  . gemfibrozil (LOPID) 600 MG tablet TAKE 1 TABLET TWICE A DAY BEFORE MEALS 180 tablet 0  . glucose blood (ONETOUCH VERIO) test strip 1 each by Other route 2 (two) times daily. And lancets  2/day 180 each 12  . HYDROcodone-acetaminophen (NORCO/VICODIN) 5-325 MG tablet Take 1 tablet by mouth every 8 (eight) hours as needed for moderate pain. 30 tablet 0  . Insulin Glargine (LANTUS SOLOSTAR) 100 UNIT/ML Solostar Pen Inject 240 Units into the skin daily. 90 pen PRN  . insulin lispro (HUMALOG KWIKPEN) 100 UNIT/ML KiwkPen Inject 0.4 mLs (40 Units total) into the skin daily with breakfast. 15 mL 2  . Insulin Pen Needle 31G X 5 MM MISC USE AS DIRECTED ONE TIME DAILY. 150 each 1  . Krill Oil CAPS MegaRed or a generic by schiff daily    . lisinopril (PRINIVIL,ZESTRIL) 40 MG tablet Take 1 tablet (40 mg total) by mouth daily. 90 tablet 1  . metFORMIN (GLUCOPHAGE) 500 MG tablet Take 2 tablets (1,000 mg  total) by mouth daily. 180 tablet 3  . Multiple Vitamin (MULTIVITAMIN) tablet Take 1 tablet by mouth daily.      . nicotine (NICODERM CQ - DOSED IN MG/24 HR) 7 mg/24hr patch Place 1 patch (7 mg total) onto the skin daily. 28 patch 0  . Probiotic Product (PROBIOTIC DAILY PO) Take 1 capsule by mouth daily.    . sertraline (ZOLOFT) 100 MG tablet TAKE ONE AND ONE-HALF TABLETS DAILY 135 tablet 1  . triamterene-hydrochlorothiazide (MAXZIDE-25) 37.5-25 MG tablet TAKE 1 TABLET DAILY 90 tablet 1   No Known Allergies Social History   Social History  . Marital status: Widowed  . Number of children: 2  . Years of education: 16   Occupational History  . Security  [employer]   Social History Main Topics  . Smoking status: Current Some Day Smoker    Packs/day: 1.00  . Smokeless tobacco: Never Used     Comment: smokes a pack per day-started in 1972, trying to stop smokes 10 per day.  . Alcohol use No  . Drug use: No  . Sexual activity: No   Social History Narrative   Regular exercise-no   Caffeine Use-yes          Objective:  VITAL SIGNS: BP (!) 142/80 (BP Location: Left Arm, Patient Position: Sitting, Cuff Size: Small)   Pulse 86   Temp 98.3 F (36.8 C) (Oral)   Ht 6\' 2"  (1.88 m)   Wt 256 lb 3.2 oz (116.2 kg)   SpO2 98%   BMI 32.89 kg/m  Constitutional: Well formed, well developed. No acute distress.  Cardiovascular: RRR, 2/6 SEM heard loudest in aortic listening post Thorax & Lungs:  CTAB, no wheezing or rales  Extremities: No clubbing. No cyanosis. No edema.  Skin: Warm. Dry. No erythema. No rash.  Musculoskeletal: low back.   Normal active range of motion: no.   Normal passive range of motion: no Tenderness to palpation: yes- R thoracic and lumbar paraspinal musculature; fullness appreciate in R lumbar area Deformity: no Ecchymosis: no Straight leg test: negative for Neurologic: Normal sensory function. No focal deficits noted. DTR's equal and symmetric in LE's. No  clonus. Psychiatric: Normal mood. Age appropriate judgment and insight. Alert & oriented x 3.    Assessment:  Acute right-sided low back pain without sciatica - Plan: cyclobenzaprine (FLEXERIL) 10 MG tablet  Plan: Orders as above. Refill flexeril. Continue heat, avoiding aggravating activities. Add home stretches/exercises for prevention in future. ROM poor in LE's. 1x Toradol injection. BP's at home have been 120's/70's and he is 79>60 yo. Will not change HTN meds. F/u prn. The patient voiced understanding and agreement to the plan.   Jilda RocheNicholas Paul  Wendling, DO 10/06/16  8:29 AM

## 2016-10-06 NOTE — Progress Notes (Signed)
Pre visit review using our clinic review tool, if applicable. No additional management support is needed unless otherwise documented below in the visit note. 

## 2016-10-06 NOTE — Patient Instructions (Addendum)
Tylenol today. Heat.  No Aleve for rest of day.   EXERCISES  RANGE OF MOTION (ROM) AND STRETCHING EXERCISES - Low Back Sprain Most people with lower back pain will find that their symptoms get worse with excessive bending forward (flexion) or arching at the lower back (extension). The exercises that will help resolve your symptoms will focus on the opposite motion.  Your physician, physical therapist or athletic trainer will help you determine which exercises will be most helpful to resolve your lower back pain. Do not complete any exercises without first consulting with your caregiver. Discontinue any exercises which make your symptoms worse, until you speak to your caregiver. If you have pain, numbness or tingling which travels down into your buttocks, leg or foot, the goal of the therapy is for these symptoms to move closer to your back and eventually resolve. Sometimes, these leg symptoms will get better, but your lower back pain may worsen. This is often an indication of progress in your rehabilitation. Be very alert to any changes in your symptoms and the activities in which you participated in the 24 hours prior to the change. Sharing this information with your caregiver will allow him or her to most efficiently treat your condition. These exercises may help you when beginning to rehabilitate your injury. Your symptoms may resolve with or without further involvement from your physician, physical therapist or athletic trainer. While completing these exercises, remember:   Restoring tissue flexibility helps normal motion to return to the joints. This allows healthier, less painful movement and activity.  An effective stretch should be held for at least 30 seconds.  A stretch should never be painful. You should only feel a gentle lengthening or release in the stretched tissue. FLEXION RANGE OF MOTION AND STRETCHING EXERCISES:  STRETCH - Flexion, Single Knee to Chest   Lie on a firm bed or  floor with both legs extended in front of you.  Keeping one leg in contact with the floor, bring your opposite knee to your chest. Hold your leg in place by either grabbing behind your thigh or at your knee.  Pull until you feel a gentle stretch in your low back. Hold 15-20 seconds.  Slowly release your grasp and repeat the exercise with the opposite side. Repeat 2 times. Complete this exercise 1-2 times per day.   STRETCH - Flexion, Double Knee to Chest  Lie on a firm bed or floor with both legs extended in front of you.  Keeping one leg in contact with the floor, bring your opposite knee to your chest.  Tense your stomach muscles to support your back and then lift your other knee to your chest. Hold your legs in place by either grabbing behind your thighs or at your knees.  Pull both knees toward your chest until you feel a gentle stretch in your low back. Hold 15-20 seconds.  Tense your stomach muscles and slowly return one leg at a time to the floor. Repeat 2 times. Complete this exercise 1-2 times per day.   STRETCH - Low Trunk Rotation  Lie on a firm bed or floor. Keeping your legs in front of you, bend your knees so they are both pointed toward the ceiling and your feet are flat on the floor.  Extend your arms out to the side. This will stabilize your upper body by keeping your shoulders in contact with the floor.  Gently and slowly drop both knees together to one side until you feel a gentle  stretch in your low back. Hold for 15-20 seconds.  Tense your stomach muscles to support your lower back as you bring your knees back to the starting position. Repeat the exercise to the other side. Repeat 2 times. Complete this exercise 1-2 times per day  EXTENSION RANGE OF MOTION AND FLEXIBILITY EXERCISES:  STRETCH - Extension, Prone on Elbows   Lie on your stomach on the floor, a bed will be too soft. Place your palms about shoulder width apart and at the height of your  head.  Place your elbows under your shoulders. If this is too painful, stack pillows under your chest.  Allow your body to relax so that your hips drop lower and make contact more completely with the floor.  Hold this position for 15-20 seconds.  Slowly return to lying flat on the floor. Repeat 2 times. Complete this exercise 1-2 times per day.   RANGE OF MOTION - Extension, Prone Press Ups  Lie on your stomach on the floor, a bed will be too soft. Place your palms about shoulder width apart and at the height of your head.  Keeping your back as relaxed as possible, slowly straighten your elbows while keeping your hips on the floor. You may adjust the placement of your hands to maximize your comfort. As you gain motion, your hands will come more underneath your shoulders.  Hold this position 15-20 seconds.  Slowly return to lying flat on the floor. Repeat 2 times. Complete this exercise 1-2 times per day.   RANGE OF MOTION- Quadruped, Neutral Spine   Assume a hands and knees position on a firm surface. Keep your hands under your shoulders and your knees under your hips. You may place padding under your knees for comfort.  Drop your head and point your tailbone toward the ground below you. This will round out your lower back like an angry cat. Hold this position for 15-20 seconds.  Slowly lift your head and release your tail bone so that your back sags into a large arch, like an old horse.  Hold this position for 15-20 seconds.  Repeat this until you feel limber in your low back.  Now, find your "sweet spot." This will be the most comfortable position somewhere between the two previous positions. This is your neutral spine. Once you have found this position, tense your stomach muscles to support your low back.  Hold this position for 15-20 seconds. Repeat 2 times. Complete this exercise 1-2 times per day.  STRENGTHENING EXERCISES - Low Back Sprain These exercises may help you  when beginning to rehabilitate your injury. These exercises should be done near your "sweet spot." This is the neutral, low-back arch, somewhere between fully rounded and fully arched, that is your least painful position. When performed in this safe range of motion, these exercises can be used for people who have either a flexion or extension based injury. These exercises may resolve your symptoms with or without further involvement from your physician, physical therapist or athletic trainer. While completing these exercises, remember:   Muscles can gain both the endurance and the strength needed for everyday activities through controlled exercises.  Complete these exercises as instructed by your physician, physical therapist or athletic trainer. Increase the resistance and repetitions only as guided.  You may experience muscle soreness or fatigue, but the pain or discomfort you are trying to eliminate should never worsen during these exercises. If this pain does worsen, stop and make certain you are following the  directions exactly. If the pain is still present after adjustments, discontinue the exercise until you can discuss the trouble with your caregiver.  STRENGTHENING - Deep Abdominals, Pelvic Tilt   Lie on a firm bed or floor. Keeping your legs in front of you, bend your knees so they are both pointed toward the ceiling and your feet are flat on the floor.  Tense your lower abdominal muscles to press your low back into the floor. This motion will rotate your pelvis so that your tail bone is scooping upwards rather than pointing at your feet or into the floor. With a gentle tension and even breathing, hold this position for 10-15 seconds. Repeat 2 times. Complete this exercise 1 time per day.   STRENGTHENING - Abdominals, Crunches   Lie on a firm bed or floor. Keeping your legs in front of you, bend your knees so they are both pointed toward the ceiling and your feet are flat on the floor.  Cross your arms over your chest.  Slightly tip your chin down without bending your neck.  Tense your abdominals and slowly lift your trunk high enough to just clear your shoulder blades. Lifting higher can put excessive stress on the lower back and does not further strengthen your abdominal muscles.  Control your return to the starting position. Repeat 2 times. Complete this exercise once every 1-2 days.   STRENGTHENING - Quadruped, Opposite UE/LE Lift   Assume a hands and knees position on a firm surface. Keep your hands under your shoulders and your knees under your hips. You may place padding under your knees for comfort.  Find your neutral spine and gently tense your abdominal muscles so that you can maintain this position. Your shoulders and hips should form a rectangle that is parallel with the floor and is not twisted.  Keeping your trunk steady, lift your right hand no higher than your shoulder and then your left leg no higher than your hip. Make sure you are not holding your breath. Hold this position for 15-20 seconds.  Continuing to keep your abdominal muscles tense and your back steady, slowly return to your starting position. Repeat with the opposite arm and leg. Repeat 2 times. Complete this exercise once every 1-2 days.   STRENGTHENING - Abdominals and Quadriceps, Straight Leg Raise   Lie on a firm bed or floor with both legs extended in front of you.  Keeping one leg in contact with the floor, bend the other knee so that your foot can rest flat on the floor.  Find your neutral spine, and tense your abdominal muscles to maintain your spinal position throughout the exercise.  Slowly lift your straight leg off the floor about 6 inches for a count of 15, making sure to not hold your breath.  Still keeping your neutral spine, slowly lower your leg all the way to the floor. Repeat this exercise with each leg 2 times. Complete this exercise once every 1-2 days. POSTURE AND  BODY MECHANICS CONSIDERATIONS - Low Back Sprain Keeping correct posture when sitting, standing or completing your activities will reduce the stress put on different body tissues, allowing injured tissues a chance to heal and limiting painful experiences. The following are general guidelines for improved posture. Your physician or physical therapist will provide you with any instructions specific to your needs. While reading these guidelines, remember:  The exercises prescribed by your provider will help you have the flexibility and strength to maintain correct postures.  The correct posture  provides the best environment for your joints to work. All of your joints have less wear and tear when properly supported by a spine with good posture. This means you will experience a healthier, less painful body.  Correct posture must be practiced with all of your activities, especially prolonged sitting and standing. Correct posture is as important when doing repetitive low-stress activities (typing) as it is when doing a single heavy-load activity (lifting).  RESTING POSITIONS Consider which positions are most painful for you when choosing a resting position. If you have pain with flexion-based activities (sitting, bending, stooping, squatting), choose a position that allows you to rest in a less flexed posture. You would want to avoid curling into a fetal position on your side. If your pain worsens with extension-based activities (prolonged standing, working overhead), avoid resting in an extended position such as sleeping on your stomach. Most people will find more comfort when they rest with their spine in a more neutral position, neither too rounded nor too arched. Lying on a non-sagging bed on your side with a pillow between your knees, or on your back with a pillow under your knees will often provide some relief. Keep in mind, being in any one position for a prolonged period of time, no matter how correct your  posture, can still lead to stiffness. PROPER SITTING POSTURE In order to minimize stress and discomfort on your spine, you must sit with correct posture. Sitting with good posture should be effortless for a healthy body. Returning to good posture is a gradual process. Many people can work toward this most comfortably by using various supports until they have the flexibility and strength to maintain this posture on their own. When sitting with proper posture, your ears will fall over your shoulders and your shoulders will fall over your hips. You should use the back of the chair to support your upper back. Your lower back will be in a neutral position, just slightly arched. You may place a small pillow or folded towel at the base of your lower back for  support.  When working at a desk, create an environment that supports good, upright posture. Without extra support, muscles tire, which leads to excessive strain on joints and other tissues. Keep these recommendations in mind:  CHAIR:  A chair should be able to slide under your desk when your back makes contact with the back of the chair. This allows you to work closely.  The chair's height should allow your eyes to be level with the upper part of your monitor and your hands to be slightly lower than your elbows.  BODY POSITION  Your feet should make contact with the floor. If this is not possible, use a foot rest.  Keep your ears over your shoulders. This will reduce stress on your neck and low back.  INCORRECT SITTING POSTURES  If you are feeling tired and unable to assume a healthy sitting posture, do not slouch or slump. This puts excessive strain on your back tissues, causing more damage and pain. Healthier options include:  Using more support, like a lumbar pillow.  Switching tasks to something that requires you to be upright or walking.  Talking a brief walk.  Lying down to rest in a neutral-spine position.  PROLONGED STANDING  WHILE SLIGHTLY LEANING FORWARD  When completing a task that requires you to lean forward while standing in one place for a long time, place either foot up on a stationary 2-4 inch high object to  help maintain the best posture. When both feet are on the ground, the lower back tends to lose its slight inward curve. If this curve flattens (or becomes too large), then the back and your other joints will experience too much stress, tire more quickly, and can cause pain.  CORRECT STANDING POSTURES Proper standing posture should be assumed with all daily activities, even if they only take a few moments, like when brushing your teeth. As in sitting, your ears should fall over your shoulders and your shoulders should fall over your hips. You should keep a slight tension in your abdominal muscles to brace your spine. Your tailbone should point down to the ground, not behind your body, resulting in an over-extended swayback posture.   INCORRECT STANDING POSTURES  Common incorrect standing postures include a forward head, locked knees and/or an excessive swayback. WALKING Walk with an upright posture. Your ears, shoulders and hips should all line-up.  PROLONGED ACTIVITY IN A FLEXED POSITION When completing a task that requires you to bend forward at your waist or lean over a low surface, try to find a way to stabilize 3 out of 4 of your limbs. You can place a hand or elbow on your thigh or rest a knee on the surface you are reaching across. This will provide you more stability, so that your muscles do not tire as quickly. By keeping your knees relaxed, or slightly bent, you will also reduce stress across your lower back. CORRECT LIFTING TECHNIQUES  DO :  Assume a wide stance. This will provide you more stability and the opportunity to get as close as possible to the object which you are lifting.  Tense your abdominals to brace your spine. Bend at the knees and hips. Keeping your back locked in a neutral-spine  position, lift using your leg muscles. Lift with your legs, keeping your back straight.  Test the weight of unknown objects before attempting to lift them.  Try to keep your elbows locked down at your sides in order get the best strength from your shoulders when carrying an object.     Always ask for help when lifting heavy or awkward objects. INCORRECT LIFTING TECHNIQUES DO NOT:   Lock your knees when lifting, even if it is a small object.  Bend and twist. Pivot at your feet or move your feet when needing to change directions.  Assume that you can safely pick up even a paperclip without proper posture.

## 2016-10-06 NOTE — Addendum Note (Signed)
Addended by: Kandace BlitzLONG, Nadim Malia M on: 10/06/2016 09:17 AM   Modules accepted: Orders

## 2016-11-12 ENCOUNTER — Other Ambulatory Visit: Payer: Self-pay

## 2016-11-12 MED ORDER — INSULIN GLARGINE 100 UNIT/ML SOLOSTAR PEN: 240.0000 [IU] | PEN_INJECTOR | Freq: Every day | SUBCUTANEOUS | 99 refills | Status: AC

## 2016-11-16 ENCOUNTER — Other Ambulatory Visit: Payer: Self-pay | Admitting: Family Medicine

## 2016-11-22 ENCOUNTER — Other Ambulatory Visit: Payer: Self-pay | Admitting: Family Medicine

## 2016-12-13 ENCOUNTER — Other Ambulatory Visit: Payer: Self-pay | Admitting: Family Medicine

## 2017-01-05 ENCOUNTER — Other Ambulatory Visit: Payer: Self-pay | Admitting: Family Medicine

## 2017-01-09 ENCOUNTER — Other Ambulatory Visit: Payer: Self-pay | Admitting: Family Medicine

## 2017-02-18 ENCOUNTER — Encounter (HOSPITAL_BASED_OUTPATIENT_CLINIC_OR_DEPARTMENT_OTHER): Payer: Self-pay | Admitting: Emergency Medicine

## 2017-02-18 ENCOUNTER — Inpatient Hospital Stay (HOSPITAL_BASED_OUTPATIENT_CLINIC_OR_DEPARTMENT_OTHER)
Admission: EM | Admit: 2017-02-18 | Discharge: 2017-02-22 | DRG: 065 | Disposition: A | Discharge status: Home Health | Payer: BLUE CROSS/BLUE SHIELD | Attending: Internal Medicine | Admitting: Internal Medicine

## 2017-02-18 ENCOUNTER — Other Ambulatory Visit (HOSPITAL_COMMUNITY): Payer: Self-pay | Admitting: General Practice

## 2017-02-18 ENCOUNTER — Observation Stay (HOSPITAL_COMMUNITY): Payer: BLUE CROSS/BLUE SHIELD

## 2017-02-18 ENCOUNTER — Ambulatory Visit (INDEPENDENT_AMBULATORY_CARE_PROVIDER_SITE_OTHER): Payer: BLUE CROSS/BLUE SHIELD | Admitting: Family Medicine

## 2017-02-18 ENCOUNTER — Other Ambulatory Visit (HOSPITAL_COMMUNITY): Payer: Self-pay

## 2017-02-18 ENCOUNTER — Encounter: Payer: Self-pay | Admitting: Family Medicine

## 2017-02-18 ENCOUNTER — Emergency Department (HOSPITAL_BASED_OUTPATIENT_CLINIC_OR_DEPARTMENT_OTHER): Payer: BLUE CROSS/BLUE SHIELD

## 2017-02-18 VITALS — BP 108/70 | HR 81 | Temp 98.2°F | Ht 74.0 in | Wt 238.2 lb

## 2017-02-18 DIAGNOSIS — N183 Chronic kidney disease, stage 3 unspecified: Secondary | ICD-10-CM | POA: Diagnosis present

## 2017-02-18 DIAGNOSIS — Z8673 Personal history of transient ischemic attack (TIA), and cerebral infarction without residual deficits: Secondary | ICD-10-CM

## 2017-02-18 DIAGNOSIS — I2510 Atherosclerotic heart disease of native coronary artery without angina pectoris: Secondary | ICD-10-CM | POA: Diagnosis present

## 2017-02-18 DIAGNOSIS — R066 Hiccough: Secondary | ICD-10-CM | POA: Diagnosis present

## 2017-02-18 DIAGNOSIS — I130 Hypertensive heart and chronic kidney disease with heart failure and stage 1 through stage 4 chronic kidney disease, or unspecified chronic kidney disease: Secondary | ICD-10-CM | POA: Diagnosis present

## 2017-02-18 DIAGNOSIS — L02222 Furuncle of back [any part, except buttock]: Secondary | ICD-10-CM | POA: Diagnosis present

## 2017-02-18 DIAGNOSIS — R471 Dysarthria and anarthria: Secondary | ICD-10-CM | POA: Diagnosis present

## 2017-02-18 DIAGNOSIS — R062 Wheezing: Secondary | ICD-10-CM

## 2017-02-18 DIAGNOSIS — F329 Major depressive disorder, single episode, unspecified: Secondary | ICD-10-CM | POA: Diagnosis present

## 2017-02-18 DIAGNOSIS — E871 Hypo-osmolality and hyponatremia: Secondary | ICD-10-CM

## 2017-02-18 DIAGNOSIS — E1122 Type 2 diabetes mellitus with diabetic chronic kidney disease: Secondary | ICD-10-CM | POA: Diagnosis present

## 2017-02-18 DIAGNOSIS — G8191 Hemiplegia, unspecified affecting right dominant side: Secondary | ICD-10-CM | POA: Diagnosis not present

## 2017-02-18 DIAGNOSIS — R200 Anesthesia of skin: Secondary | ICD-10-CM | POA: Diagnosis not present

## 2017-02-18 DIAGNOSIS — I350 Nonrheumatic aortic (valve) stenosis: Secondary | ICD-10-CM | POA: Diagnosis present

## 2017-02-18 DIAGNOSIS — E785 Hyperlipidemia, unspecified: Secondary | ICD-10-CM | POA: Diagnosis present

## 2017-02-18 DIAGNOSIS — R4701 Aphasia: Secondary | ICD-10-CM | POA: Diagnosis present

## 2017-02-18 DIAGNOSIS — R7989 Other specified abnormal findings of blood chemistry: Secondary | ICD-10-CM | POA: Diagnosis present

## 2017-02-18 DIAGNOSIS — R4781 Slurred speech: Secondary | ICD-10-CM

## 2017-02-18 DIAGNOSIS — D72829 Elevated white blood cell count, unspecified: Secondary | ICD-10-CM | POA: Diagnosis present

## 2017-02-18 DIAGNOSIS — F17210 Nicotine dependence, cigarettes, uncomplicated: Secondary | ICD-10-CM | POA: Diagnosis present

## 2017-02-18 DIAGNOSIS — E860 Dehydration: Secondary | ICD-10-CM | POA: Diagnosis present

## 2017-02-18 DIAGNOSIS — G451 Carotid artery syndrome (hemispheric): Secondary | ICD-10-CM | POA: Diagnosis not present

## 2017-02-18 DIAGNOSIS — I5022 Chronic systolic (congestive) heart failure: Secondary | ICD-10-CM | POA: Diagnosis present

## 2017-02-18 DIAGNOSIS — Z794 Long term (current) use of insulin: Secondary | ICD-10-CM

## 2017-02-18 DIAGNOSIS — Z6830 Body mass index (BMI) 30.0-30.9, adult: Secondary | ICD-10-CM

## 2017-02-18 DIAGNOSIS — I63522 Cerebral infarction due to unspecified occlusion or stenosis of left anterior cerebral artery: Principal | ICD-10-CM | POA: Diagnosis present

## 2017-02-18 DIAGNOSIS — K219 Gastro-esophageal reflux disease without esophagitis: Secondary | ICD-10-CM | POA: Diagnosis present

## 2017-02-18 DIAGNOSIS — R791 Abnormal coagulation profile: Secondary | ICD-10-CM | POA: Diagnosis present

## 2017-02-18 DIAGNOSIS — N179 Acute kidney failure, unspecified: Secondary | ICD-10-CM | POA: Diagnosis present

## 2017-02-18 DIAGNOSIS — E669 Obesity, unspecified: Secondary | ICD-10-CM | POA: Diagnosis present

## 2017-02-18 DIAGNOSIS — L0292 Furuncle, unspecified: Secondary | ICD-10-CM | POA: Diagnosis present

## 2017-02-18 DIAGNOSIS — Z96651 Presence of right artificial knee joint: Secondary | ICD-10-CM | POA: Diagnosis present

## 2017-02-18 DIAGNOSIS — T7840XA Allergy, unspecified, initial encounter: Secondary | ICD-10-CM | POA: Diagnosis present

## 2017-02-18 DIAGNOSIS — Z981 Arthrodesis status: Secondary | ICD-10-CM

## 2017-02-18 DIAGNOSIS — G609 Hereditary and idiopathic neuropathy, unspecified: Secondary | ICD-10-CM | POA: Diagnosis present

## 2017-02-18 DIAGNOSIS — Z720 Tobacco use: Secondary | ICD-10-CM | POA: Diagnosis present

## 2017-02-18 DIAGNOSIS — I480 Paroxysmal atrial fibrillation: Secondary | ICD-10-CM | POA: Diagnosis present

## 2017-02-18 DIAGNOSIS — R739 Hyperglycemia, unspecified: Secondary | ICD-10-CM

## 2017-02-18 DIAGNOSIS — E781 Pure hyperglyceridemia: Secondary | ICD-10-CM | POA: Diagnosis present

## 2017-02-18 DIAGNOSIS — G459 Transient cerebral ischemic attack, unspecified: Secondary | ICD-10-CM

## 2017-02-18 DIAGNOSIS — H9203 Otalgia, bilateral: Secondary | ICD-10-CM | POA: Diagnosis present

## 2017-02-18 DIAGNOSIS — Z79899 Other long term (current) drug therapy: Secondary | ICD-10-CM

## 2017-02-18 DIAGNOSIS — E1165 Type 2 diabetes mellitus with hyperglycemia: Secondary | ICD-10-CM | POA: Diagnosis present

## 2017-02-18 DIAGNOSIS — R778 Other specified abnormalities of plasma proteins: Secondary | ICD-10-CM | POA: Diagnosis present

## 2017-02-18 DIAGNOSIS — R29810 Facial weakness: Secondary | ICD-10-CM | POA: Diagnosis not present

## 2017-02-18 DIAGNOSIS — E1142 Type 2 diabetes mellitus with diabetic polyneuropathy: Secondary | ICD-10-CM | POA: Diagnosis present

## 2017-02-18 DIAGNOSIS — I639 Cerebral infarction, unspecified: Secondary | ICD-10-CM | POA: Diagnosis present

## 2017-02-18 DIAGNOSIS — I10 Essential (primary) hypertension: Secondary | ICD-10-CM | POA: Diagnosis present

## 2017-02-18 DIAGNOSIS — Z7982 Long term (current) use of aspirin: Secondary | ICD-10-CM

## 2017-02-18 HISTORY — DX: Cerebral infarction, unspecified: I63.9

## 2017-02-18 LAB — CBC
HEMATOCRIT: 37.9 % — AB (ref 39.0–52.0)
Hemoglobin: 13.7 g/dL (ref 13.0–17.0)
MCH: 31.9 pg (ref 26.0–34.0)
MCHC: 36.1 g/dL — AB (ref 30.0–36.0)
MCV: 88.3 fL (ref 78.0–100.0)
PLATELETS: 211 10*3/uL (ref 150–400)
RBC: 4.29 MIL/uL (ref 4.22–5.81)
RDW: 12.9 % (ref 11.5–15.5)
WBC: 11 10*3/uL — AB (ref 4.0–10.5)

## 2017-02-18 LAB — COMPREHENSIVE METABOLIC PANEL
ALBUMIN: 4 g/dL (ref 3.5–5.0)
ALT: 13 U/L — AB (ref 17–63)
AST: 14 U/L — AB (ref 15–41)
Alkaline Phosphatase: 92 U/L (ref 38–126)
Anion gap: 12 (ref 5–15)
BUN: 31 mg/dL — AB (ref 6–20)
CHLORIDE: 86 mmol/L — AB (ref 101–111)
CO2: 25 mmol/L (ref 22–32)
CREATININE: 1.86 mg/dL — AB (ref 0.61–1.24)
Calcium: 9.3 mg/dL (ref 8.9–10.3)
GFR calc Af Amer: 42 mL/min — ABNORMAL LOW (ref >60)
GFR, EST NON AFRICAN AMERICAN: 37 mL/min — AB (ref >60)
GLUCOSE: 445 mg/dL — AB (ref 65–99)
POTASSIUM: 4.7 mmol/L (ref 3.5–5.1)
SODIUM: 123 mmol/L — AB (ref 135–145)
Total Bilirubin: 0.6 mg/dL (ref 0.3–1.2)
Total Protein: 7.8 g/dL (ref 6.5–8.1)

## 2017-02-18 LAB — URINALYSIS, COMPLETE (UACMP) WITH MICROSCOPIC
Bacteria, UA: NONE SEEN
Bilirubin Urine: NEGATIVE
Ketones, ur: NEGATIVE mg/dL
Leukocytes, UA: NEGATIVE
Nitrite: NEGATIVE
PROTEIN: 100 mg/dL — AB
Specific Gravity, Urine: 1.014 (ref 1.005–1.030)
Squamous Epithelial / LPF: NONE SEEN
pH: 5 (ref 5.0–8.0)

## 2017-02-18 LAB — DIFFERENTIAL
BASOS ABS: 0 10*3/uL (ref 0.0–0.1)
BASOS PCT: 0 %
EOS ABS: 0.1 10*3/uL (ref 0.0–0.7)
Eosinophils Relative: 1 %
Lymphocytes Relative: 9 %
Lymphs Abs: 1 10*3/uL (ref 0.7–4.0)
MONOS PCT: 7 %
Monocytes Absolute: 0.8 10*3/uL (ref 0.1–1.0)
NEUTROS ABS: 9.1 10*3/uL — AB (ref 1.7–7.7)
NEUTROS PCT: 83 %

## 2017-02-18 LAB — APTT
APTT: 57 s — AB (ref 24–36)
APTT: 47 s — AB (ref 24–36)

## 2017-02-18 LAB — BASIC METABOLIC PANEL
ANION GAP: 12 (ref 5–15)
BUN: 32 mg/dL — ABNORMAL HIGH (ref 6–20)
CO2: 21 mmol/L — ABNORMAL LOW (ref 22–32)
Calcium: 8.8 mg/dL — ABNORMAL LOW (ref 8.9–10.3)
Chloride: 91 mmol/L — ABNORMAL LOW (ref 101–111)
Creatinine, Ser: 1.6 mg/dL — ABNORMAL HIGH (ref 0.61–1.24)
GFR calc Af Amer: 51 mL/min — ABNORMAL LOW (ref >60)
GFR, EST NON AFRICAN AMERICAN: 44 mL/min — AB (ref >60)
GLUCOSE: 291 mg/dL — AB (ref 65–99)
POTASSIUM: 4.1 mmol/L (ref 3.5–5.1)
Sodium: 124 mmol/L — ABNORMAL LOW (ref 135–145)

## 2017-02-18 LAB — RAPID URINE DRUG SCREEN, HOSP PERFORMED
AMPHETAMINES: NOT DETECTED
Barbiturates: NOT DETECTED
Benzodiazepines: NOT DETECTED
Cocaine: NOT DETECTED
OPIATES: NOT DETECTED
Tetrahydrocannabinol: NOT DETECTED

## 2017-02-18 LAB — CBG MONITORING, ED
Glucose-Capillary: 474 mg/dL — ABNORMAL HIGH (ref 65–99)
Glucose-Capillary: 476 mg/dL — ABNORMAL HIGH (ref 65–99)

## 2017-02-18 LAB — TROPONIN I
TROPONIN I: 0.08 ng/mL — AB (ref <0.03)
TROPONIN I: 0.07 ng/mL — AB (ref <0.03)
TROPONIN I: 0.06 ng/mL — AB (ref <0.03)

## 2017-02-18 LAB — PROTIME-INR
INR: 1.07
INR: 1.15
PROTHROMBIN TIME: 14.8 s (ref 11.4–15.2)
Prothrombin Time: 13.9 seconds (ref 11.4–15.2)

## 2017-02-18 LAB — MRSA PCR SCREENING: MRSA BY PCR: NEGATIVE

## 2017-02-18 LAB — GLUCOSE, CAPILLARY
Glucose-Capillary: 414 mg/dL — ABNORMAL HIGH (ref 65–99)
Glucose-Capillary: 397 mg/dL — ABNORMAL HIGH (ref 65–99)
Glucose-Capillary: 302 mg/dL — ABNORMAL HIGH (ref 65–99)

## 2017-02-18 LAB — GLUCOSE, POCT (MANUAL RESULT ENTRY): POC GLUCOSE: 469 mg/dL — AB (ref 70–99)

## 2017-02-18 LAB — TSH: TSH: 0.671 u[IU]/mL (ref 0.350–4.500)

## 2017-02-18 LAB — OSMOLALITY: Osmolality: 287 mOsm/kg (ref 275–295)

## 2017-02-18 MED ORDER — GEMFIBROZIL 600 MG PO TABS
600.0000 mg | ORAL_TABLET | Freq: Two times a day (BID) | ORAL | Status: DC
  Administered 2017-02-18 – 2017-02-22 (×7): 600 mg via ORAL
  Filled 2017-02-18 (×9): qty 1

## 2017-02-18 MED ORDER — SODIUM CHLORIDE 0.9 % IV SOLN
INTRAVENOUS | Status: DC
  Administered 2017-02-20 – 2017-02-22 (×3): via INTRAVENOUS

## 2017-02-18 MED ORDER — ACETAMINOPHEN 160 MG/5ML PO SOLN
650.0000 mg | ORAL | Status: DC | PRN
Start: 2017-02-18 — End: 2017-02-22

## 2017-02-18 MED ORDER — STROKE: EARLY STAGES OF RECOVERY BOOK
Freq: Once | Status: DC
  Filled 2017-02-18: qty 1

## 2017-02-18 MED ORDER — INSULIN REGULAR HUMAN 100 UNIT/ML IJ SOLN
10.0000 [IU] | Freq: Once | INTRAMUSCULAR | Status: DC
  Filled 2017-02-18: qty 0.1

## 2017-02-18 MED ORDER — ASPIRIN 81 MG PO TABS: 81.0000 mg | ORAL_TABLET | Freq: Every day | ORAL | Status: DC

## 2017-02-18 MED ORDER — NICOTINE 7 MG/24HR TD PT24
7.0000 mg | MEDICATED_PATCH | Freq: Every day | TRANSDERMAL | Status: DC
  Administered 2017-02-19: 7 mg via TRANSDERMAL
  Filled 2017-02-18: qty 1

## 2017-02-18 MED ORDER — ASPIRIN EC 81 MG PO TBEC: 81.0000 mg | DELAYED_RELEASE_TABLET | Freq: Every day | ORAL | Status: DC

## 2017-02-18 MED ORDER — CARVEDILOL 12.5 MG PO TABS
25.0000 mg | ORAL_TABLET | Freq: Two times a day (BID) | ORAL | Status: DC
  Administered 2017-02-19 – 2017-02-21 (×5): 25 mg via ORAL
  Filled 2017-02-18 (×3): qty 2
  Filled 2017-02-18: qty 1
  Filled 2017-02-18 (×2): qty 2

## 2017-02-18 MED ORDER — CLOPIDOGREL BISULFATE 75 MG PO TABS
75.0000 mg | ORAL_TABLET | Freq: Every day | ORAL | Status: DC
  Administered 2017-02-18 – 2017-02-20 (×3): 75 mg via ORAL
  Filled 2017-02-18 (×3): qty 1

## 2017-02-18 MED ORDER — INSULIN ASPART 100 UNIT/ML ~~LOC~~ SOLN
10.0000 [IU] | Freq: Once | SUBCUTANEOUS | Status: AC
  Administered 2017-02-18: 10 [IU] via SUBCUTANEOUS

## 2017-02-18 MED ORDER — ACETAMINOPHEN 325 MG PO TABS
650.0000 mg | ORAL_TABLET | ORAL | Status: DC | PRN
  Administered 2017-02-20: 650 mg via ORAL
  Filled 2017-02-18: qty 2

## 2017-02-18 MED ORDER — LISINOPRIL 20 MG PO TABS
40.0000 mg | ORAL_TABLET | Freq: Every day | ORAL | Status: DC
  Administered 2017-02-19: 40 mg via ORAL
  Filled 2017-02-18 (×2): qty 2

## 2017-02-18 MED ORDER — HYDROCODONE-ACETAMINOPHEN 5-325 MG PO TABS: 1.0000 | ORAL_TABLET | Freq: Three times a day (TID) | ORAL | Status: DC | PRN

## 2017-02-18 MED ORDER — INSULIN REGULAR HUMAN 100 UNIT/ML IJ SOLN
10.0000 [IU] | Freq: Once | INTRAMUSCULAR | Status: AC
  Administered 2017-02-18: 10 [IU] via SUBCUTANEOUS
  Filled 2017-02-18: qty 1

## 2017-02-18 MED ORDER — CHLORPROMAZINE HCL 25 MG PO TABS
25.0000 mg | ORAL_TABLET | Freq: Three times a day (TID) | ORAL | Status: DC
  Administered 2017-02-18 – 2017-02-22 (×10): 25 mg via ORAL
  Filled 2017-02-18 (×3): qty 1
  Filled 2017-02-18: qty 3
  Filled 2017-02-18 (×2): qty 1
  Filled 2017-02-18 (×4): qty 3
  Filled 2017-02-18 (×5): qty 1

## 2017-02-18 MED ORDER — SODIUM CHLORIDE 0.9 % IV BOLUS (SEPSIS)
500.0000 mL | Freq: Once | INTRAVENOUS | Status: AC
  Administered 2017-02-18: 500 mL via INTRAVENOUS

## 2017-02-18 MED ORDER — FLUTICASONE PROPIONATE 50 MCG/ACT NA SUSP
2.0000 | Freq: Every day | NASAL | Status: DC | PRN
  Filled 2017-02-18: qty 16

## 2017-02-18 MED ORDER — HYDRALAZINE HCL 20 MG/ML IJ SOLN: 5.0000 mg | Freq: Three times a day (TID) | INTRAMUSCULAR | Status: DC | PRN

## 2017-02-18 MED ORDER — INSULIN LISPRO 100 UNIT/ML (KWIKPEN): 20.0000 [IU] | PEN_INJECTOR | Freq: Every day | SUBCUTANEOUS | Status: DC

## 2017-02-18 MED ORDER — CHLORPROMAZINE HCL 25 MG PO TABS: 25.0000 mg | ORAL_TABLET | Freq: Three times a day (TID) | ORAL | 0 refills | Status: AC

## 2017-02-18 MED ORDER — PANTOPRAZOLE SODIUM 40 MG PO TBEC
40.0000 mg | DELAYED_RELEASE_TABLET | Freq: Every day | ORAL | Status: DC
  Administered 2017-02-19 – 2017-02-22 (×3): 40 mg via ORAL
  Filled 2017-02-18 (×3): qty 1

## 2017-02-18 MED ORDER — OMEPRAZOLE 20 MG PO CPDR: 20.0000 mg | DELAYED_RELEASE_CAPSULE | Freq: Every day | ORAL | 3 refills | Status: DC

## 2017-02-18 MED ORDER — ALBUTEROL SULFATE (2.5 MG/3ML) 0.083% IN NEBU: 2.5000 mg | INHALATION_SOLUTION | RESPIRATORY_TRACT | Status: DC | PRN

## 2017-02-18 MED ORDER — INSULIN ASPART 100 UNIT/ML ~~LOC~~ SOLN
0.0000 [IU] | Freq: Three times a day (TID) | SUBCUTANEOUS | Status: DC
  Administered 2017-02-18: 9 [IU] via SUBCUTANEOUS

## 2017-02-18 MED ORDER — ACETAMINOPHEN 650 MG RE SUPP: 650.0000 mg | RECTAL | Status: DC | PRN

## 2017-02-18 MED ORDER — ATORVASTATIN CALCIUM 40 MG PO TABS
40.0000 mg | ORAL_TABLET | Freq: Every day | ORAL | Status: DC
  Administered 2017-02-18 – 2017-02-21 (×4): 40 mg via ORAL
  Filled 2017-02-18 (×4): qty 1

## 2017-02-18 MED ORDER — FUROSEMIDE 40 MG PO TABS: 20.0000 mg | ORAL_TABLET | Freq: Every day | ORAL | Status: DC

## 2017-02-18 MED ORDER — AMLODIPINE BESYLATE 10 MG PO TABS
10.0000 mg | ORAL_TABLET | Freq: Every day | ORAL | Status: DC
  Administered 2017-02-19 – 2017-02-20 (×2): 10 mg via ORAL
  Filled 2017-02-18 (×3): qty 1

## 2017-02-18 MED ORDER — TRIAMTERENE-HCTZ 37.5-25 MG PO TABS
1.0000 | ORAL_TABLET | Freq: Every day | ORAL | Status: DC
  Filled 2017-02-18: qty 1

## 2017-02-18 MED ORDER — SERTRALINE HCL 100 MG PO TABS
150.0000 mg | ORAL_TABLET | Freq: Every day | ORAL | Status: DC
  Administered 2017-02-19 – 2017-02-22 (×3): 150 mg via ORAL
  Filled 2017-02-18 (×3): qty 1

## 2017-02-18 NOTE — ED Notes (Signed)
Spoke with Dominga FerryJoanne RN, receiving nurse for [address] room 8, unable to recieive report, will call back. Report given to Care Armandina StammerLink Bennett, paramedic

## 2017-02-18 NOTE — Patient Instructions (Addendum)
Things to do on your own that may help: -Gently pressing against your eyeballs -Pull your knees to your chest and hold for 10-15 seconds -Sip cold water -Gargle salt water -Hold breath intermittently  Stop chewing gum, drinking carbonated beverages, gulping liquids, and drinking alcohol to help with belching.  These foods may cause you to belch more: Wheat, barley, rye, onion, leek, white part of spring onion, garlic, shallots, artichokes, beetroot, fennel, peas, chicory, pistachio, cashews, legumes, lentils, and chickpeas; Milk, custard, ice cream, and yogurt; Apples, pears, mangoes, cherries, watermelon, asparagus, sugar snap peas, honey, high-fructose corn syrup; Apricots, nectarines, peaches, plums, mushrooms, cauliflower, artificially sweetened chewing gum and confectionery

## 2017-02-18 NOTE — ED Notes (Signed)
Patient transported to CT 

## 2017-02-18 NOTE — Progress Notes (Signed)
Neurology Note  Called by Dr. Ranae PalmsYelverton with update. Patient's symptoms have now largely resolved. As such, will defer tPA. [facility], will see in consultation. Keep in tPA window--may need to treat if symptoms worsen again so need to follow exam closely.

## 2017-02-18 NOTE — Progress Notes (Signed)
Neurology Note  Contacted by Dr Ranae PalmsYelverton at [facility] about this patient. He was at a doctor's visit this afternoon at 1330 when he experienced the sudden onset of slurred speech, right facial droop, and right arm weakness. Per Dr. Ranae PalmsYelverton, his slurred speech and right hand weakness are significant with poorly intelligible speech and limited use of the hand. [facility] from today. This shows no acute ischemia or hemorrhage. Labs notable for glucose 469, waiting for remaining labs including CBC and coags. MAR in EPIC reviewed--no anticoagulants listed. Advised Dr. Ranae PalmsYelverton that if coags and platelets are OK and no other contraindications identified, tPA should be administered and the [facility] to [facility] Neuro ICU. He will notify me of final treatment decision. I will accept the patient in transfer if tPA is administered.

## 2017-02-18 NOTE — Progress Notes (Signed)
Chief Complaint  Patient presents with  . Hiccups    x 3 days    Subjective: Patient is a 66 y.o. male here for hiccups.  Pt has had hiccups for 3 days. It comes and goes, getting worse last night. He will have some SOB during these episodes. He has been belching more and has felt more distension. He denies fevers, nausea, vomiting, recent procedures, or hx of reflux. He has tried drinking cold water at home to no avail.  At the end of the visit, pt started slurring speech. He was able to communicate that he has had a stroke in 1992 in the past. He continues to smoke. No dizziness.   ROS: Heart: Denies chest pain  Lungs: Denies SOB   Family History  Problem Relation Age of Onset  . Arthritis      mother/father/paternal grandparents  . Heart disease Father     pacemaker  . Hypertension Father   . Hypertension Paternal Grandfather   . Hypertension Brother   . Emotional abuse Mother   . Diabetes Brother      x 2  . Diabetes Paternal Grandmother    Past Medical History:  Diagnosis Date  . Abdominal bloating 09/12/2015  . Allergic state 02/08/2013  . Aortic stenosis   . Arthritis   . Congestive heart failure (HCC)    September 2011-CHF  . Depression   . Diabetes mellitus type 2 in obese (HCC) 04/16/2016  . History of chicken pox   . History of chronic bronchitis    dx 2011  . History of stroke 1992   blood clot at base of brain-[facility]  . Hyperlipidemia   . Hypertension   . Seasonal allergies   . Tobacco abuse 11/27/2012   Down from 3 ppd to 1/2 ppd  . Unspecified hereditary and idiopathic peripheral neuropathy 05/26/2014   Tingling in toes   No Known Allergies  Current Outpatient Prescriptions:  .  albuterol (PROVENTIL HFA;VENTOLIN HFA) 108 (90 BASE) MCG/ACT inhaler, Inhale 2 puffs into the lungs every 6 (six) hours as needed for wheezing., Disp: 1 Inhaler, Rfl: 1 .  amLODipine (NORVASC) 10 MG tablet, TAKE 1 TABLET DAILY, Disp: 90 tablet, Rfl: 1 .   aspirin 81 MG tablet, Take 81 mg by mouth daily.  , Disp: , Rfl:  .  atorvastatin (LIPITOR) 40 MG tablet, TAKE 1 TABLET DAILY, Disp: 90 tablet, Rfl: 1 .  carvedilol (COREG) 25 MG tablet, TAKE ONE AND ONE-HALF TABLETS TWICE A DAY, Disp: 270 tablet, Rfl: 1 .  Chromium Picolinate 500 MCG TABS, Take 500 mcg by mouth daily., Disp: , Rfl:  .  cyclobenzaprine (FLEXERIL) 10 MG tablet, Take 1 tablet (10 mg total) by mouth 3 (three) times daily as needed for muscle spasms., Disp: 30 tablet, Rfl: 0 .  fluticasone (FLONASE) 50 MCG/ACT nasal spray, Place 2 sprays into both nostrils daily as needed for rhinitis or allergies., Disp: 16 g, Rfl: 6 .  furosemide (LASIX) 40 MG tablet, Take 0.5 tablets (20 mg total) by mouth daily., Disp: 45 tablet, Rfl: 3 .  gemfibrozil (LOPID) 600 MG tablet, TAKE 1 TABLET TWICE A DAY BEFORE MEALS, Disp: 180 tablet, Rfl: 0 .  glucose blood (ONETOUCH VERIO) test strip, 1 each by Other route 2 (two) times daily. And lancets 2/day, Disp: 180 each, Rfl: 12 .  HYDROcodone-acetaminophen (NORCO/VICODIN) 5-325 MG tablet, Take 1 tablet by mouth every 8 (eight) hours as needed for moderate pain., Disp: 30 tablet, Rfl: 0 .  Insulin Glargine (LANTUS SOLOSTAR) 100 UNIT/ML Solostar Pen, Inject 240 Units into the skin daily., Disp: 90 pen, Rfl: PRN .  insulin lispro (HUMALOG KWIKPEN) 100 UNIT/ML KiwkPen, Inject 0.4 mLs (40 Units total) into the skin daily with breakfast., Disp: 15 mL, Rfl: 2 .  Insulin Pen Needle 31G X 5 MM MISC, USE AS DIRECTED ONE TIME DAILY., Disp: 150 each, Rfl: 1 .  Krill Oil CAPS, MegaRed or a generic by schiff daily, Disp: , Rfl:  .  lisinopril (PRINIVIL,ZESTRIL) 40 MG tablet, Take 1 tablet (40 mg total) by mouth daily., Disp: 90 tablet, Rfl: 1 .  metFORMIN (GLUCOPHAGE) 500 MG tablet, Take 2 tablets (1,000 mg total) by mouth daily., Disp: 180 tablet, Rfl: 3 .  methocarbamol (ROBAXIN) 500 MG tablet, TAKE ONE TABLET BY MOUTH TWICE DAILY AS NEEDED FOR PAIN OR MUSCLE SPASM, Disp:  60 tablet, Rfl: 1 .  Multiple Vitamin (MULTIVITAMIN) tablet, Take 1 tablet by mouth daily.  , Disp: , Rfl:  .  nicotine (NICODERM CQ - DOSED IN MG/24 HR) 7 mg/24hr patch, Place 1 patch (7 mg total) onto the skin daily., Disp: 28 patch, Rfl: 0 .  Probiotic Product (PROBIOTIC DAILY PO), Take 1 capsule by mouth daily., Disp: , Rfl:  .  sertraline (ZOLOFT) 100 MG tablet, TAKE ONE AND ONE-HALF TABLETS DAILY, Disp: 135 tablet, Rfl: 1 .  triamterene-hydrochlorothiazide (MAXZIDE-25) 37.5-25 MG tablet, TAKE 1 TABLET DAILY, Disp: 90 tablet, Rfl: 1 .  chlorproMAZINE (THORAZINE) 25 MG tablet, Take 1 tablet (25 mg total) by mouth 3 (three) times daily., Disp: 30 tablet, Rfl: 0 .  omeprazole (PRILOSEC) 20 MG capsule, Take 1 capsule (20 mg total) by mouth daily., Disp: 30 capsule, Rfl: 3  Objective: BP 108/70 (BP Location: Left Arm, Patient Position: Sitting, Cuff Size: Normal)   Pulse 81   Temp 98.2 F (36.8 C) (Oral)   Ht 6\' 2"  (1.88 m)   Wt 238 lb 3.2 oz (108 kg)   SpO2 97%   BMI 30.58 kg/m  General: Awake, appears stated age HEENT: EOMi Heart: RRR, no murmurs Lungs: CTAB, +diffuse rhonchi. No accessory muscle use Abd: BS+, soft, diffusely TTP, worsen in LLQ, ND, central mass more prominent with valsalva (diastasis recti) Neuro: Speech was fluent until end of visit. Start slurring speech, unable to fluently state "no ifs, ands or buts". Seemed to have some difficulty with word finding. Normal heel to shin. No facial droop during my exam. Msk: 5/5 strength throughout, though was having some 4/5 strength on LUQ with elbow flexion/extension, chronic from previous stroke Psych: Age appropriate judgment and insight, normal affect and mood  Assessment and Plan: Singultus - Plan: chlorproMAZINE (THORAZINE) 25 MG tablet, omeprazole (PRILOSEC) 20 MG capsule  Slurred speech - Plan: POCT Glucose (CBG)  Orders as above for singultus. Will recommend certain things he can do on his own to try to help. Stated  there is no data for this. Diet also given to decrease gas. Enlarged stomach due to gas could be irritating diaphragm.  While discussing the plan for hiccups with pt, he starting slurring his speech. VSS, POCT glucose was 469. Brief exam showed no acute focal deficits, did have some chronic L sided weakness. Given his significant risk factors (smoking, HTN, DM, HLD, previous stroke), we will be sending him downstairs to the ER for further evaluation of the slurred speech. Case was discussed with provider in ED. F/u as originally planned  The patient voiced understanding and agreement to the plan.  [employer]  Carren Rang, DO 02/18/17  1:49 PM

## 2017-02-18 NOTE — ED Notes (Signed)
Report given to Johanna RN

## 2017-02-18 NOTE — Progress Notes (Signed)
Pre visit review using our clinic review tool, if applicable. No additional management support is needed unless otherwise documented below in the visit note. 

## 2017-02-18 NOTE — Consult Note (Signed)
Neurology Consult Note  Reason for Consultation: TIA  Requesting provider: Willette Pa MD  CC: Trouble speaking, right arm weakness now fully resolved  HPI: This is a 66 year old right-handed man who is admitted to [facility] for evaluation of presumed TIA. History is obtained directly from the patient who is a good historian. I've also reviewed his records at length.  The patient reports that he's had persistent hiccups for the past couple of weeks. He was at his doctor's office today to be evaluated for these. While in the office at about 1:30, he stated that he suddenly developed difficulty speaking. His describes this as being unable to get his words out. This is associated with right arm weakness and possibly right facial droop. He was sent to the emergency department at [facility]. On evaluation by the ED physician, he was noted to have 2/5 strength in the right hand with slurred speech and a right lower facial droop. He was not noted to have any ataxia or sensory changes. Emergent CT of the head was obtained and showed no acute abnormality. I was contacted by the ED attending at that time and recommended administration of thrombolytic therapy with TPA if no other contraindications were identified. A few minutes later, I was again contacted by the ED physician who reports that the patient had resolution of symptoms. As such, thrombotic therapy was deferred and the patient has now been transferred for further evaluation of apparent TIA. He continues to remain free of symptoms apart from persistent hiccups and feels that he is currently at his neurologic baseline.  The patient reports a remote history of stroke in 38. He states at that time, he had weakness and numbness of the right side. He says that all his function returned with the exception of some persistent sensory loss on the right side. He states that he does not feel extremes of hot or cold on that side.  PMH:  Past  Medical History:  Diagnosis Date  . Abdominal bloating 09/12/2015  . Allergic state 02/08/2013  . Aortic stenosis   . Arthritis   . Congestive heart failure (HCC)    September 2011-CHF  . Depression   . Diabetes mellitus type 2 in obese (HCC) 04/16/2016  . History of chicken pox   . History of chronic bronchitis    dx 2011  . History of stroke 1992   blood clot at base of brain-[facility]  . Hyperlipidemia   . Hypertension   . Seasonal allergies   . Stroke (HCC)   . Tobacco abuse 11/27/2012   Down from 3 ppd to 1/2 ppd  . Unspecified hereditary and idiopathic peripheral neuropathy 05/26/2014   Tingling in toes    PSH:  Past Surgical History:  Procedure Laterality Date  . KNEE SURGERY  1976, 1989   right knee torn ligament repair  . LUMBAR FUSION  1982   L4-L5  . REVISION TOTAL KNEE ARTHROPLASTY  2007   right knee replacement    Family history: Family History  Problem Relation Age of Onset  . Arthritis      mother/father/paternal grandparents  . Heart disease Father     pacemaker  . Hypertension Father   . Hypertension Paternal Grandfather   . Hypertension Brother   . Emotional abuse Mother   . Diabetes Brother      x 2  . Diabetes Paternal Grandmother     Social history:  Social History   Social History  .  Marital status: Widowed    Spouse name: N/A  . Number of children: 2  . Years of education: 16   Occupational History  . Security  [employer]   Social History Main Topics  . Smoking status: Current Some Day Smoker    Packs/day: 1.00  . Smokeless tobacco: Never Used     Comment: smokes a pack per day-started in 1972, trying to stop smokes 10 per day.  . Alcohol use No  . Drug use: No  . Sexual activity: No   Other Topics Concern  . Not on file   Social History Narrative   Regular exercise-no   Caffeine Use-yes          Current inpatient meds: Medications reviewed and reconciled.  Current Facility-Administered Medications   Medication Dose Route Frequency Provider Last Rate Last Dose  .  stroke: mapping our early stages of recovery book   Does not apply Once Marcos Eke, PA-C      . 0.9 %  sodium chloride infusion   Intravenous Continuous Marcos Eke, PA-C      . acetaminophen (TYLENOL) tablet 650 mg  650 mg Oral Q4H PRN Marcos Eke, PA-C       Or  . acetaminophen (TYLENOL) solution 650 mg  650 mg Per Tube Q4H PRN Marcos Eke, PA-C       Or  . acetaminophen (TYLENOL) suppository 650 mg  650 mg Rectal Q4H PRN Marcos Eke, PA-C      . Melene Muller ON 02/19/2017] amLODipine (NORVASC) tablet 10 mg  10 mg Oral Daily Marcos Eke, PA-C      . [START ON 02/19/2017] aspirin EC tablet 81 mg  81 mg Oral Daily Lahoma Crocker, MD      . atorvastatin (LIPITOR) tablet 40 mg  40 mg Oral q1800 Marcos Eke, PA-C      . Melene Muller ON 02/19/2017] carvedilol (COREG) tablet 25 mg  25 mg Oral BID WC Marcos Eke, PA-C      . chlorproMAZINE (THORAZINE) tablet 25 mg  25 mg Oral TID Marcos Eke, PA-C      . fluticasone (FLONASE) 50 MCG/ACT nasal spray 2 spray  2 spray Each Nare Daily PRN Marcos Eke, PA-C      . Melene Muller ON 02/19/2017] furosemide (LASIX) tablet 20 mg  20 mg Oral Daily Marcos Eke, PA-C      . gemfibrozil (LOPID) tablet 600 mg  600 mg Oral BID AC Marcos Eke, PA-C      . HYDROcodone-acetaminophen (NORCO/VICODIN) 5-325 MG per tablet 1 tablet  1 tablet Oral Q8H PRN Marcos Eke, PA-C      . insulin aspart (novoLOG) injection 0-9 Units  0-9 Units Subcutaneous TID WC Marcos Eke, PA-C      . insulin aspart (novoLOG) injection 10 Units  10 Units Subcutaneous Once Lahoma Crocker, MD      . Melene Muller ON 02/19/2017] insulin lispro (HUMALOG) KwikPen 20 Units  20 Units Subcutaneous Q breakfast Marcos Eke, PA-C      . Melene Muller ON 02/19/2017] lisinopril (PRINIVIL,ZESTRIL) tablet 40 mg  40 mg Oral Daily Marcos Eke, PA-C      . [START ON 02/19/2017] nicotine (NICODERM CQ - dosed in mg/24 hr) patch 7 mg  7 mg  Transdermal Daily Marcos Eke, PA-C      . [START ON 02/19/2017] pantoprazole (PROTONIX) EC tablet 40 mg  40 mg Oral Daily Huntley Dec  E Wertman, PA-C      . [START ON 02/19/2017] sertraline (ZOLOFT) tablet 150 mg  150 mg Oral Daily Marcos EkeSara E Wertman, PA-C      . [START ON 02/19/2017] triamterene-hydrochlorothiazide (MAXZIDE-25) 37.5-25 MG per tablet 1 tablet  1 tablet Oral Daily Marcos EkeSara E Wertman, PA-C        Allergies: No Known Allergies  ROS: As per HPI. A full 14-point review of systems was performed and is otherwise notable for some chronic low back pain and neck pain. Review of systems otherwise unremarkable.  PE:  BP (!) 152/89   Pulse 73   Temp 99 F (37.2 C) (Oral)   Resp 16   SpO2 98%   General: WDWN, no acute distress. He has frequent hiccups. AAO x4. Speech clear, no dysarthria. No aphasia. Follows commands briskly. Affect is bright with congruent mood. Comportment is normal.  HEENT: Normocephalic. Neck supple without LAD. MMM, OP clear. Dentition good. Sclerae anicteric. No conjunctival injection.  CV: Regular, no murmur. Carotid pulses full and symmetric, no bruits. Distal pulses 2+ and symmetric.  Lungs: CTAB on anterior auscultation..  Abdomen: Soft, obese, non-distended, non-tender. Bowel sounds present x4.  Extremities: No C/C/E. Neuro:  CN: Pupils are equal and round. They are symmetrically reactive from 3-->2 mm. visual fields are full to confrontation. EOMI without nystagmus. No reported diplopia. Facial sensation is intact to light touch. Face is symmetric at rest with normal strength and mobility. Hearing is intact to conversational voice. Palate elevates symmetrically and uvula is midline. Voice is normal in tone, pitch and quality. Bilateral SCM and trapezii are 5/5. Tongue is midline with normal bulk and mobility.  Motor: Normal bulk, tone, and strength. No tremor or other abnormal movements. No drift.  Sensation: Intact to light touch. Pinprick is reduced on the right side.   DTRs: 2+, symmetric. Toes downgoing bilaterally. No pathologic reflexes.  Coordination: Finger-to-nose and heel-to-shin are without dysmetria. Finger taps are normal in amplitude and speed, no decrement.     Labs:  Lab Results  Component Value Date   WBC 11.0 (H) 02/18/2017   HGB 13.7 02/18/2017   HCT 37.9 (L) 02/18/2017   PLT 211 02/18/2017   GLUCOSE 445 (H) 02/18/2017   CHOL 175 09/02/2016   TRIG 333.0 (H) 09/02/2016   HDL 27.50 (L) 09/02/2016   LDLDIRECT 99.0 09/02/2016   LDLCALC NOT CALC 05/15/2014   ALT 13 (L) 02/18/2017   AST 14 (L) 02/18/2017   NA 123 (L) 02/18/2017   K 4.7 02/18/2017   CL 86 (L) 02/18/2017   CREATININE 1.86 (H) 02/18/2017   BUN 31 (H) 02/18/2017   CO2 25 02/18/2017   TSH 0.86 09/02/2016   PSA 0.35 03/09/2012   INR 1.07 02/18/2017   HGBA1C 8.1 07/12/2016   MICROALBUR 9.4 (H) 02/24/2015   Troponin 0.08  Imaging:  I have personally and independently reviewed the CT scan of the head without contrast from today. This shows no obvious acute abnormality. There is a mild degree of chronic small vessel ischemic change in the bihemispheric white matter. Volumes appeared normal for age. Ventricles are normal in size and configuration. A mild degree of atherosclerotic calcification is noted in the intracranial vertebrals and carotids.  Assessment and Plan:  1. TIA: His presentation is concerning for a TIA involving the left MCA territory. Known risk factors for cerebrovascular disease in this patient include diabetes, hyperlipidemia, hypertension, tobacco abuse, history of stroke, and obesity. Additional workup will be ordered to include MRI brain,  MRA of the head, TTE, fasting lipids, and hemoglobin a1c. Further testing will be determined by results from these initial studies. He reports that he has been taking aspirin 81 mg daily at the time of this event so would recommend switching to clopidogrel 75 mg daily for secondary stroke prevention. Continue statin with  goal LDL less than 70. Ensure adequate glucose control. Ensure adequate blood pressure control. No need for permissive hypertension in setting of TIA.   2. Right hemiparesis: This was acute but has fully resolved, consistent with TIA. No acute issues. Follow exam.  3. Dysarthria: Again, acute but now resolved. Due to TIA. Follow exam.  4. 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 No acute issue. Follow.  5. Refractory hiccups: Etiology remains uncertain. These are present for today's episode and do not appear to be associated with it. Agree with trial of chlorpromazine, will need to monitor for sedation and extrapyramidal side effects.  This was discussed with the patient. Education was provided on the diagnosis and expected evaluation and treatment. He is in agreement with the plan as noted. He was given the opportunity to ask any questions and these were addressed to his satisfaction.

## 2017-02-18 NOTE — ED Provider Notes (Signed)
[facility] Provider Note   CSN: [id number] Arrival date & time: 02/18/17  1345   An emergency department physician performed an initial assessment on this suspected stroke patient at 1350.  History   Chief Complaint Chief Complaint  Patient presents with  . Altered Mental Status    HPI Gabriel [name] is a 66 y.o. male.  HPI Patient with history of previous stroke in 1992 and uncontrolled diabetes presents from his doctor's office with acute onset slurred speech, right arm weakness and right facial droop starting at 1:30 today. Patient went to see his primary physician for several days of hiccups. Denies any visual changes. No trouble swallowing. Denies chest pain or shortness of breath. Past Medical History:  Diagnosis Date  . Abdominal bloating 09/12/2015  . Allergic state 02/08/2013  . Aortic stenosis   . Arthritis   . Congestive heart failure (HCC)    September 2011-CHF  . Depression   . Diabetes mellitus type 2 in obese (HCC) 04/16/2016  . History of chicken pox   . History of chronic bronchitis    dx 2011  . History of stroke 1992   blood clot at base of brain-[facility]  . Hyperlipidemia   . Hypertension   . Seasonal allergies   . Stroke (HCC)   . Tobacco abuse 11/27/2012   Down from 3 ppd to 1/2 ppd  . Unspecified hereditary and idiopathic peripheral neuropathy 05/26/2014   Tingling in toes    Patient Active Problem List   Diagnosis Date Noted  . TIA (transient ischemic attack) 02/18/2017  . Acute upper respiratory infection 09/02/2016  . Diabetes mellitus type 2 in obese (HCC) 04/16/2016  . Abdominal bloating 09/12/2015  . Neck pain 03/08/2015  . Skin lesion 03/08/2015  . Preventative health care 03/08/2015  . Back pain with left-sided radiculopathy 02/03/2015  . Unspecified hereditary and idiopathic peripheral neuropathy 05/26/2014  . Aortic stenosis 09/19/2013  . Hyperlipidemia 09/19/2013  . Acute low back pain with radicular  symptoms, duration less than 6 weeks 05/16/2013  . Allergic state 02/08/2013  . Obesity 11/27/2012  . Tobacco abuse 11/27/2012  . Palpitations 11/08/2012  . Abnormal EKG 11/08/2012  . ED (erectile dysfunction) 03/17/2012  . HTN (hypertension) 09/01/2011    Past Surgical History:  Procedure Laterality Date  . KNEE SURGERY  1976, 1989   right knee torn ligament repair  . LUMBAR FUSION  1982   L4-L5  . REVISION TOTAL KNEE ARTHROPLASTY  2007   right knee replacement       Home Medications    Prior to Admission medications   Medication Sig Start Date End Date Taking? Authorizing Provider  albuterol (PROVENTIL HFA;VENTOLIN HFA) 108 (90 BASE) MCG/ACT inhaler Inhale 2 puffs into the lungs every 6 (six) hours as needed for wheezing. 05/29/15 11/23/17  Bradd Canary, MD  amLODipine (NORVASC) 10 MG tablet TAKE 1 TABLET DAILY 01/10/17   Bradd Canary, MD  aspirin 81 MG tablet Take 81 mg by mouth daily.      Historical Provider, MD  atorvastatin (LIPITOR) 40 MG tablet TAKE 1 TABLET DAILY 06/22/16   Bradd Canary, MD  carvedilol (COREG) 25 MG tablet TAKE ONE AND ONE-HALF TABLETS TWICE A DAY 01/05/17   Bradd Canary, MD  chlorproMAZINE (THORAZINE) 25 MG tablet Take 1 tablet (25 mg total) by mouth 3 (three) times daily. 02/18/17   Sharlene Dory, DO  Chromium Picolinate 500 MCG TABS Take 500 mcg by mouth daily.  Historical Provider, MD  cyclobenzaprine (FLEXERIL) 10 MG tablet Take 1 tablet (10 mg total) by mouth 3 (three) times daily as needed for muscle spasms. 10/06/16   Jilda RocheNicholas Paul Wendling, DO  fluticasone [facility](FLONASE) 50 MCG/ACT nasal spray Place 2 sprays into both nostrils daily as needed for rhinitis or allergies. 01/15/16   Bradd CanaryStacey A Blyth, MD  furosemide (LASIX) 40 MG tablet Take 0.5 tablets (20 mg total) by mouth daily. 04/16/16   Bradd CanaryStacey A Blyth, MD  gemfibrozil (LOPID) 600 MG tablet TAKE 1 TABLET TWICE A DAY BEFORE MEALS 12/13/16   Bradd CanaryStacey A Blyth, MD  glucose blood (ONETOUCH VERIO)  test strip 1 each by Other [name] 2 (two) times daily. And lancets 2/day 03/10/16   Romero BellingSean Ellison, MD  HYDROcodone-acetaminophen (NORCO/VICODIN) 5-325 MG tablet Take 1 tablet by mouth every 8 (eight) hours as needed for moderate pain. 08/10/16   Waldon MerlWilliam C Martin, PA-C  Insulin Glargine (LANTUS SOLOSTAR) 100 UNIT/ML Solostar Pen Inject 240 Units into the skin daily. 11/12/16   Romero BellingSean Ellison, MD  insulin lispro (HUMALOG KWIKPEN) 100 UNIT/ML KiwkPen Inject 0.4 mLs (40 Units total) into the skin daily with breakfast. 03/10/16   Romero BellingSean Ellison, MD  Insulin Pen Needle 31G X 5 MM MISC USE AS DIRECTED ONE TIME DAILY. 12/31/15   Romero BellingSean Ellison, MD  [place] LaniusKrill Oil CAPS MegaRed or a generic by schiff daily 11/27/12   Bradd CanaryStacey A Blyth, MD  lisinopril (PRINIVIL,ZESTRIL) 40 MG tablet Take 1 tablet (40 mg total) by mouth daily. 04/28/16   Bradd CanaryStacey A Blyth, MD  metFORMIN (GLUCOPHAGE) 500 MG tablet Take 2 tablets (1,000 mg total) by mouth daily. 04/13/16   Carlus Pavlovristina Gherghe, MD  methocarbamol (ROBAXIN) 500 MG tablet TAKE ONE TABLET BY MOUTH TWICE DAILY AS NEEDED FOR PAIN OR MUSCLE SPASM 11/22/16   Bradd CanaryStacey A Blyth, MD  Multiple Vitamin (MULTIVITAMIN) tablet Take 1 tablet by mouth daily.      Historical Provider, MD  nicotine (NICODERM CQ - DOSED IN MG/24 HR) 7 mg/24hr patch Place 1 patch (7 mg total) onto the skin daily. 01/15/16   Bradd CanaryStacey A Blyth, MD  omeprazole (PRILOSEC) 20 MG capsule Take 1 capsule (20 mg total) by mouth daily. 02/18/17   Sharlene DoryNicholas Paul Wendling, DO  Probiotic Product (PROBIOTIC DAILY PO) Take 1 capsule by mouth daily.    Historical Provider, MD  sertraline (ZOLOFT) 100 MG tablet TAKE ONE AND ONE-HALF TABLETS DAILY 09/20/16   Bradd CanaryStacey A Blyth, MD  triamterene-hydrochlorothiazide [facility](MAXZIDE-25) 37.5-25 MG tablet TAKE 1 TABLET DAILY 11/16/16   Bradd CanaryStacey A Blyth, MD    Family History Family History  Problem Relation Age of Onset  . Arthritis      mother/father/paternal grandparents  . Heart disease Father     pacemaker  .  Hypertension Father   . Hypertension Paternal Grandfather   . Hypertension Brother   . Emotional abuse Mother   . Diabetes Brother      x 2  . Diabetes Paternal Grandmother     Social History Social History  Substance Use Topics  . Smoking status: Current Some Day Smoker    Packs/day: 1.00  . Smokeless tobacco: Never Used     Comment: smokes a pack per day-started in 1972, trying to stop smokes 10 per day.  . Alcohol use No     Allergies   Patient has no known allergies.   Review of Systems Review of Systems  Constitutional: Negative for chills and fever.  HENT: Negative for facial swelling.   Eyes:  Negative for visual disturbance.  Respiratory: Negative for cough and shortness of breath.   Cardiovascular: Negative for chest pain, palpitations and leg swelling.  Gastrointestinal: Negative for abdominal pain, diarrhea, nausea and vomiting.  Musculoskeletal: Negative for back pain, myalgias, neck pain and neck stiffness.  Skin: Negative for rash and wound.  Neurological: Positive for speech difficulty and weakness. Negative for dizziness, light-headedness, numbness and headaches.  All other systems reviewed and are negative.    Physical Exam Updated Vital Signs BP (!) 166/67   Pulse 79   Temp 98.4 F (36.9 C) (Oral)   Resp 14   SpO2 97%   Physical Exam  Constitutional: He is oriented to person, place, and time. He appears well-developed and well-nourished. No distress.  HENT:  Head: Normocephalic and atraumatic.  Mouth/Throat: Oropharynx is clear and moist.  Eyes: EOM are normal. Pupils are equal, round, and reactive to light.  Neck: Normal range of motion. Neck supple.  Cardiovascular: Normal rate and regular rhythm.   Murmur heard. Pulmonary/Chest: Effort normal and breath sounds normal. No respiratory distress. He has no wheezes. He has no rales. He exhibits no tenderness.  Abdominal: Soft. Bowel sounds are normal. There is no tenderness. There is no rebound  and no guarding.  Musculoskeletal: Normal range of motion. He exhibits no edema or tenderness.  No lower extremity swelling, asymmetry or tenderness.  Neurological: He is alert and oriented to person, place, and time.  2/5 grip strength in right hand. 5/5 grip strength in left hand. 5/5 motor in bilateral lower extremities. Sensation to light touch is grossly intact. Patient does have slurring of his speech making it difficult to understand him. Right lower facial droop. Bilateral finger-to-nose testing grossly normal  Skin: Skin is warm and dry. No rash noted. No erythema.  Psychiatric: He has a normal mood and affect. His behavior is normal.  Nursing note and vitals reviewed.    ED Treatments / Results  Labs (all labs ordered are listed, but only abnormal results are displayed) Labs Reviewed  APTT - Abnormal; Notable for the following:       Result Value   aPTT 57 (*)    All other components within normal limits  CBC - Abnormal; Notable for the following:    WBC 11.0 (*)    HCT 37.9 (*)    MCHC 36.1 (*)    All other components within normal limits  DIFFERENTIAL - Abnormal; Notable for the following:    Neutro Abs 9.1 (*)    All other components within normal limits  COMPREHENSIVE METABOLIC PANEL - Abnormal; Notable for the following:    Sodium 123 (*)    Chloride 86 (*)    Glucose, Bld 445 (*)    BUN 31 (*)    Creatinine, Ser 1.86 (*)    AST 14 (*)    ALT 13 (*)    GFR calc non Af Amer 37 (*)    GFR calc Af Amer 42 (*)    All other components within normal limits  TROPONIN I - Abnormal; Notable for the following:    Troponin I 0.08 (*)    All other components within normal limits  CBG MONITORING, ED - Abnormal; Notable for the following:    Glucose-Capillary 476 (*)    All other components within normal limits  CBG MONITORING, ED - Abnormal; Notable for the following:    Glucose-Capillary 474 (*)    All other components within normal limits  PROTIME-INR    EKG  EKG Interpretation  Date/Time:  Friday Feb 18 2017 14:06:42 EDT Ventricular Rate:  80 PR Interval:    QRS Duration: 91 QT Interval:  388 QTC Calculation: 448 R Axis:   -34 Text Interpretation:  Sinus rhythm Left axis deviation Confirmed by Ranae Palms  MD, Amika Tassin ([id number]) on 02/18/2017 2:37:02 PM       Radiology Ct Head Code Stroke W/o Cm  Result Date: 02/18/2017 CLINICAL DATA:  Code stroke. Slurred speech and right-sided weakness. EXAM: CT HEAD WITHOUT CONTRAST TECHNIQUE: Contiguous axial images were obtained from the base of the skull through the vertex without intravenous contrast. COMPARISON:  None FINDINGS: Brain: No evidence of acute infarction, hemorrhage, hydrocephalus, extra-axial collection or mass lesion/mass effect. Vascular: Atherosclerotic calcification.  No hyperdense vessel Skull: No acute or aggressive finding Sinuses/Orbits: Bilateral cataract resection.  No acute finding Other: These results were called by telephone at the time of interpretation on 02/18/2017 at 2:07 pm to Dr. Loren Racer , who verbally acknowledged these results. ASPECTS [facility] Stroke Program Early CT Score) - Ganglionic level infarction (caudate, lentiform nuclei, internal capsule, insula, M1-M3 cortex): 7 - Supraganglionic infarction (M4-M6 cortex): 3 Total score (0-10 with 10 being normal): 10 IMPRESSION: No acute finding.ASPECTS is 10. Electronically Signed   By: Marnee Spring M.D.   On: 02/18/2017 14:09    Procedures Procedures (including critical care time)  Medications Ordered in ED Medications  insulin regular (NOVOLIN R,HUMULIN R) 250 units/2.97mL (100 units/mL) injection 10 Units (10 Units Subcutaneous Given 02/18/17 1433)  sodium chloride 0.9 % bolus 500 mL (0 mLs Intravenous Stopped 02/18/17 1434)    CRITICAL CARE Performed by: Ranae Palms, Gelila Well Total critical care time: 30 minutes Critical care time was exclusive of separately billable procedures and treating other patients. Critical care  was necessary to treat or prevent imminent or life-threatening deterioration. Critical care was time spent personally by me on the following activities: development of treatment plan with patient and/or surrogate as well as nursing, discussions with consultants, evaluation of patient's response to treatment, examination of patient, obtaining history from patient or surrogate, ordering and performing treatments and interventions, ordering and review of laboratory studies, ordering and review of radiographic studies, pulse oximetry and re-evaluation of patient's condition. Initial Impression / Assessment and Plan / ED Course  I have reviewed the triage vital signs and the nursing notes.  Pertinent labs & imaging results that were available during my care of the patient were reviewed by me and considered in my medical decision making (see chart for details).     Discussed with Dr. Roxy Manns. Patient is a TPA candidate. Asked to give TPA if platelets, PT and PTT are normal. Repeat exam. Patient has rapidly improving speech. Mild slurring but close to baseline. No grip strength deficits in the right hand. No noted facial droop. Will hold TPA per Dr. Roxy Manns. Will consult hospitalists regarding admission for TIA.  Dr. Willette Pa will accept the patient in transfer to step down bed. Final Clinical Impressions(s) / ED Diagnoses   Final diagnoses:  Transient cerebral ischemia, unspecified type  Hyperglycemia  Hyponatremia    New Prescriptions New Prescriptions   No medications on file     Loren Racer, MD 02/18/17 1452

## 2017-02-18 NOTE — ED Notes (Signed)
Pt states he went to the doctor for hiccups and these symptoms started at 13:30. Pt states these symptoms are not normal for him.

## 2017-02-18 NOTE — ED Notes (Addendum)
CBG 476. Keli, RN notified.

## 2017-02-18 NOTE — ED Notes (Addendum)
[facility] at bedside 

## 2017-02-18 NOTE — ED Triage Notes (Addendum)
Pt wheeled into triage room by a tech from upstairs who told me pt sugar is over 400 and she doesn't know anything else about him. Pt noted to have slurred speech, weak R arm grip and significant R arm drift on assessment.  Pt taken to room and EDP brought to bedside.

## 2017-02-18 NOTE — ED Notes (Signed)
Patients noted deficients on arrival are clearing  - slurred speech is clearer and patient is able to speak without any trouble. Negative right side drift noted at this time. Patient grips equal.

## 2017-02-18 NOTE — ED Notes (Signed)
Spoke with daughter Majel HomerBett on phone, regarding condition and [facility]

## 2017-02-18 NOTE — H&P (Signed)
History and Physical    Gabriel [name] BJY:[id number] DOB: 1951/07/21 DOA: 02/18/2017   PCP: Danise Edge, MD   Patient coming from:  Home    Chief Complaint: Stroke like symptoms  HPI: Gabriel [name] is a 66 y.o. 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 During the doctors visit, around 1:30 PM, the patient had a sudden onset of slurred speech, right facial droop, as well as right arm weakness. The patient reported tongue numbness. He denies any dysphagia, headaches, blurred vision, or double vision. No confusion was reported. No syncope or presyncope. No falls. He denies any lower extremity weakness. Denies any chest pain or palpitations. Denies shortness of breath or cough.  He denies any nausea or vomiting. He denies any diarrhea. No recent infections or ticks.  He was transferred to the emergency department, at which time, he was to be administered TPA and transferred to [facility] however,symptoms resolved 40 minutes later, prior to the TPA administration and he did not get to receive it. Upon arrival, he is symptom-free for TIA. He is being admitted for further workup.Of note, the patient denies any family history of CVA. He does smoke half pack a day, denies any alcohol or recreational drug use. The patient is compliant with his medications, and takes aspirin a day.    ED Course:  BP (!) 166/67   Pulse 79   Temp 98.4 F (36.9 C) (Oral)   Resp 14   SpO2 97%   CT head negative  CBG was 470 WBC 11, has been bet 10-12   sodium 123 creatinine 1.3 with repeat 1.86  mild elevation of troponin at 0.08  PTT was elevated per chart report  EKG SR with LAD because of elevation of troponin, and TIA symptoms, the  patient is being admitted to step down. 2D echo in December 2014 shows normal systolic function, LAD, EF 60 to 65%, and grade 1 diastolic dysfunction  Review of Systems: As per HPI otherwise 10 point review of systems negative.   Past Medical History:  Diagnosis Date  . Abdominal bloating 09/12/2015  . Allergic state 02/08/2013  . Aortic stenosis   . Arthritis   . Congestive heart failure (HCC)    September 2011-CHF  . Depression   . Diabetes mellitus type 2 in obese (HCC) 04/16/2016  . History of chicken pox   . History of chronic bronchitis    dx 2011  . History of stroke 1992   blood clot at base of brain-[facility]  . Hyperlipidemia   . Hypertension   . Seasonal allergies   . Stroke (HCC)   . Tobacco abuse 11/27/2012   Down from 3 ppd to 1/2 ppd  . Unspecified hereditary and idiopathic peripheral neuropathy 05/26/2014   Tingling in toes    Past Surgical History:  Procedure Laterality Date  . KNEE SURGERY  1976, 1989   right knee torn ligament repair  . LUMBAR FUSION  1982   L4-L5  . REVISION TOTAL KNEE ARTHROPLASTY  2007   right knee replacement    Social History Social History   Social History  . Marital status: Widowed    Spouse name: N/A  . Number of children: 2  . Years of education: 16   Occupational History  .  Security  [employer]   Social History Main Topics  . Smoking status: Current Some Day Smoker    Packs/day: 1.00  . Smokeless tobacco: Never Used     Comment: smokes a pack per day-started in 1972, trying to stop smokes 10 per day.  . Alcohol use No  . Drug use: No  . Sexual activity: No   Other Topics Concern  . Not on file   Social History Narrative   Regular exercise-no   Caffeine Use-yes           No Known Allergies  Family History  Problem Relation Age of Onset  . Arthritis      mother/father/paternal grandparents  . Heart disease Father     pacemaker  . Hypertension Father   . Hypertension Paternal  Grandfather   . Hypertension Brother   . Emotional abuse Mother   . Diabetes Brother      x 2  . Diabetes Paternal Grandmother       Prior to Admission medications   Medication Sig Start Date End Date Taking? Authorizing Provider  albuterol (PROVENTIL HFA;VENTOLIN HFA) 108 (90 BASE) MCG/ACT inhaler Inhale 2 puffs into the lungs every 6 (six) hours as needed for wheezing. 05/29/15 11/23/17  Bradd CanaryStacey A Blyth, MD  amLODipine (NORVASC) 10 MG tablet TAKE 1 TABLET DAILY 01/10/17   Bradd CanaryStacey A Blyth, MD  aspirin 81 MG tablet Take 81 mg by mouth daily.      Historical Provider, MD  atorvastatin (LIPITOR) 40 MG tablet TAKE 1 TABLET DAILY 06/22/16   Bradd CanaryStacey A Blyth, MD  carvedilol (COREG) 25 MG tablet TAKE ONE AND ONE-HALF TABLETS TWICE A DAY 01/05/17   Bradd CanaryStacey A Blyth, MD  chlorproMAZINE (THORAZINE) 25 MG tablet Take 1 tablet (25 mg total) by mouth 3 (three) times daily. 02/18/17   Sharlene DoryNicholas Paul Wendling, DO  Chromium Picolinate 500 MCG TABS Take 500 mcg by mouth daily.    Historical Provider, MD  cyclobenzaprine (FLEXERIL) 10 MG tablet Take 1 tablet (10 mg total) by mouth 3 (three) times daily as needed for muscle spasms. 10/06/16   Jilda RocheNicholas Paul Wendling, DO  fluticasone [facility](FLONASE) 50 MCG/ACT nasal spray Place 2 sprays into both nostrils daily as needed for rhinitis or allergies. 01/15/16   Bradd CanaryStacey A Blyth, MD  furosemide (LASIX) 40 MG tablet Take 0.5 tablets (20 mg total) by mouth daily. 04/16/16   Bradd CanaryStacey A Blyth, MD  gemfibrozil (LOPID) 600 MG tablet TAKE 1 TABLET TWICE A DAY BEFORE MEALS 12/13/16   Bradd CanaryStacey A Blyth, MD  glucose blood (ONETOUCH VERIO) test strip 1 each by Other route 2 (two) times daily. And lancets 2/day 03/10/16   Romero BellingSean Ellison, MD  HYDROcodone-acetaminophen (NORCO/VICODIN) 5-325 MG tablet Take 1 tablet by mouth every 8 (eight) hours as needed for moderate pain. 08/10/16   Waldon MerlWilliam C Martin, PA-C  Insulin Glargine (LANTUS SOLOSTAR) 100 UNIT/ML Solostar Pen Inject 240 Units into the skin daily. 11/12/16    Romero BellingSean Ellison, MD  insulin lispro (HUMALOG KWIKPEN) 100 UNIT/ML KiwkPen Inject 0.4 mLs (40 Units total) into the skin daily with breakfast. 03/10/16   Romero BellingSean Ellison, MD  Insulin Pen Needle 31G X 5 MM MISC USE AS DIRECTED ONE TIME DAILY. 12/31/15   Romero BellingSean Ellison, MD  [place] LaniusKrill Oil CAPS MegaRed or a generic by schiff daily 11/27/12   Bradd CanaryStacey A Blyth, MD  lisinopril (PRINIVIL,ZESTRIL) 40 MG tablet Take 1 tablet (40 mg total) by mouth daily. 04/28/16   Bradd CanaryStacey A Blyth, MD  metFORMIN (  GLUCOPHAGE) 500 MG tablet Take 2 tablets (1,000 mg total) by mouth daily. 04/13/16   Carlus Pavlov, MD  methocarbamol (ROBAXIN) 500 MG tablet TAKE ONE TABLET BY MOUTH TWICE DAILY AS NEEDED FOR PAIN OR MUSCLE SPASM 11/22/16   Bradd Canary, MD  Multiple Vitamin (MULTIVITAMIN) tablet Take 1 tablet by mouth daily.      Historical Provider, MD  nicotine (NICODERM CQ - DOSED IN MG/24 HR) 7 mg/24hr patch Place 1 patch (7 mg total) onto the skin daily. 01/15/16   Bradd Canary, MD  omeprazole (PRILOSEC) 20 MG capsule Take 1 capsule (20 mg total) by mouth daily. 02/18/17   Sharlene Dory, DO  Probiotic Product (PROBIOTIC DAILY PO) Take 1 capsule by mouth daily.    Historical Provider, MD  sertraline (ZOLOFT) 100 MG tablet TAKE ONE AND ONE-HALF TABLETS DAILY 09/20/16   Bradd Canary, MD  triamterene-hydrochlorothiazide [facility]) 37.5-25 MG tablet TAKE 1 TABLET DAILY 11/16/16   Bradd Canary, MD    Physical Exam:  Vitals:   02/18/17 1410 02/18/17 1415 02/18/17 1426 02/18/17 1430  BP: 131/78 97/61 114/67 (!) 166/67  Pulse: 79 79 79   Resp: 14 17 11 14   Temp:      TempSrc:      SpO2: 95% 97% 97%    Constitutional: NAD, calm, comfortable  Eyes: PERRL, lids and conjunctivae normal. Chronic mild inner deviation of the L eye, without causing vision changes ENMT: Mucous membranes are moist, without exudate or lesions  Neck: normal, supple, no masses, no thyromegaly Respiratory:  Bilateral wheezing, no crackles. Normal respiratory  effort  Cardiovascular: Regular rate and rhythm, no murmurs, rubs or gallops. No extremity edema. 2+ pedal pulses. No carotid bruits.  Abdomen:  Obese Soft, non tender, No hepatosplenomegaly. Bowel sounds positive.  Musculoskeletal: no clubbing / cyanosis. Moves all extremities Skin: no jaundice,Known sebaceous cyst on the L posterior trunk. Mild areas of chronic ecchymosis  Neurologic: Sensation intact  Strength equal in all extremities at this time Psychiatric:   Alert and oriented x 3. Normal mood.     Labs on Admission: I have personally reviewed following labs and imaging studies  CBC:  Recent Labs Lab 02/18/17 1400  WBC 11.0*  NEUTROABS 9.1*  HGB 13.7  HCT 37.9*  MCV 88.3  PLT 211    Basic Metabolic Panel:  Recent Labs Lab 02/18/17 1400  NA 123*  K 4.7  CL 86*  CO2 25  GLUCOSE 445*  BUN 31*  CREATININE 1.86*  CALCIUM 9.3    GFR: Estimated Creatinine Clearance: 52.5 mL/min (A) (by C-G formula based on SCr of 1.86 mg/dL (H)).  Liver Function Tests:  Recent Labs Lab 02/18/17 1400  AST 14*  ALT 13*  ALKPHOS 92  BILITOT 0.6  PROT 7.8  ALBUMIN 4.0   No results for input(s): LIPASE, AMYLASE in the last 168 hours. No results for input(s): AMMONIA in the last 168 hours.  Coagulation Profile:  Recent Labs Lab 02/18/17 1400  INR 1.07    Cardiac Enzymes:  Recent Labs Lab 02/18/17 1400  TROPONINI 0.08*    BNP (last 3 results) No results for input(s): PROBNP in the last 8760 hours.  HbA1C: No results for input(s): HGBA1C in the last 72 hours.  CBG:  Recent Labs Lab 02/18/17 1354 02/18/17 1416  GLUCAP 476* 474*    Lipid Profile: No results for input(s): CHOL, HDL, LDLCALC, TRIG, CHOLHDL, LDLDIRECT in the last 72 hours.  Thyroid Function Tests: No results for  input(s): TSH, T4TOTAL, FREET4, T3FREE, THYROIDAB in the last 72 hours.  Anemia Panel: No results for input(s): VITAMINB12, FOLATE, FERRITIN, TIBC, IRON, RETICCTPCT in the  last 72 hours.  Urine analysis: No results found for: COLORURINE, APPEARANCEUR, LABSPEC, PHURINE, GLUCOSEU, HGBUR, BILIRUBINUR, KETONESUR, PROTEINUR, UROBILINOGEN, NITRITE, LEUKOCYTESUR  Sepsis Labs: @LABRCNTIP (procalcitonin:4,lacticidven:4) )No results found for this or any previous visit (from the past 240 hour(s)).   Radiological Exams on Admission: Ct Head Code Stroke W/o Cm  Result Date: 02/18/2017 CLINICAL DATA:  Code stroke. Slurred speech and right-sided weakness. EXAM: CT HEAD WITHOUT CONTRAST TECHNIQUE: Contiguous axial images were obtained from the base of the skull through the vertex without intravenous contrast. COMPARISON:  None FINDINGS: Brain: No evidence of acute infarction, hemorrhage, hydrocephalus, extra-axial collection or mass lesion/mass effect. Vascular: Atherosclerotic calcification.  No hyperdense vessel Skull: No acute or aggressive finding Sinuses/Orbits: Bilateral cataract resection.  No acute finding Other: These results were called by telephone at the time of interpretation on 02/18/2017 at 2:07 pm to Dr. Loren Racer , who verbally acknowledged these results. ASPECTS [facility] Stroke Program Early CT Score) - Ganglionic level infarction (caudate, lentiform nuclei, internal capsule, insula, M1-M3 cortex): 7 - Supraganglionic infarction (M4-M6 cortex): 3 Total score (0-10 with 10 being normal): 10 IMPRESSION: No acute finding.ASPECTS is 10. Electronically Signed   By: Marnee Spring M.D.   On: 02/18/2017 14:09    EKG: Independently reviewed.  Assessment/Plan Active Problems:   TIA (transient ischemic attack)   Stroke-like symptoms   HTN (hypertension)   Obesity   Tobacco abuse   Allergic state   Aortic stenosis   Hyperlipidemia   Hereditary and idiopathic peripheral neuropathy   Diabetes mellitus type 2 in obese (HCC)   Hyponatremia   Elevated troponin   Elevated partial thromboplastin time (PTT)   CKD (chronic kidney disease) stage 3, GFR 30-59 ml/min       Strokelike symptoms with slurred speech, right facial droop, as well as right arm weakness. Prior history of CVA in 1992 without residual  No TPA to date, out of window. Workup in progress.  EKG within limits of normal. Chest xray pending.  Admit to SDU  Cycle troponin Obtain 2 D Echo  A1C Lipid panel  Continue Aspirin and high-dose statin Swallow evaluation Bedside swallow eval and if passes heart healthy diet  Allow permissive Hypertension until MRI brain negative for hemorrhage.  Smoking cessation  Elevated Troponin, currently at 0.08  The patient is asymptomatic.  2D echo is pending. EKG SR with LVH, no ACS Serial Troponin If continues to rise, will consider Cardiology evaluation   Elevated PTT, rule out autoimmune etiology Repeat PTT and will monitor closely. No bleeding issues at this time  Hyponatremia likely due to hyperglycemia and diuretics  No neurological deficits at this time  Corrected Na 132.     Repeat BMET today -  Serum osmolality -  Urine sodium -  Urine osmolality -  TSH- follow BMP Q8 hours -  Cortisol level -  IVF with NS     Hold HCTZ -  free water restriction of 1500 cc per day. Avoid correcting more than 9 meq over 24 hour Consider SIADH w/u if values do not improve  Type II Diabetes Current blood sugar level is 476. REceived 10 U  Insulin now on admission . Anion Gap 10 Lab Results  Component Value Date   HGBA1C 8.1 07/12/2016   Hgb A1C Hold home oral diabetic medications.  Continue Lispro, SSI Heart healthy carb  modified diet.   Acute on chronic Chronic kidney disease stage  III   baseline creatinine1.3-1.5     Current Cr 1.86 Lab Results  Component Value Date   CREATININE 1.86 (H) 02/18/2017   CREATININE 1.49 09/02/2016   CREATININE 1.35 05/10/2016   IVF Hold diuretics and ACEI  Repeat CMET in am   Hypertension Controlled.BP (!) 166/67   Pulse 79   Temp 98.4 F (36.9 C) (Oral)   Resp 14   SpO2 97%  Hold anti-hypertensive  medications a  to create permissive hypertension to help prevent further TIA events  May use hydralazine for greater than 210 systolic or greater than 110 diastolic MAy resume  Home meds in am if no symptoms and  Kidney functions permit  Hyperlipidemia Continue home statins   Leukocytosis, likely reactive, although  His BL bet 10-12 WBC 11  CXR pending  Afebrile     IVF   Repeat CBC in AM Blood cultures   DVT prophylaxis:   SCD's    Code Status:   Full    Family Communication:  Discussed with patient Disposition Plan: Expect patient to be discharged to home after condition improves Consults called:   None Admission status:  SDU    [facility] E, PA-C [facility]   02/18/2017, 4:19 PM

## 2017-02-19 ENCOUNTER — Observation Stay (HOSPITAL_COMMUNITY): Payer: BLUE CROSS/BLUE SHIELD

## 2017-02-19 ENCOUNTER — Observation Stay (HOSPITAL_COMMUNITY): Payer: Self-pay

## 2017-02-19 DIAGNOSIS — E781 Pure hyperglyceridemia: Secondary | ICD-10-CM | POA: Diagnosis present

## 2017-02-19 DIAGNOSIS — I10 Essential (primary) hypertension: Secondary | ICD-10-CM | POA: Diagnosis not present

## 2017-02-19 DIAGNOSIS — E669 Obesity, unspecified: Secondary | ICD-10-CM | POA: Diagnosis present

## 2017-02-19 DIAGNOSIS — E1122 Type 2 diabetes mellitus with diabetic chronic kidney disease: Secondary | ICD-10-CM | POA: Diagnosis present

## 2017-02-19 DIAGNOSIS — I2510 Atherosclerotic heart disease of native coronary artery without angina pectoris: Secondary | ICD-10-CM | POA: Diagnosis present

## 2017-02-19 DIAGNOSIS — E1169 Type 2 diabetes mellitus with other specified complication: Secondary | ICD-10-CM

## 2017-02-19 DIAGNOSIS — E782 Mixed hyperlipidemia: Secondary | ICD-10-CM | POA: Diagnosis not present

## 2017-02-19 DIAGNOSIS — Z794 Long term (current) use of insulin: Secondary | ICD-10-CM

## 2017-02-19 DIAGNOSIS — F17210 Nicotine dependence, cigarettes, uncomplicated: Secondary | ICD-10-CM | POA: Diagnosis present

## 2017-02-19 DIAGNOSIS — E0865 Diabetes mellitus due to underlying condition with hyperglycemia: Secondary | ICD-10-CM | POA: Diagnosis not present

## 2017-02-19 DIAGNOSIS — Z6830 Body mass index (BMI) 30.0-30.9, adult: Secondary | ICD-10-CM | POA: Diagnosis not present

## 2017-02-19 DIAGNOSIS — N179 Acute kidney failure, unspecified: Secondary | ICD-10-CM | POA: Diagnosis present

## 2017-02-19 DIAGNOSIS — I63522 Cerebral infarction due to unspecified occlusion or stenosis of left anterior cerebral artery: Secondary | ICD-10-CM | POA: Diagnosis present

## 2017-02-19 DIAGNOSIS — N183 Chronic kidney disease, stage 3 (moderate): Secondary | ICD-10-CM | POA: Diagnosis present

## 2017-02-19 DIAGNOSIS — I130 Hypertensive heart and chronic kidney disease with heart failure and stage 1 through stage 4 chronic kidney disease, or unspecified chronic kidney disease: Secondary | ICD-10-CM | POA: Diagnosis present

## 2017-02-19 DIAGNOSIS — K219 Gastro-esophageal reflux disease without esophagitis: Secondary | ICD-10-CM | POA: Diagnosis present

## 2017-02-19 DIAGNOSIS — E6609 Other obesity due to excess calories: Secondary | ICD-10-CM | POA: Diagnosis not present

## 2017-02-19 DIAGNOSIS — I639 Cerebral infarction, unspecified: Secondary | ICD-10-CM | POA: Diagnosis not present

## 2017-02-19 DIAGNOSIS — F329 Major depressive disorder, single episode, unspecified: Secondary | ICD-10-CM | POA: Diagnosis present

## 2017-02-19 DIAGNOSIS — I63422 Cerebral infarction due to embolism of left anterior cerebral artery: Secondary | ICD-10-CM | POA: Diagnosis not present

## 2017-02-19 DIAGNOSIS — R4701 Aphasia: Secondary | ICD-10-CM | POA: Diagnosis present

## 2017-02-19 DIAGNOSIS — E1165 Type 2 diabetes mellitus with hyperglycemia: Secondary | ICD-10-CM | POA: Diagnosis present

## 2017-02-19 DIAGNOSIS — L0292 Furuncle, unspecified: Secondary | ICD-10-CM | POA: Diagnosis not present

## 2017-02-19 DIAGNOSIS — E871 Hypo-osmolality and hyponatremia: Secondary | ICD-10-CM

## 2017-02-19 DIAGNOSIS — I5022 Chronic systolic (congestive) heart failure: Secondary | ICD-10-CM | POA: Diagnosis present

## 2017-02-19 DIAGNOSIS — R29810 Facial weakness: Secondary | ICD-10-CM | POA: Diagnosis present

## 2017-02-19 DIAGNOSIS — E1142 Type 2 diabetes mellitus with diabetic polyneuropathy: Secondary | ICD-10-CM | POA: Diagnosis present

## 2017-02-19 DIAGNOSIS — N178 Other acute kidney failure: Secondary | ICD-10-CM | POA: Diagnosis not present

## 2017-02-19 DIAGNOSIS — Z720 Tobacco use: Secondary | ICD-10-CM | POA: Diagnosis not present

## 2017-02-19 DIAGNOSIS — R066 Hiccough: Secondary | ICD-10-CM | POA: Diagnosis present

## 2017-02-19 DIAGNOSIS — R739 Hyperglycemia, unspecified: Secondary | ICD-10-CM | POA: Diagnosis not present

## 2017-02-19 DIAGNOSIS — R748 Abnormal levels of other serum enzymes: Secondary | ICD-10-CM | POA: Diagnosis not present

## 2017-02-19 DIAGNOSIS — N171 Acute kidney failure with acute cortical necrosis: Secondary | ICD-10-CM | POA: Diagnosis not present

## 2017-02-19 DIAGNOSIS — G459 Transient cerebral ischemic attack, unspecified: Secondary | ICD-10-CM | POA: Diagnosis not present

## 2017-02-19 DIAGNOSIS — G609 Hereditary and idiopathic neuropathy, unspecified: Secondary | ICD-10-CM | POA: Diagnosis present

## 2017-02-19 DIAGNOSIS — Z8673 Personal history of transient ischemic attack (TIA), and cerebral infarction without residual deficits: Secondary | ICD-10-CM | POA: Diagnosis not present

## 2017-02-19 DIAGNOSIS — I480 Paroxysmal atrial fibrillation: Secondary | ICD-10-CM | POA: Diagnosis present

## 2017-02-19 DIAGNOSIS — L02222 Furuncle of back [any part, except buttock]: Secondary | ICD-10-CM | POA: Diagnosis present

## 2017-02-19 LAB — BASIC METABOLIC PANEL
Anion gap: 15 (ref 5–15)
BUN: 29 mg/dL — AB (ref 6–20)
CALCIUM: 9.3 mg/dL (ref 8.9–10.3)
CO2: 20 mmol/L — AB (ref 22–32)
Chloride: 91 mmol/L — ABNORMAL LOW (ref 101–111)
Creatinine, Ser: 1.48 mg/dL — ABNORMAL HIGH (ref 0.61–1.24)
GFR calc Af Amer: 56 mL/min — ABNORMAL LOW (ref >60)
GFR, EST NON AFRICAN AMERICAN: 48 mL/min — AB (ref >60)
GLUCOSE: 347 mg/dL — AB (ref 65–99)
POTASSIUM: 4.4 mmol/L (ref 3.5–5.1)
Sodium: 126 mmol/L — ABNORMAL LOW (ref 135–145)

## 2017-02-19 LAB — GLUCOSE, CAPILLARY
GLUCOSE-CAPILLARY: 256 mg/dL — AB (ref 65–99)
Glucose-Capillary: 337 mg/dL — ABNORMAL HIGH (ref 65–99)
Glucose-Capillary: 371 mg/dL — ABNORMAL HIGH (ref 65–99)
Glucose-Capillary: 250 mg/dL — ABNORMAL HIGH (ref 65–99)

## 2017-02-19 LAB — CORTISOL-AM, BLOOD: CORTISOL - AM: 22 ug/dL (ref 6.7–22.6)

## 2017-02-19 LAB — LIPID PANEL
CHOLESTEROL: 168 mg/dL (ref 0–200)
HDL: 19 mg/dL — ABNORMAL LOW (ref >40)
LDL Cholesterol: UNDETERMINED mg/dL (ref 0–99)
Total CHOL/HDL Ratio: 8.8 RATIO
Triglycerides: 427 mg/dL — ABNORMAL HIGH (ref <150)
VLDL: UNDETERMINED mg/dL (ref 0–40)

## 2017-02-19 LAB — TROPONIN I: TROPONIN I: 0.06 ng/mL — AB (ref <0.03)

## 2017-02-19 MED ORDER — INSULIN GLARGINE 100 UNIT/ML ~~LOC~~ SOLN: 140.0000 [IU] | Freq: Every day | SUBCUTANEOUS | Status: DC

## 2017-02-19 MED ORDER — INSULIN ASPART 100 UNIT/ML ~~LOC~~ SOLN
6.0000 [IU] | Freq: Three times a day (TID) | SUBCUTANEOUS | Status: DC
  Administered 2017-02-19 – 2017-02-22 (×9): 6 [IU] via SUBCUTANEOUS

## 2017-02-19 MED ORDER — NICOTINE 14 MG/24HR TD PT24
14.0000 mg | MEDICATED_PATCH | Freq: Every day | TRANSDERMAL | Status: DC
  Administered 2017-02-20 – 2017-02-22 (×3): 14 mg via TRANSDERMAL
  Filled 2017-02-19 (×3): qty 1

## 2017-02-19 MED ORDER — INSULIN GLARGINE 100 UNIT/ML ~~LOC~~ SOLN
120.0000 [IU] | Freq: Two times a day (BID) | SUBCUTANEOUS | Status: DC
  Administered 2017-02-19 – 2017-02-22 (×5): 120 [IU] via SUBCUTANEOUS
  Filled 2017-02-19 (×11): qty 1.2

## 2017-02-19 MED ORDER — INSULIN ASPART 100 UNIT/ML ~~LOC~~ SOLN
0.0000 [IU] | Freq: Every day | SUBCUTANEOUS | Status: DC
  Administered 2017-02-19: 3 [IU] via SUBCUTANEOUS

## 2017-02-19 MED ORDER — INSULIN ASPART 100 UNIT/ML ~~LOC~~ SOLN
0.0000 [IU] | Freq: Three times a day (TID) | SUBCUTANEOUS | Status: DC
  Administered 2017-02-19: 15 [IU] via SUBCUTANEOUS
  Administered 2017-02-19: 20 [IU] via SUBCUTANEOUS
  Administered 2017-02-19: 7 [IU] via SUBCUTANEOUS
  Administered 2017-02-20: 4 [IU] via SUBCUTANEOUS
  Administered 2017-02-20: 3 [IU] via SUBCUTANEOUS
  Administered 2017-02-20 – 2017-02-22 (×4): 4 [IU] via SUBCUTANEOUS

## 2017-02-19 NOTE — Progress Notes (Addendum)
PROGRESS NOTE                                                                                                                                                                                                             Patient Demographics:    Gabriel [name], is a 66 y.o. male, DOB - 05-Sep-1951, XBM:[id number]  Admit date - 02/18/2017   Admitting Physician Lahoma Crocker, MD  Outpatient Primary MD for the patient is Bradd Canary, MD  LOS - 0  Outpatient Specialists:None  Chief Complaint  Patient presents with  . Altered Mental Status       Brief Narrative   66 year old male with history of coronary artery disease, CHF, depression, diabetes mellitus type 2 with neuropathy, ongoing tobacco use, hypertension, hyperlipidemia, 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 While at the doctor's office he had sudden onset of slurred speech with right facial droop as well as right arm weakness and tongue numbness. He was sent to the ED. Since symptom had resolved by then tPA was not administered. Neurology was consulted and patient placed in observation for possible TIA.    Subjective:   Patient denies any further weakness. Has intermittent hiccups which is now improving.   Assessment  & Plan :    Active Problems: Acute ischemic vs embolic stroke MRI brain showing subcentimeter focus of left medial parietal lobe acute/early subacute infarct. Risk factors include ongoing tobacco use, hypertension, hyperlipidemia, diabetes, coronary artery disease and prior stroke history. Appreciate neurology evaluation. Switch aspirin to Plavix. Follow 2-D echo and carotid Dopplers. Stroke team recommends TEE and implantable loop recorder given concern for embolic stroke. High triglycerides. Continue gemfibrozil and added statin. Check A1c. PT/OT and speech eval. Allow  permissive hypertension.  Active problems Hyponatremia Possibly secondary to hyperglycemia and diuretic use. Discontinue diuretics. Patient on high-dose Lantus and pre-meal aspart at home. Will resume. On free water restriction. Follow urine lites and TSH. Cortisol normal.   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 Blood glucose of 470 on admission. Patient is on high-dose Lantus (240 units daily with pre-meal aspart). His CBG is in 300s this morning. Would place him back on his Lantus in divided doses (120 units twice a day) and increase pre-meal aspart. Check  A1c. Follows with Dr Everardo All.  Intractable hiccups Improved with when necessary chlorpromazine  Acute on CKD (chronic kidney disease) stage 3, GFR 30-59 ml/min  Possibly prerenal. Monitor with gentle hydration.  Ongoing tobacco use Counseled on cessation. Nicotine patch ordered.  Elevated troponin No chest pain symptoms or EKG changes. Troponin peaked at 0.08. Monitor on telemetry. Follow echo. Had nuclear study in 09/2013 showed normal perfusion with EF of 39%. Had cardiac cath in 2011 (in [facility]) with 30% right coronary artery stenosis. Continue beta blocker, statin and lisinopril.  Chronic systolic CHF Appears euvolemic. Discontinue diuretic due to hyponatremia and dehydration. On gentle hydration. Monitor I/O and daily weight.   GERD Continue PPI   Depression Continue Zoloft      Aortic stenosis Follow-up with repeat echo.  Hypertriglyceridemia Continue gemfibrozil       Code Status : Full code  Family Communication  : None at bedside  Disposition Plan  : Home after workup completed  Barriers For Discharge : Pending workup  Consults  :   Stroke  Procedures  : MRI brain Head CT  DVT Prophylaxis  :  Lovenox   Lab Results  Component Value Date   PLT 211 02/18/2017    Antibiotics  :  Anti-infectives    None        Objective:    Vitals:   02/18/17 1937 02/18/17 2306 02/19/17 0330 02/19/17 0700  BP: 129/74 (!) 144/78 (!) 166/85 (!) 149/86  Pulse: 80 75 82 92  Resp: 17 14 16 20   Temp: 98.2 F (36.8 C) 98.7 F (37.1 C) 98.5 F (36.9 C) 98.4 F (36.9 C)  TempSrc: Oral Oral Oral Oral  SpO2: 96% 96% 98% 92%    Wt Readings from Last 3 Encounters:  02/18/17 108 kg (238 lb 3.2 oz)  10/06/16 116.2 kg (256 lb 3.2 oz)  09/02/16 118.8 kg (261 lb 12.8 oz)     Intake/Output Summary (Last 24 hours) at 02/19/17 1052 Last data filed at 02/19/17 0957  Gross per 24 hour  Intake          1868.75 ml  Output             1150 ml  Net           718.75 ml     Physical Exam  Gen: not in distress HEENT: moist mucosa, supple neck Chest: clear b/l, no added sounds CVS: N S1&S2, no murmurs,  GI: soft, NT, ND,  Musculoskeletal: warm, no edema CNS: AAOX3, non focal    Data Review:    CBC  Recent Labs Lab 02/18/17 1400  WBC 11.0*  HGB 13.7  HCT 37.9*  PLT 211  MCV 88.3  MCH 31.9  MCHC 36.1*  RDW 12.9  LYMPHSABS 1.0  MONOABS 0.8  EOSABS 0.1  BASOSABS 0.0    Chemistries   Recent Labs Lab 02/18/17 1400 02/18/17 2204 02/19/17 0920  NA 123* 124* 126*  K 4.7 4.1 4.4  CL 86* 91* 91*  CO2 25 21* 20*  GLUCOSE 445* 291* 347*  BUN 31* 32* 29*  CREATININE 1.86* 1.60* 1.48*  CALCIUM 9.3 8.8* 9.3  AST 14*  --   --   ALT 13*  --   --   ALKPHOS 92  --   --   BILITOT 0.6  --   --    ------------------------------------------------------------------------------------------------------------------  Recent Labs  02/19/17 0415  CHOL 168  HDL 19*  LDLCALC UNABLE TO CALCULATE IF TRIGLYCERIDE OVER 400  mg/dL  TRIG [id number]*  CHOLHDL 8.8    Lab Results  Component Value Date   HGBA1C 8.1 07/12/2016   ------------------------------------------------------------------------------------------------------------------  Recent Labs  02/18/17 2204  TSH 0.671    ------------------------------------------------------------------------------------------------------------------ No results for input(s): VITAMINB12, FOLATE, FERRITIN, TIBC, IRON, RETICCTPCT in the last 72 hours.  Coagulation profile  Recent Labs Lab 02/18/17 1400 02/18/17 1706  INR 1.07 1.15    No results for input(s): DDIMER in the last 72 hours.  Cardiac Enzymes  Recent Labs Lab 02/18/17 1706 02/18/17 2204 02/19/17 0415  TROPONINI 0.07* 0.06* 0.06*   ------------------------------------------------------------------------------------------------------------------ No results found for: BNP  Inpatient Medications  Scheduled Meds: .  stroke: mapping our early stages of recovery book   Does not apply Once  . amLODipine  10 mg Oral Daily  . atorvastatin  40 mg Oral q1800  . carvedilol  25 mg Oral BID WC  . chlorproMAZINE  25 mg Oral TID  . clopidogrel  75 mg Oral Daily  . gemfibrozil  600 mg Oral BID AC  . insulin aspart  0-20 Units Subcutaneous TID WC  . insulin aspart  0-5 Units Subcutaneous QHS  . insulin aspart  6 Units Subcutaneous TID WC  . insulin glargine  140 Units Subcutaneous QHS  . lisinopril  40 mg Oral Daily  . nicotine  7 mg Transdermal Daily  . pantoprazole  40 mg Oral Daily  . sertraline  150 mg Oral Daily   Continuous Infusions: . sodium chloride 75 mL/hr at 02/18/17 1645   PRN Meds:.acetaminophen **OR** acetaminophen (TYLENOL) oral liquid 160 mg/5 mL **OR** acetaminophen, albuterol, fluticasone, hydrALAZINE, HYDROcodone-acetaminophen  Micro Results Recent Results (from the past 240 hour(s))  MRSA PCR Screening     Status: None   Collection Time: 02/18/17  6:24 PM  Result Value Ref Range Status   MRSA by PCR NEGATIVE NEGATIVE Final    Comment:        The GeneXpert MRSA Assay (FDA approved for NASAL specimens only), is one component of a comprehensive MRSA colonization surveillance program. It is not intended to diagnose  MRSA infection nor to guide or monitor treatment for MRSA infections.     Radiology Reports Dg Chest 2 View  Result Date: 02/19/2017 CLINICAL DATA:  Acute wheezing EXAM: CHEST  2 VIEW COMPARISON:  05/29/2015 FINDINGS: The heart size and mediastinal contours are within normal limits. Both lungs are clear. The visualized skeletal structures are unremarkable. Lower cervical fusion hardware partially imaged. Very minor thoracic aortic atherosclerosis present. Trachea is midline. No pleural effusion or pneumothorax. IMPRESSION: No active cardiopulmonary disease. Electronically Signed   By: Judie PetitM.  Shick M.D.   On: 02/19/2017 10:11   Mr Brain Wo Contrast  Result Date: 02/18/2017 CLINICAL DATA:  66 y/o  M; slurred speech and weakness. EXAM: MRI HEAD WITHOUT CONTRAST MRA HEAD WITHOUT CONTRAST TECHNIQUE: Multiplanar, multiecho pulse sequences of the brain and surrounding structures were obtained without intravenous contrast. Angiographic images of the head were obtained using MRA technique without contrast. COMPARISON:  02/18/2017 CT of head. FINDINGS: MRI HEAD FINDINGS Brain: Subcentimeter focus of reduced diffusion within the left medial parietal lobe (series 10, image 7). Scattered nonspecific foci of T2 FLAIR hyperintense signal abnormality in subcortical and periventricular white matter compatible with mild chronic microvascular ischemic changes. Mild diffuse brain parenchymal volume loss. No abnormal susceptibility hypointensity to indicate intracranial hemorrhage. No hydrocephalus, extra-axial collection, or focal mass effect. Vascular: As below. Skull and upper cervical spine: Normal marrow signal. Sinuses/Orbits: Negative. Other: Bilateral intra-ocular  lens replacement. MRA HEAD FINDINGS Internal carotid arteries:  Patent. Anterior cerebral arteries:  Patent. Middle cerebral arteries: Patent. Anterior communicating artery: Not identified, likely hypoplastic or absent. Posterior communicating arteries: Not  identified, likely hypoplastic or absent. Posterior cerebral arteries:  Patent. Basilar artery:  Patent. Vertebral arteries:  Patent. No evidence of high-grade stenosis, large vessel occlusion, or aneurysm unless noted above. IMPRESSION: 1. Subcentimeter focus of reduced diffusion within left medial parietal lobe compatible with acute/early subacute infarction. No intracranial hemorrhage. 2. Mild chronic microvascular ischemic changes and mild parenchymal volume loss of the brain. 3. Patent circle of Willis. No large vessel occlusion, aneurysm, or significant stenosis is identified. These results will be called to the ordering clinician or representative by the Radiologist Assistant, and communication documented in the PACS or zVision Dashboard. Electronically Signed   By: Mitzi Hansen M.D.   On: 02/18/2017 21:54   [name] Head/brain WU Cm  Result Date: 02/18/2017 CLINICAL DATA:  66 y/o  M; slurred speech and weakness. EXAM: MRI HEAD WITHOUT CONTRAST MRA HEAD WITHOUT CONTRAST TECHNIQUE: Multiplanar, multiecho pulse sequences of the brain and surrounding structures were obtained without intravenous contrast. Angiographic images of the head were obtained using MRA technique without contrast. COMPARISON:  02/18/2017 CT of head. FINDINGS: MRI HEAD FINDINGS Brain: Subcentimeter focus of reduced diffusion within the left medial parietal lobe (series 10, image 7). Scattered nonspecific foci of T2 FLAIR hyperintense signal abnormality in subcortical and periventricular white matter compatible with mild chronic microvascular ischemic changes. Mild diffuse brain parenchymal volume loss. No abnormal susceptibility hypointensity to indicate intracranial hemorrhage. No hydrocephalus, extra-axial collection, or focal mass effect. Vascular: As below. Skull and upper cervical spine: Normal marrow signal. Sinuses/Orbits: Negative. Other: Bilateral intra-ocular lens replacement. MRA HEAD FINDINGS Internal carotid  arteries:  Patent. Anterior cerebral arteries:  Patent. Middle cerebral arteries: Patent. Anterior communicating artery: Not identified, likely hypoplastic or absent. Posterior communicating arteries: Not identified, likely hypoplastic or absent. Posterior cerebral arteries:  Patent. Basilar artery:  Patent. Vertebral arteries:  Patent. No evidence of high-grade stenosis, large vessel occlusion, or aneurysm unless noted above. IMPRESSION: 1. Subcentimeter focus of reduced diffusion within left medial parietal lobe compatible with acute/early subacute infarction. No intracranial hemorrhage. 2. Mild chronic microvascular ischemic changes and mild parenchymal volume loss of the brain. 3. Patent circle of Willis. No large vessel occlusion, aneurysm, or significant stenosis is identified. These results will be called to the ordering clinician or representative by the Radiologist Assistant, and communication documented in the PACS or zVision Dashboard. Electronically Signed   By: Mitzi Hansen M.D.   On: 02/18/2017 21:54   Ct Head Code Stroke W/o Cm  Result Date: 02/18/2017 CLINICAL DATA:  Code stroke. Slurred speech and right-sided weakness. EXAM: CT HEAD WITHOUT CONTRAST TECHNIQUE: Contiguous axial images were obtained from the base of the skull through the vertex without intravenous contrast. COMPARISON:  None FINDINGS: Brain: No evidence of acute infarction, hemorrhage, hydrocephalus, extra-axial collection or mass lesion/mass effect. Vascular: Atherosclerotic calcification.  No hyperdense vessel Skull: No acute or aggressive finding Sinuses/Orbits: Bilateral cataract resection.  No acute finding Other: These results were called by telephone at the time of interpretation on 02/18/2017 at 2:07 pm to Dr. Loren Racer , who verbally acknowledged these results. ASPECTS [facility] Stroke Program Early CT Score) - Ganglionic level infarction (caudate, lentiform nuclei, internal capsule, insula, M1-M3 cortex): 7  - Supraganglionic infarction (M4-M6 cortex): 3 Total score (0-10 with 10 being normal): 10 IMPRESSION: No acute finding.ASPECTS is 10. Electronically  Signed   By: Marnee Spring M.D.   On: 02/18/2017 14:09    Time Spent in minutes  25   Eddie North M.D on 02/19/2017 at 10:52 AM  Between 7am to 7pm - Pager - [phone]  After 7pm go to [email or web address] - password [email or web address]  [facility] -  Office  [phone]

## 2017-02-19 NOTE — Progress Notes (Signed)
*  Preliminary Results* Bilateral lower extremity venous duplex completed. Bilateral lower extremities are negative for deep vein thrombosis. There is no evidence of Baker's cyst bilaterally.  02/19/2017 3:33 PM Gertie FeyMichelle Dominico Rod, BS, RVT, RDCS, RDMS

## 2017-02-19 NOTE — Progress Notes (Signed)
STROKE TEAM PROGRESS NOTE   HISTORY OF PRESENT ILLNESS (per record) This is a 66 year old right-handed man who is admitted to [facility] for evaluation of presumed TIA. History is obtained directly from the patient who is a good historian. I've also reviewed his records at length.  The patient reports that he's had persistent hiccups for the past couple of weeks. He was at his doctor's office today to be evaluated for these. While in the office at about 1:30, he stated that he suddenly developed difficulty speaking. His describes this as being unable to get his words out. This is associated with right arm weakness and possibly right facial droop. He was sent to the emergency department at [facility] On evaluation by the ED physician, he was noted to have 2/5 strength in the right hand with slurred speech and a right lower facial droop. He was not noted to have any ataxia or sensory changes. Emergent CT of the head was obtained and showed no acute abnormality. I was contacted by the ED attending at that time and recommended administration of thrombolytic therapy with TPA if no other contraindications were identified. A few minutes later, I was again contacted by the ED physician who reports that the patient had resolution of symptoms. As such, thrombotic therapy was deferred and the patient has now been transferred for further evaluation of apparent TIA. He continues to remain free of symptoms apart from persistent hiccups and feels that he is currently at his neurologic baseline.  The patient reports a remote history of stroke in 57. He states at that time, he had weakness and numbness of the right side. He says that all his function returned with the exception of some persistent sensory loss on the right side. He states that he does not feel extremes of hot or cold on that side.   SUBJECTIVE (INTERVAL HISTORY) His RN is at the bedside.  Pt stated that he is back to baseline. His  right sided weakness and speech difficulty have resolved. His MRI findings not able to explain his symptoms. Will need TEE and loop and pt agrees with that.    OBJECTIVE Temp:  [98.2 F (36.8 C)-99 F (37.2 C)] 98.4 F (36.9 C) (05/05 0700) Pulse Rate:  [73-92] 92 (05/05 0700) Cardiac Rhythm: Normal sinus rhythm (05/05 0830) Resp:  [11-20] 20 (05/05 0700) BP: (97-166)/(61-89) 149/86 (05/05 0700) SpO2:  [92 %-98 %] 92 % (05/05 0700) Weight:  [108 kg (238 lb 3.2 oz)] 108 kg (238 lb 3.2 oz) (05/04 1311)  CBC:   Recent Labs Lab 02/18/17 1400  WBC 11.0*  NEUTROABS 9.1*  HGB 13.7  HCT 37.9*  MCV 88.3  PLT 211    Basic Metabolic Panel:   Recent Labs Lab 02/18/17 1400 02/18/17 2204  NA 123* 124*  K 4.7 4.1  CL 86* 91*  CO2 25 21*  GLUCOSE 445* 291*  BUN 31* 32*  CREATININE 1.86* 1.60*  CALCIUM 9.3 8.8*    Lipid Panel:     Component Value Date/Time   CHOL 168 02/19/2017 0415   TRIG 427 (H) 02/19/2017 0415   HDL 19 (L) 02/19/2017 0415   CHOLHDL 8.8 02/19/2017 0415   VLDL UNABLE TO CALCULATE IF TRIGLYCERIDE OVER 400 mg/dL 78/29/5621 3086   LDLCALC UNABLE TO CALCULATE IF TRIGLYCERIDE OVER 400 mg/dL 57/84/6962 9528   UXLK4M:  Lab Results  Component Value Date   HGBA1C 8.1 07/12/2016   Urine Drug Screen:     Component  Value Date/Time   LABOPIA NONE DETECTED 02/18/2017 1933   COCAINSCRNUR NONE DETECTED 02/18/2017 1933   LABBENZ NONE DETECTED 02/18/2017 1933   AMPHETMU NONE DETECTED 02/18/2017 1933   THCU NONE DETECTED 02/18/2017 1933   LABBARB NONE DETECTED 02/18/2017 1933    Alcohol Level No results found for: ETH  IMAGING I have personally reviewed the radiological images below and agree with the radiology interpretations.  Mr Gabriel [name] Head/brain Wo Cm 02/18/2017 1. Subcentimeter focus of reduced diffusion within left medial parietal lobe compatible with acute/early subacute infarction. No intracranial hemorrhage.  2. Mild chronic microvascular ischemic  changes and mild parenchymal volume loss of the brain.  3. Patent circle of Willis. No large vessel occlusion, aneurysm, or significant stenosis is identified.   Ct Head Code Stroke W/o Cm 02/18/2017 No acute finding. ASPECTS is 10.   TTE pending  CUS pending   LE venous doppler pending   PHYSICAL EXAM  Temp:  [98.2 F (36.8 C)-99 F (37.2 C)] 98.4 F (36.9 C) (05/05 0700) Pulse Rate:  [73-105] 105 (05/05 1139) Resp:  [11-20] 14 (05/05 1139) BP: (97-168)/(61-89) 168/79 (05/05 1139) SpO2:  [92 %-98 %] 96 % (05/05 1139) Weight:  [238 lb 3.2 oz (108 kg)] 238 lb 3.2 oz (108 kg) (05/04 1311)  General - Well nourished, well developed, in no apparent distress.  Ophthalmologic - Sharp disc margins OU.   Cardiovascular - Regular rate and rhythm.  Mental Status -  Level of arousal and orientation to time, place, and person were intact. Language including expression, naming, repetition, comprehension was assessed and found intact. Fund of Knowledge was assessed and was intact.  Cranial Nerves II - XII - II - Visual field intact OU. III, IV, VI - Extraocular movements intact. V - Facial sensation intact bilaterally. VII - Facial movement intact bilaterally. VIII - Hearing & vestibular intact bilaterally. X - Palate elevates symmetrically. XI - Chin turning & shoulder shrug intact bilaterally. XII - Tongue protrusion intact.  Motor Strength - The patient's strength was normal in all extremities and pronator drift was absent.  Bulk was normal and fasciculations were absent.   Motor Tone - Muscle tone was assessed at the neck and appendages and was normal.  Reflexes - The patient's reflexes were 1+ in all extremities and he had no pathological reflexes.  Sensory - Light touch, temperature/pinprick were assessed and were symmetrical.    Coordination - The patient had normal movements in the hands and feet with no ataxia or dysmetria.  Tremor was absent.  Gait and Station -  deferred    ASSESSMENT/PLAN [name] is a 66 y.o. male with history of a previous stroke, tobacco use, hypertension, hyperlipidemia, diabetes mellitus, congestive heart failure, and aortic stenosis presenting with inability to speak with right sided weakness. He did not receive IV t-PA due to resolution of deficits.  Stroke:  punctate Lt medial parietal infarct in the left ACA territory, which can not explain pt symptoms. Pt aphasia and right sided weakness concerning for right MCA involvement. Recommend TEE and loop.   Resultant - resolution of deficits  MRI - Subcentimeter focus of reduced diffusion within left medial parietal lobe   MRA - No large vessel occlusion, aneurysm, or significant stenosis is identified.   Carotid Doppler- pending  2D Echo - pending  LE venous doppler - pending  Recommend TEE and loop recorder for further work up if above tests unrevealing.   LDL - unable to calculate secondary to triglycerides greater than  400  HgbA1c - pending  UDS negative  VTE prophylaxis - SCDs Diet Carb Modified Fluid consistency: Thin; Room service appropriate? Yes; Fluid restriction: 1500 mL Fluid Diet NPO time specified Except for: Sips with Meds  aspirin 81 mg daily prior to admission, now on clopidogrel 75 mg daily  Patient counseled to be compliant with his antithrombotic medications  Ongoing aggressive stroke risk factor management  Therapy recommendations: pending  Disposition: Pending  Hypertension  Stable  Permissive hypertension (OK if < 220/120) but gradually normalize in 5-7 days  Long-term BP goal normotensive  Hyperlipidemia  Home meds: Lipitor 40 mg daily resumed in hospital   On Lopid for triglycerides  LDL unable to calculate due to high TG, goal < 70  Continue lipitor and lopid at discharge  Diabetes  HgbA1c pending, goal < 7.0  Uncontrolled  CBG high  On lantus  SSI  Tobacco abuse  Current smoker  Smoking  cessation counseling provided  Nicotine patch provided  Pt is willing to quit  Other Stroke Risk Factors  Advanced age  Hx stroke/TIA in 41992 with right side deficit but resolved and no residue PTA  Other Active Problems  Mild leukocytosis - 11.0 - afebrile  Likely pseudo hyponatremia - 124 - due to high glucose  Kidney disease - 32 / 1.60  Hospital day # 0  Marvel PlanJindong Krystine Pabst, MD PhD Stroke Neurology 02/19/2017 2:59 PM    To contact Stroke Continuity provider, please refer to [email or web address]. After hours, contact General Neurology

## 2017-02-19 NOTE — Progress Notes (Signed)
Pt transferred to . Report given to admitting nurse. All questions answered.

## 2017-02-20 ENCOUNTER — Other Ambulatory Visit (HOSPITAL_COMMUNITY): Payer: Self-pay

## 2017-02-20 ENCOUNTER — Inpatient Hospital Stay (HOSPITAL_COMMUNITY): Payer: BLUE CROSS/BLUE SHIELD

## 2017-02-20 ENCOUNTER — Inpatient Hospital Stay (HOSPITAL_COMMUNITY): Payer: Self-pay

## 2017-02-20 DIAGNOSIS — N183 Chronic kidney disease, stage 3 (moderate): Secondary | ICD-10-CM

## 2017-02-20 DIAGNOSIS — I10 Essential (primary) hypertension: Secondary | ICD-10-CM

## 2017-02-20 DIAGNOSIS — R748 Abnormal levels of other serum enzymes: Secondary | ICD-10-CM

## 2017-02-20 DIAGNOSIS — E871 Hypo-osmolality and hyponatremia: Secondary | ICD-10-CM

## 2017-02-20 DIAGNOSIS — I639 Cerebral infarction, unspecified: Secondary | ICD-10-CM

## 2017-02-20 DIAGNOSIS — R739 Hyperglycemia, unspecified: Secondary | ICD-10-CM

## 2017-02-20 DIAGNOSIS — N178 Other acute kidney failure: Secondary | ICD-10-CM

## 2017-02-20 LAB — BASIC METABOLIC PANEL
ANION GAP: 11 (ref 5–15)
BUN: 38 mg/dL — AB (ref 6–20)
CO2: 24 mmol/L (ref 22–32)
Calcium: 9.2 mg/dL (ref 8.9–10.3)
Chloride: 93 mmol/L — ABNORMAL LOW (ref 101–111)
Creatinine, Ser: 1.87 mg/dL — ABNORMAL HIGH (ref 0.61–1.24)
GFR calc Af Amer: 42 mL/min — ABNORMAL LOW (ref >60)
GFR, EST NON AFRICAN AMERICAN: 36 mL/min — AB (ref >60)
Glucose, Bld: 176 mg/dL — ABNORMAL HIGH (ref 65–99)
POTASSIUM: 4.2 mmol/L (ref 3.5–5.1)
SODIUM: 128 mmol/L — AB (ref 135–145)

## 2017-02-20 LAB — GLUCOSE, CAPILLARY
GLUCOSE-CAPILLARY: 176 mg/dL — AB (ref 65–99)
GLUCOSE-CAPILLARY: 83 mg/dL (ref 65–99)
Glucose-Capillary: 187 mg/dL — ABNORMAL HIGH (ref 65–99)
Glucose-Capillary: 131 mg/dL — ABNORMAL HIGH (ref 65–99)

## 2017-02-20 LAB — OSMOLALITY, URINE: Osmolality, Ur: 323 mOsm/kg (ref 300–900)

## 2017-02-20 LAB — CREATININE, URINE, RANDOM: Creatinine, Urine: 208.13 mg/dL

## 2017-02-20 LAB — SODIUM, URINE, RANDOM: Sodium, Ur: <10 mmol/L

## 2017-02-20 MED ORDER — AMOXICILLIN-POT CLAVULANATE 875-125 MG PO TABS
1.0000 | ORAL_TABLET | Freq: Two times a day (BID) | ORAL | Status: DC
  Administered 2017-02-20 – 2017-02-22 (×4): 1 via ORAL
  Filled 2017-02-20 (×4): qty 1

## 2017-02-20 NOTE — Progress Notes (Signed)
VASCULAR LAB PRELIMINARY  PRELIMINARY  PRELIMINARY  PRELIMINARY  Carotid duplex completed.    Preliminary report:  1-39% ICA stenosis.  Vertebral artery flow is antegrade.   Gabriel [name], RVT 02/20/2017, 10:36 AM

## 2017-02-20 NOTE — Progress Notes (Signed)
STROKE TEAM PROGRESS NOTE   SUBJECTIVE (INTERVAL HISTORY) His RN is at the bedside.  Pt had CUS and LE venous doppler which are all negative. Pending TEE and loop. No complains over night.   OBJECTIVE Temp:  [98.4 F (36.9 C)-100.6 F (38.1 C)] 99 F (37.2 C) (05/06 0549) Pulse Rate:  [81-88] 88 (05/06 0549) Cardiac Rhythm: Sinus tachycardia (05/05 2020) Resp:  [16-20] 20 (05/06 0549) BP: (105-157)/(62-81) 157/81 (05/06 0549) SpO2:  [97 %-100 %] 99 % (05/06 0549)  CBC:   Recent Labs Lab 02/18/17 1400  WBC 11.0*  NEUTROABS 9.1*  HGB 13.7  HCT 37.9*  MCV 88.3  PLT 211    Basic Metabolic Panel:   Recent Labs Lab 02/19/17 0920 02/20/17 0427  NA 126* 128*  K 4.4 4.2  CL 91* 93*  CO2 20* 24  GLUCOSE 347* 176*  BUN 29* 38*  CREATININE 1.48* 1.87*  CALCIUM 9.3 9.2    Lipid Panel:     Component Value Date/Time   CHOL 168 02/19/2017 0415   TRIG 427 (H) 02/19/2017 0415   HDL 19 (L) 02/19/2017 0415   CHOLHDL 8.8 02/19/2017 0415   VLDL UNABLE TO CALCULATE IF TRIGLYCERIDE OVER 400 mg/dL 16/10/960405/02/2017 54090415   LDLCALC UNABLE TO CALCULATE IF TRIGLYCERIDE OVER 400 mg/dL 81/19/147805/02/2017 [id number]   [id number]:  Lab Results  Component Value Date   HGBA1C 8.1 07/12/2016   Urine Drug Screen:     Component Value Date/Time   LABOPIA NONE DETECTED 02/18/2017 1933   COCAINSCRNUR NONE DETECTED 02/18/2017 1933   LABBENZ NONE DETECTED 02/18/2017 1933   AMPHETMU NONE DETECTED 02/18/2017 1933   THCU NONE DETECTED 02/18/2017 1933   LABBARB NONE DETECTED 02/18/2017 1933    Alcohol Level No results found for: ETH  IMAGING I have personally reviewed the radiological images below and agree with the radiology interpretations.  Mr Gabriel GlennMra Head/brain Wo Cm 02/18/2017 1. Subcentimeter focus of reduced diffusion within left medial parietal lobe compatible with acute/early subacute infarction. No intracranial hemorrhage.  2. Mild chronic microvascular ischemic changes and mild parenchymal volume loss of  the brain.  3. Patent circle of Willis. No large vessel occlusion, aneurysm, or significant stenosis is identified.   Ct Head Code Stroke W/o Cm 02/18/2017 No acute finding. ASPECTS is 10.   TTE pending  TEE pending  CUS - Preliminary report:  1-39% ICA stenosis.  Vertebral artery flow is antegrade.   LE venous doppler - negative for deep vein thrombosis   PHYSICAL EXAM  Temp:  [98.4 F (36.9 C)-100.6 F (38.1 C)] 99 F (37.2 C) (05/06 0549) Pulse Rate:  [81-88] 88 (05/06 0549) Resp:  [16-20] 20 (05/06 0549) BP: (105-157)/(62-81) 157/81 (05/06 0549) SpO2:  [97 %-100 %] 99 % (05/06 0549)  General - Well nourished, well developed, in no apparent distress.  Ophthalmologic - Sharp disc margins OU.   Cardiovascular - Regular rate and rhythm.  Mental Status -  Level of arousal and orientation to time, place, and person were intact. Language including expression, naming, repetition, comprehension was assessed and found intact. Fund of Knowledge was assessed and was intact.  Cranial Nerves II - XII - II - Visual field intact OU. III, IV, VI - Extraocular movements intact. V - Facial sensation intact bilaterally. VII - Facial movement intact bilaterally. VIII - Hearing & vestibular intact bilaterally. X - Palate elevates symmetrically. XI - Chin turning & shoulder shrug intact bilaterally. XII - Tongue protrusion intact.  Motor Strength - The patient's strength was  normal in all extremities and pronator drift was absent.  Bulk was normal and fasciculations were absent.   Motor Tone - Muscle tone was assessed at the neck and appendages and was normal.  Reflexes - The patient's reflexes were 1+ in all extremities and he had no pathological reflexes.  Sensory - Light touch, temperature/pinprick were assessed and were symmetrical.    Coordination - The patient had normal movements in the hands and feet with no ataxia or dysmetria.  Tremor was absent.  Gait and Station -  deferred    ASSESSMENT/PLAN [name] is a 66 y.o. male with history of a previous stroke, tobacco use, hypertension, hyperlipidemia, diabetes mellitus, congestive heart failure, and aortic stenosis presenting with inability to speak with right sided weakness. He did not receive IV t-PA due to resolution of deficits.  Stroke:  punctate Lt medial parietal infarct in the left ACA territory, which can not explain pt symptoms. Pt aphasia and right sided weakness concerning for right MCA involvement. Recommend TEE and loop.   Resultant - resolution of deficits  MRI - Subcentimeter focus of reduced diffusion within left medial parietal lobe   MRA - No large vessel occlusion, aneurysm, or significant stenosis is identified.   Carotid Doppler - 1-39% ICA stenosis.  Vertebral artery flow is antegrade.   2D Echo - pending   LE venous doppler - negative for DVT  Recommend TEE and loop recorder for further work up if above tests unrevealing.   LDL - unable to calculate secondary to triglycerides greater than 400  HgbA1c - pending  UDS negative  VTE prophylaxis - SCDs Diet Carb Modified Fluid consistency: Thin; Room service appropriate? Yes; Fluid restriction: 1500 mL Fluid Diet NPO time specified Except for: Sips with Meds Diet NPO time specified  aspirin 81 mg daily prior to admission, now on clopidogrel 75 mg daily  Patient counseled to be compliant with his antithrombotic medications  Ongoing aggressive stroke risk factor management  Therapy recommendations: pending  Disposition: Pending  Hypertension  Stable  Permissive hypertension (OK if < 220/120) but gradually normalize in 5-7 days  Long-term BP goal normotensive  Hyperlipidemia  Home meds: Lipitor 40 mg daily resumed in hospital   On Lopid for triglycerides  LDL unable to calculate due to high TG, goal < 70  Continue lipitor and lopid at discharge  Diabetes  HgbA1c pending, goal <  7.0  Uncontrolled  CBG high  On lantus  SSI  Tobacco abuse  Current smoker  Smoking cessation counseling provided  Nicotine patch provided  Pt is willing to quit  Other Stroke Risk Factors  Advanced age  Hx stroke/TIA in 23 with right side deficit but resolved and no residue PTA  Other Active Problems  Mild leukocytosis - 11.0 - afebrile  Likely pseudo hyponatremia - 124->128 - due to high glucose  Kidney disease - creatinine - 1.6 -> 1.48 -> 1.87  Hospital day # 1  Marvel Plan, MD PhD Stroke Neurology 02/20/2017 12:08 PM   To contact Stroke Continuity provider, please refer to [email or web address]. After hours, contact General Neurology

## 2017-02-20 NOTE — Progress Notes (Addendum)
PROGRESS NOTE                                                                                                                                                                                                             Patient Demographics:    Gabriel [name], is a 66 y.o. male, DOB - 06-Jun-1951, ONG:[id number]  Admit date - 02/18/2017   Admitting Physician Lahoma Crocker, MD  Outpatient Primary MD for the patient is Bradd Canary, MD  LOS - 1  Outpatient Specialists:None  Chief Complaint  Patient presents with  . Altered Mental Status       Brief Narrative   66 year old male with history of coronary artery disease, CHF, depression, diabetes mellitus type 2 with neuropathy, ongoing tobacco use, hypertension, hyperlipidemia, 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 While at the doctor's office he had sudden onset of slurred speech with right facial droop as well as right arm weakness and tongue numbness. He was sent to the ED. Since symptom had resolved by then tPA was not administered. Neurology was consulted and patient placed in observation for possible TIA.    Subjective:   Other hiccups. Complains of mild pain in his ears. Had a fever of 100.76F overnight.   Assessment  & Plan :    Active Problems: Acute ischemic vs embolic stroke MRI brain showing subcentimeter focus of left medial parietal lobe acute/early subacute infarct. Risk factors include ongoing tobacco use, hypertension, hyperlipidemia, diabetes, coronary artery disease and prior stroke history. Appreciate neurology evaluation. Switched aspirin to Plavix. Carotid Doppler without significant stenosis. Doppler lower extremities negative for DVT. 2-D echo pending. Stroke team recommends TEE and implantable loop recorder given concern for embolic stroke. Cardiology consulted. High  triglycerides. Continue gemfibrozil and added statin. A1c pending. PT/OT and speech eval. Allow permissive hypertension.  Active problems Hyponatremia Possibly secondary to hyperglycemia and diuretic use. Diuretics held. TSH normal. Monitor on gentle hydration.   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 Blood glucose of 470 on admission. Patient is on high-dose Lantus (240 units daily with pre-meal aspart). Placed him back on his Lantus in divided doses (120 units twice a day) and increase pre-meal aspart. CBG improved. A1c pending. Follows with Dr Everardo All.  Intractable hiccups Improved  with when necessary chlorpromazine  Acute on CKD (chronic kidney disease) stage 3, GFR 30-59 ml/min  Possibly prerenal. Monitor with gentle hydration.   Ongoing tobacco use Counseled on cessation. Nicotine patch ordered.  Elevated troponin No chest pain symptoms or EKG changes. Troponin peaked at 0.08. Monitor on telemetry. Follow echo. Had nuclear study in 09/2013 showed normal perfusion with EF of 39%. Had cardiac cath in 2011 (in [facility]) with 30% right coronary artery stenosis. Continue beta blocker, statin. Hold lisinopril.  Chronic systolic CHF Appears euvolemic. Discontinue diuretic and lisinopril due to hyponatremia and AKI.  Continue gentle hydration. Monitor I/O and daily weight.   GERD Continue PPI   Depression Continue Zoloft      Aortic stenosis Follow-up with repeat echo.  Hypertriglyceridemia Continue gemfibrozil  Fever No clear etiology. Complains of some ear pain bilaterally. Will monitor for now. Chest x-ray unremarkable.     Code Status : Full code  Family Communication  : None at bedside  Disposition Plan  : Home after workup completed  Barriers For Discharge : Pending workup  Consults  :   Stroke  Procedures  : MRI brain Head CT  DVT Prophylaxis  :  Lovenox   Lab Results  Component Value Date   PLT  211 02/18/2017    Antibiotics  :  Anti-infectives    None        Objective:   Vitals:   02/19/17 1754 02/19/17 2019 02/20/17 0134 02/20/17 0549  BP: 105/67 109/68 134/65 (!) 157/81  Pulse: 85  81 88  Resp: 18 18 20 20   Temp: 98.4 F (36.9 C) 98.4 F (36.9 C) (!) 100.6 F (38.1 C) 99 F (37.2 C)  TempSrc: Oral Oral Oral Oral  SpO2: 98%  100% 99%    Wt Readings from Last 3 Encounters:  02/18/17 108 kg (238 lb 3.2 oz)  10/06/16 116.2 kg (256 lb 3.2 oz)  09/02/16 118.8 kg (261 lb 12.8 oz)     Intake/Output Summary (Last 24 hours) at 02/20/17 1115 Last data filed at 02/20/17 0813  Gross per 24 hour  Intake              360 ml  Output              250 ml  Net              110 ml     Physical Exam  Gen: not in distress HEENT: moist mucosa, supple neck Chest: Clear to auscultation bilaterally CVS: N S1&S2, no murmurs,  GI: soft, NT, ND,  Musculoskeletal: warm, no edema CNS: Alert and oriented, no focal deficit    Data Review:    CBC  Recent Labs Lab 02/18/17 1400  WBC 11.0*  HGB 13.7  HCT 37.9*  PLT 211  MCV 88.3  MCH 31.9  MCHC 36.1*  RDW 12.9  LYMPHSABS 1.0  MONOABS 0.8  EOSABS 0.1  BASOSABS 0.0    Chemistries   Recent Labs Lab 02/18/17 1400 02/18/17 2204 02/19/17 0920 02/20/17 0427  NA 123* 124* 126* 128*  K 4.7 4.1 4.4 4.2  CL 86* 91* 91* 93*  CO2 25 21* 20* 24  GLUCOSE 445* 291* 347* 176*  BUN 31* 32* 29* 38*  CREATININE 1.86* 1.60* 1.48* 1.87*  CALCIUM 9.3 8.8* 9.3 9.2  AST 14*  --   --   --   ALT 13*  --   --   --   ALKPHOS 92  --   --   --  BILITOT 0.6  --   --   --    ------------------------------------------------------------------------------------------------------------------  Recent Labs  02/19/17 0415  CHOL 168  HDL 19*  LDLCALC UNABLE TO CALCULATE IF TRIGLYCERIDE OVER 400 mg/dL  TRIG 161*  CHOLHDL 8.8    Lab Results  Component Value Date   HGBA1C 8.1 07/12/2016    ------------------------------------------------------------------------------------------------------------------  Recent Labs  02/18/17 2204  TSH 0.671   ------------------------------------------------------------------------------------------------------------------ No results for input(s): VITAMINB12, FOLATE, FERRITIN, TIBC, IRON, RETICCTPCT in the last 72 hours.  Coagulation profile  Recent Labs Lab 02/18/17 1400 02/18/17 1706  INR 1.07 1.15    No results for input(s): DDIMER in the last 72 hours.  Cardiac Enzymes  Recent Labs Lab 02/18/17 1706 02/18/17 2204 02/19/17 0415  TROPONINI 0.07* 0.06* 0.06*   ------------------------------------------------------------------------------------------------------------------ No results found for: BNP  Inpatient Medications  Scheduled Meds: .  stroke: mapping our early stages of recovery book   Does not apply Once  . amLODipine  10 mg Oral Daily  . atorvastatin  40 mg Oral q1800  . carvedilol  25 mg Oral BID WC  . chlorproMAZINE  25 mg Oral TID  . clopidogrel  75 mg Oral Daily  . gemfibrozil  600 mg Oral BID AC  . insulin aspart  0-20 Units Subcutaneous TID WC  . insulin aspart  0-5 Units Subcutaneous QHS  . insulin aspart  6 Units Subcutaneous TID WC  . insulin glargine  120 Units Subcutaneous BID  . lisinopril  40 mg Oral Daily  . nicotine  14 mg Transdermal Daily  . pantoprazole  40 mg Oral Daily  . sertraline  150 mg Oral Daily   Continuous Infusions: . sodium chloride Stopped (02/19/17 1122)   PRN Meds:.acetaminophen **OR** acetaminophen (TYLENOL) oral liquid 160 mg/5 mL **OR** acetaminophen, albuterol, fluticasone, hydrALAZINE, HYDROcodone-acetaminophen  Micro Results Recent Results (from the past 240 hour(s))  MRSA PCR Screening     Status: None   Collection Time: 02/18/17  6:24 PM  Result Value Ref Range Status   MRSA by PCR NEGATIVE NEGATIVE Final    Comment:        The GeneXpert MRSA Assay  (FDA approved for NASAL specimens only), is one component of a comprehensive MRSA colonization surveillance program. It is not intended to diagnose MRSA infection nor to guide or monitor treatment for MRSA infections.     Radiology Reports Dg Chest 2 View  Result Date: 02/19/2017 CLINICAL DATA:  Acute wheezing EXAM: CHEST  2 VIEW COMPARISON:  05/29/2015 FINDINGS: The heart size and mediastinal contours are within normal limits. Both lungs are clear. The visualized skeletal structures are unremarkable. Lower cervical fusion hardware partially imaged. Very minor thoracic aortic atherosclerosis present. Trachea is midline. No pleural effusion or pneumothorax. IMPRESSION: No active cardiopulmonary disease. Electronically Signed   By: Judie Petit.  Shick M.D.   On: 02/19/2017 10:11   Mr Brain Wo Contrast  Result Date: 02/18/2017 CLINICAL DATA:  66 y/o  M; slurred speech and weakness. EXAM: MRI HEAD WITHOUT CONTRAST MRA HEAD WITHOUT CONTRAST TECHNIQUE: Multiplanar, multiecho pulse sequences of the brain and surrounding structures were obtained without intravenous contrast. Angiographic images of the head were obtained using MRA technique without contrast. COMPARISON:  02/18/2017 CT of head. FINDINGS: MRI HEAD FINDINGS Brain: Subcentimeter focus of reduced diffusion within the left medial parietal lobe (series 10, image 7). Scattered nonspecific foci of T2 FLAIR hyperintense signal abnormality in subcortical and periventricular white matter compatible with mild chronic microvascular ischemic changes. Mild diffuse brain parenchymal volume  loss. No abnormal susceptibility hypointensity to indicate intracranial hemorrhage. No hydrocephalus, extra-axial collection, or focal mass effect. Vascular: As below. Skull and upper cervical spine: Normal marrow signal. Sinuses/Orbits: Negative. Other: Bilateral intra-ocular lens replacement. MRA HEAD FINDINGS Internal carotid arteries:  Patent. Anterior cerebral arteries:   Patent. Middle cerebral arteries: Patent. Anterior communicating artery: Not identified, likely hypoplastic or absent. Posterior communicating arteries: Not identified, likely hypoplastic or absent. Posterior cerebral arteries:  Patent. Basilar artery:  Patent. Vertebral arteries:  Patent. No evidence of high-grade stenosis, large vessel occlusion, or aneurysm unless noted above. IMPRESSION: 1. Subcentimeter focus of reduced diffusion within left medial parietal lobe compatible with acute/early subacute infarction. No intracranial hemorrhage. 2. Mild chronic microvascular ischemic changes and mild parenchymal volume loss of the brain. 3. Patent circle of Willis. No large vessel occlusion, aneurysm, or significant stenosis is identified. These results will be called to the ordering clinician or representative by the Radiologist Assistant, and communication documented in the PACS or zVision Dashboard. Electronically Signed   By: Mitzi HansenLance  Furusawa-Stratton M.D.   On: 02/18/2017 21:54   Mr Maxine GlennMra Head/brain ZOWo Cm  Result Date: 02/18/2017 CLINICAL DATA:  66 y/o  M; slurred speech and weakness. EXAM: MRI HEAD WITHOUT CONTRAST MRA HEAD WITHOUT CONTRAST TECHNIQUE: Multiplanar, multiecho pulse sequences of the brain and surrounding structures were obtained without intravenous contrast. Angiographic images of the head were obtained using MRA technique without contrast. COMPARISON:  02/18/2017 CT of head. FINDINGS: MRI HEAD FINDINGS Brain: Subcentimeter focus of reduced diffusion within the left medial parietal lobe (series 10, image 7). Scattered nonspecific foci of T2 FLAIR hyperintense signal abnormality in subcortical and periventricular white matter compatible with mild chronic microvascular ischemic changes. Mild diffuse brain parenchymal volume loss. No abnormal susceptibility hypointensity to indicate intracranial hemorrhage. No hydrocephalus, extra-axial collection, or focal mass effect. Vascular: As below. Skull and  upper cervical spine: Normal marrow signal. Sinuses/Orbits: Negative. Other: Bilateral intra-ocular lens replacement. MRA HEAD FINDINGS Internal carotid arteries:  Patent. Anterior cerebral arteries:  Patent. Middle cerebral arteries: Patent. Anterior communicating artery: Not identified, likely hypoplastic or absent. Posterior communicating arteries: Not identified, likely hypoplastic or absent. Posterior cerebral arteries:  Patent. Basilar artery:  Patent. Vertebral arteries:  Patent. No evidence of high-grade stenosis, large vessel occlusion, or aneurysm unless noted above. IMPRESSION: 1. Subcentimeter focus of reduced diffusion within left medial parietal lobe compatible with acute/early subacute infarction. No intracranial hemorrhage. 2. Mild chronic microvascular ischemic changes and mild parenchymal volume loss of the brain. 3. Patent circle of Willis. No large vessel occlusion, aneurysm, or significant stenosis is identified. These results will be called to the ordering clinician or representative by the Radiologist Assistant, and communication documented in the PACS or zVision Dashboard. Electronically Signed   By: Mitzi HansenLance  Furusawa-Stratton M.D.   On: 02/18/2017 21:54   Ct Head Code Stroke W/o Cm  Result Date: 02/18/2017 CLINICAL DATA:  Code stroke. Slurred speech and right-sided weakness. EXAM: CT HEAD WITHOUT CONTRAST TECHNIQUE: Contiguous axial images were obtained from the base of the skull through the vertex without intravenous contrast. COMPARISON:  None FINDINGS: Brain: No evidence of acute infarction, hemorrhage, hydrocephalus, extra-axial collection or mass lesion/mass effect. Vascular: Atherosclerotic calcification.  No hyperdense vessel Skull: No acute or aggressive finding Sinuses/Orbits: Bilateral cataract resection.  No acute finding Other: These results were called by telephone at the time of interpretation on 02/18/2017 at 2:07 pm to Dr. Loren RacerAVID YELVERTON , who verbally acknowledged these  results. ASPECTS [facility]([place] Stroke Program Early CT Score) -  Ganglionic level infarction (caudate, lentiform nuclei, internal capsule, insula, M1-M3 cortex): 7 - Supraganglionic infarction (M4-M6 cortex): 3 Total score (0-10 with 10 being normal): 10 IMPRESSION: No acute finding.ASPECTS is 10. Electronically Signed   By: Marnee Spring M.D.   On: 02/18/2017 14:09    Time Spent in minutes  25   Eddie North M.D on 02/20/2017 at 11:15 AM  Between 7am to 7pm - Pager - [phone]  After 7pm go to [email or web address] - password [facility]  [facility] -  Office  [phone]

## 2017-02-21 ENCOUNTER — Inpatient Hospital Stay (HOSPITAL_COMMUNITY): Payer: BLUE CROSS/BLUE SHIELD

## 2017-02-21 ENCOUNTER — Encounter (HOSPITAL_COMMUNITY): Payer: Self-pay | Admitting: *Deleted

## 2017-02-21 ENCOUNTER — Encounter (HOSPITAL_COMMUNITY)
Admission: EM | Disposition: A | Discharge status: Home Health | Payer: Self-pay | Source: Home / Self Care | Attending: Internal Medicine

## 2017-02-21 DIAGNOSIS — I480 Paroxysmal atrial fibrillation: Secondary | ICD-10-CM

## 2017-02-21 DIAGNOSIS — E6609 Other obesity due to excess calories: Secondary | ICD-10-CM

## 2017-02-21 DIAGNOSIS — N171 Acute kidney failure with acute cortical necrosis: Secondary | ICD-10-CM

## 2017-02-21 LAB — BASIC METABOLIC PANEL
Anion gap: 9 (ref 5–15)
BUN: 46 mg/dL — AB (ref 6–20)
CALCIUM: 8.5 mg/dL — AB (ref 8.9–10.3)
CO2: 21 mmol/L — ABNORMAL LOW (ref 22–32)
CREATININE: 2.05 mg/dL — AB (ref 0.61–1.24)
Chloride: 96 mmol/L — ABNORMAL LOW (ref 101–111)
GFR calc Af Amer: 38 mL/min — ABNORMAL LOW (ref >60)
GFR, EST NON AFRICAN AMERICAN: 33 mL/min — AB (ref >60)
Glucose, Bld: 155 mg/dL — ABNORMAL HIGH (ref 65–99)
POTASSIUM: 3.6 mmol/L (ref 3.5–5.1)
SODIUM: 126 mmol/L — AB (ref 135–145)

## 2017-02-21 LAB — VAS US CAROTID
LEFT ECA DIAS: -3 cm/s
LEFT VERTEBRAL DIAS: -9 cm/s
Left CCA dist dias: -12 cm/s
Left CCA dist sys: -65 cm/s
Left CCA prox dias: 10 cm/s
Left CCA prox sys: 114 cm/s
Left ICA dist dias: -27 cm/s
Left ICA dist sys: -93 cm/s
Left ICA prox dias: -18 cm/s
Left ICA prox sys: -70 cm/s
RCCADSYS: -80 cm/s
RIGHT ECA DIAS: -21 cm/s
RIGHT VERTEBRAL DIAS: -7 cm/s
Right CCA prox dias: 14 cm/s
Right CCA prox sys: 81 cm/s

## 2017-02-21 LAB — GLUCOSE, CAPILLARY
GLUCOSE-CAPILLARY: 136 mg/dL — AB (ref 65–99)
GLUCOSE-CAPILLARY: 155 mg/dL — AB (ref 65–99)
GLUCOSE-CAPILLARY: 189 mg/dL — AB (ref 65–99)
GLUCOSE-CAPILLARY: 118 mg/dL — AB (ref 65–99)

## 2017-02-21 SURGERY — CANCELLED PROCEDURE

## 2017-02-21 MED ORDER — POTASSIUM CHLORIDE CRYS ER 20 MEQ PO TBCR: 40.0000 meq | EXTENDED_RELEASE_TABLET | Freq: Once | ORAL | Status: DC

## 2017-02-21 MED ORDER — APIXABAN 5 MG PO TABS
5.0000 mg | ORAL_TABLET | Freq: Two times a day (BID) | ORAL | Status: DC
  Administered 2017-02-21 – 2017-02-22 (×2): 5 mg via ORAL
  Filled 2017-02-21 (×2): qty 1

## 2017-02-21 NOTE — Progress Notes (Signed)
PROGRESS NOTE                                                                                                                                                                                                             Patient Demographics:    Gabriel [name], is a 66 y.o. male, DOB - April 11, 1951, VHQ:[id number]  Admit date - 02/18/2017   Admitting Physician Lahoma Crocker, MD  Outpatient Primary MD for the patient is Bradd Canary, MD  LOS - 2  Outpatient Specialists:None  Chief Complaint  Patient presents with  . Altered Mental Status       Brief Narrative   66 year old male with history of coronary artery disease, CHF, depression, diabetes mellitus type 2 with neuropathy, ongoing tobacco use, hypertension, hyperlipidemia, 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 While at the doctor's office he had sudden onset of slurred speech with right facial droop as well as right arm weakness and tongue numbness. He was sent to the ED. Since symptom had resolved by then tPA was not administered. Patient admitted for acute stroke.    Subjective:   Having Intermittent hiccups. Fever of 101F. Complained of pain in his ears which is yesterday which have now resolved. Also had infected epidermal cyst of his left back. Pus expressed at bedside and clean dressing applied. Denies any back pain today.   Assessment  & Plan :    Active Problems: Acute ischemic vs embolic stroke MRI brain showing subcentimeter focus of left medial parietal lobe acute/early subacute infarct. Risk factors include ongoing tobacco use, hypertension, hyperlipidemia, diabetes, coronary artery disease and prior stroke history. -Switched aspirin to Plavix. Carotid Doppler without significant stenosis. Doppler lower extremities negative for DVT.  - Scheduled TEE and implantable loop recorder given  concern for embolic stroke. High triglycerides. Continue gemfibrozil and added statin. A1c pending. -PT/OT. -Permissive hypertension.  -neurology consulted appreciated.  Active problems Hyponatremia Possibly secondary to hyperglycemia and diuretic use. Diuretics held. TSH normal. Continue gentle hydration.   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 Blood glucose of 470 on admission. Patient is on high-dose Lantus (240 units daily with pre-meal aspart). Placed him back on his Lantus in divided doses (120 units twice a day) and increase pre-meal aspart. CBG  improved. Follow A1c. Follows with Dr Everardo AllEllison.  Intractable hiccups Improved with when necessary chlorpromazine  Acute on CKD (chronic kidney disease) stage 3, GFR 30-59 ml/min Worsened renal function this morning. Continue gentle hydration. Continue to hold diuretic and lisinopril.  Ongoing tobacco use Counseled on cessation. Nicotine patch ordered.  Elevated troponin No chest pain symptoms or EKG changes. Troponin peaked at 0.08. Monitor on telemetry. Follow echo. Had nuclear study in 09/2013 showed normal perfusion with EF of 39%. Had cardiac cath in 2011 ([facility]) with 30% right coronary artery stenosis. Continue beta blocker, statin. Hold lisinopril.  Chronic systolic CHF Appears euvolemic. Discontinue diuretic and lisinopril due to hyponatremia and AKI.  Continue gentle hydration. Monitor I/O and daily weight.   GERD Continue PPI   Depression Continue Zoloft      Aortic stenosis Follow-up with repeat echo.  Hypertriglyceridemia Continue gemfibrozil  Fever No clear etiology. Complains of some ear pain bilaterally. Will monitor for now. Chest x-ray unremarkable.     Code Status : Full code  Family Communication  : None at bedside  Disposition Plan  : Home after workup completed  Barriers For Discharge : Pending workup  Consults  :    Stroke  Procedures  : MRI brain Head CT  DVT Prophylaxis  :  Lovenox   Lab Results  Component Value Date   PLT 211 02/18/2017    Antibiotics  :  Anti-infectives    Start     Dose/Rate Route Frequency Ordered Stop   02/20/17 1545  amoxicillin-clavulanate (AUGMENTIN) 875-125 MG per tablet 1 tablet     1 tablet Oral Every 12 hours 02/20/17 1543          Objective:   Vitals:   02/20/17 2129 02/20/17 2330 02/21/17 0200 02/21/17 0508  BP: (!) 128/54 (!) 109/47 (!) 116/50 (!) 139/59  Pulse: 86 86 86 79  Resp: 20 20 20 20   Temp: 98.6 F (37 C) 99.6 F (37.6 C) 99.6 F (37.6 C) 98.2 F (36.8 C)  TempSrc: Oral Oral Oral Oral  SpO2: 100% 100% 100% 99%    Wt Readings from Last 3 Encounters:  02/18/17 108 kg (238 lb 3.2 oz)  10/06/16 116.2 kg (256 lb 3.2 oz)  09/02/16 118.8 kg (261 lb 12.8 oz)     Intake/Output Summary (Last 24 hours) at 02/21/17 1035 Last data filed at 02/21/17 0552  Gross per 24 hour  Intake              480 ml  Output             1050 ml  Net             -570 ml     Physical Exam  Gen: not in distress HEENT: moist mucosa, supple neck Chest: Clear to auscultation bilaterally CVS: N S1&S2, no murmurs,  GI: soft, NT, ND,  Musculoskeletal: warm, no edema, Clean dressing over left back (sebaceous cyst) CNS: Nonfocal    Data Review:    CBC  Recent Labs Lab 02/18/17 1400  WBC 11.0*  HGB 13.7  HCT 37.9*  PLT 211  MCV 88.3  MCH 31.9  MCHC 36.1*  RDW 12.9  LYMPHSABS 1.0  MONOABS 0.8  EOSABS 0.1  BASOSABS 0.0    Chemistries   Recent Labs Lab 02/18/17 1400 02/18/17 2204 02/19/17 0920 02/20/17 0427 02/21/17 0415  NA 123* 124* 126* 128* 126*  K 4.7 4.1 4.4 4.2 3.6  CL 86* 91* 91* 93* 96*  CO2 25 21* 20* 24 21*  GLUCOSE 445* 291* 347* 176* 155*  BUN 31* 32* 29* 38* 46*  CREATININE 1.86* 1.60* 1.48* 1.87* 2.05*  CALCIUM 9.3 8.8* 9.3 9.2 8.5*  AST 14*  --   --   --   --   ALT 13*  --   --   --   --   ALKPHOS 92  --   --    --   --   BILITOT 0.6  --   --   --   --    ------------------------------------------------------------------------------------------------------------------  Recent Labs  02/19/17 0415  CHOL 168  HDL 19*  LDLCALC UNABLE TO CALCULATE IF TRIGLYCERIDE OVER 400 mg/dL  TRIG 161*  CHOLHDL 8.8    Lab Results  Component Value Date   HGBA1C 8.1 07/12/2016   ------------------------------------------------------------------------------------------------------------------  Recent Labs  02/18/17 2204  TSH 0.671   ------------------------------------------------------------------------------------------------------------------ No results for input(s): VITAMINB12, FOLATE, FERRITIN, TIBC, IRON, RETICCTPCT in the last 72 hours.  Coagulation profile  Recent Labs Lab 02/18/17 1400 02/18/17 1706  INR 1.07 1.15    No results for input(s): DDIMER in the last 72 hours.  Cardiac Enzymes  Recent Labs Lab 02/18/17 1706 02/18/17 2204 02/19/17 0415  TROPONINI 0.07* 0.06* 0.06*   ------------------------------------------------------------------------------------------------------------------ No results found for: BNP  Inpatient Medications  Scheduled Meds: .  stroke: mapping our early stages of recovery book   Does not apply Once  . amLODipine  10 mg Oral Daily  . amoxicillin-clavulanate  1 tablet Oral Q12H  . atorvastatin  40 mg Oral q1800  . carvedilol  25 mg Oral BID WC  . chlorproMAZINE  25 mg Oral TID  . clopidogrel  75 mg Oral Daily  . gemfibrozil  600 mg Oral BID AC  . insulin aspart  0-20 Units Subcutaneous TID WC  . insulin aspart  0-5 Units Subcutaneous QHS  . insulin aspart  6 Units Subcutaneous TID WC  . insulin glargine  120 Units Subcutaneous BID  . nicotine  14 mg Transdermal Daily  . pantoprazole  40 mg Oral Daily  . potassium chloride  40 mEq Oral Once  . sertraline  150 mg Oral Daily   Continuous Infusions: . sodium chloride 75 mL/hr at 02/20/17  1213   PRN Meds:.acetaminophen **OR** acetaminophen (TYLENOL) oral liquid 160 mg/5 mL **OR** acetaminophen, albuterol, fluticasone, hydrALAZINE, HYDROcodone-acetaminophen  Micro Results Recent Results (from the past 240 hour(s))  MRSA PCR Screening     Status: None   Collection Time: 02/18/17  6:24 PM  Result Value Ref Range Status   MRSA by PCR NEGATIVE NEGATIVE Final    Comment:        The GeneXpert MRSA Assay (FDA approved for NASAL specimens only), is one component of a comprehensive MRSA colonization surveillance program. It is not intended to diagnose MRSA infection nor to guide or monitor treatment for MRSA infections.     Radiology Reports Dg Chest 2 View  Result Date: 02/19/2017 CLINICAL DATA:  Acute wheezing EXAM: CHEST  2 VIEW COMPARISON:  05/29/2015 FINDINGS: The heart size and mediastinal contours are within normal limits. Both lungs are clear. The visualized skeletal structures are unremarkable. Lower cervical fusion hardware partially imaged. Very minor thoracic aortic atherosclerosis present. Trachea is midline. No pleural effusion or pneumothorax. IMPRESSION: No active cardiopulmonary disease. Electronically Signed   By: Judie Petit.  Shick M.D.   On: 02/19/2017 10:11   Mr Brain Wo Contrast  Result Date: 02/18/2017 CLINICAL DATA:  66 y/o  M;  slurred speech and weakness. EXAM: MRI HEAD WITHOUT CONTRAST MRA HEAD WITHOUT CONTRAST TECHNIQUE: Multiplanar, multiecho pulse sequences of the brain and surrounding structures were obtained without intravenous contrast. Angiographic images of the head were obtained using MRA technique without contrast. COMPARISON:  02/18/2017 CT of head. FINDINGS: MRI HEAD FINDINGS Brain: Subcentimeter focus of reduced diffusion within the left medial parietal lobe (series 10, image 7). Scattered nonspecific foci of T2 FLAIR hyperintense signal abnormality in subcortical and periventricular white matter compatible with mild chronic microvascular ischemic  changes. Mild diffuse brain parenchymal volume loss. No abnormal susceptibility hypointensity to indicate intracranial hemorrhage. No hydrocephalus, extra-axial collection, or focal mass effect. Vascular: As below. Skull and upper cervical spine: Normal marrow signal. Sinuses/Orbits: Negative. Other: Bilateral intra-ocular lens replacement. MRA HEAD FINDINGS Internal carotid arteries:  Patent. Anterior cerebral arteries:  Patent. Middle cerebral arteries: Patent. Anterior communicating artery: Not identified, likely hypoplastic or absent. Posterior communicating arteries: Not identified, likely hypoplastic or absent. Posterior cerebral arteries:  Patent. Basilar artery:  Patent. Vertebral arteries:  Patent. No evidence of high-grade stenosis, large vessel occlusion, or aneurysm unless noted above. IMPRESSION: 1. Subcentimeter focus of reduced diffusion within left medial parietal lobe compatible with acute/early subacute infarction. No intracranial hemorrhage. 2. Mild chronic microvascular ischemic changes and mild parenchymal volume loss of the brain. 3. Patent circle of Willis. No large vessel occlusion, aneurysm, or significant stenosis is identified. These results will be called to the ordering clinician or representative by the Radiologist Assistant, and communication documented in the PACS or zVision Dashboard. Electronically Signed   By: Mitzi Hansen M.D.   On: 02/18/2017 21:54   [name] Head/brain ZO Cm  Result Date: 02/18/2017 CLINICAL DATA:  66 y/o  M; slurred speech and weakness. EXAM: MRI HEAD WITHOUT CONTRAST MRA HEAD WITHOUT CONTRAST TECHNIQUE: Multiplanar, multiecho pulse sequences of the brain and surrounding structures were obtained without intravenous contrast. Angiographic images of the head were obtained using MRA technique without contrast. COMPARISON:  02/18/2017 CT of head. FINDINGS: MRI HEAD FINDINGS Brain: Subcentimeter focus of reduced diffusion within the left medial parietal  lobe (series 10, image 7). Scattered nonspecific foci of T2 FLAIR hyperintense signal abnormality in subcortical and periventricular white matter compatible with mild chronic microvascular ischemic changes. Mild diffuse brain parenchymal volume loss. No abnormal susceptibility hypointensity to indicate intracranial hemorrhage. No hydrocephalus, extra-axial collection, or focal mass effect. Vascular: As below. Skull and upper cervical spine: Normal marrow signal. Sinuses/Orbits: Negative. Other: Bilateral intra-ocular lens replacement. MRA HEAD FINDINGS Internal carotid arteries:  Patent. Anterior cerebral arteries:  Patent. Middle cerebral arteries: Patent. Anterior communicating artery: Not identified, likely hypoplastic or absent. Posterior communicating arteries: Not identified, likely hypoplastic or absent. Posterior cerebral arteries:  Patent. Basilar artery:  Patent. Vertebral arteries:  Patent. No evidence of high-grade stenosis, large vessel occlusion, or aneurysm unless noted above. IMPRESSION: 1. Subcentimeter focus of reduced diffusion within left medial parietal lobe compatible with acute/early subacute infarction. No intracranial hemorrhage. 2. Mild chronic microvascular ischemic changes and mild parenchymal volume loss of the brain. 3. Patent circle of Willis. No large vessel occlusion, aneurysm, or significant stenosis is identified. These results will be called to the ordering clinician or representative by the Radiologist Assistant, and communication documented in the PACS or zVision Dashboard. Electronically Signed   By: Mitzi Hansen M.D.   On: 02/18/2017 21:54   Ct Head Code Stroke W/o Cm  Result Date: 02/18/2017 CLINICAL DATA:  Code stroke. Slurred speech and right-sided weakness. EXAM: CT HEAD WITHOUT  CONTRAST TECHNIQUE: Contiguous axial images were obtained from the base of the skull through the vertex without intravenous contrast. COMPARISON:  None FINDINGS: Brain: No evidence  of acute infarction, hemorrhage, hydrocephalus, extra-axial collection or mass lesion/mass effect. Vascular: Atherosclerotic calcification.  No hyperdense vessel Skull: No acute or aggressive finding Sinuses/Orbits: Bilateral cataract resection.  No acute finding Other: These results were called by telephone at the time of interpretation on 02/18/2017 at 2:07 pm to Dr. Loren Racer , who verbally acknowledged these results. ASPECTS [facility] Stroke Program Early CT Score) - Ganglionic level infarction (caudate, lentiform nuclei, internal capsule, insula, M1-M3 cortex): 7 - Supraganglionic infarction (M4-M6 cortex): 3 Total score (0-10 with 10 being normal): 10 IMPRESSION: No acute finding.ASPECTS is 10. Electronically Signed   By: Marnee Spring M.D.   On: 02/18/2017 14:09    Time Spent in minutes  25   Eddie North M.D on 02/21/2017 at 10:35 AM  Between 7am to 7pm - Pager - [phone]  After 7pm go to [email or web address] - password [facility]  [facility] -  Office  [phone]

## 2017-02-21 NOTE — Progress Notes (Signed)
02/20/17 2330  What Happened  Was fall witnessed? No  Was patient injured? No  Patient found on floor  Found by Staff-comment (Iv was beating so CNA went to check on him and was found sit)  Stated prior activity bathroom-unassisted  Follow Up  MD notified Dr. Ernestine Mcmurraytiz  Time MD notified 0020  Family notified (will be notified in the morning)  Adult Fall Risk Assessment  Risk Factor Category (scoring not indicated) High fall risk per protocol (document High fall risk);Fall has occurred during this admission (document High fall risk)  Age 66  Fall History: Fall within 6 months prior to admission 5  Elimination; Bowel and/or Urine Incontinence 0  Elimination; Bowel and/or Urine Urgency/Frequency 0  Medications: includes PCA/Opiates, Anti-convulsants, Anti-hypertensives, Diuretics, Hypnotics, Laxatives, Sedatives, and Psychotropics 0  Patient Care Equipment 2  Mobility-Assistance 2  Mobility-Gait 0  Mobility-Sensory Deficit 0  Altered awareness of immediate physical environment 0  Impulsiveness 0  Lack of understanding of one's physical/cognitive limitations 0  Total Score 10  Patient's Fall Risk Moderate Fall Risk (6-13 points)  Adult Fall Risk Interventions  Required Bundle Interventions *See Row Information* Moderate fall risk - low and moderate requirements implemented  Vitals  Temp 99.6 F (37.6 C) (Reported to RN)  Temp Source Oral  BP (!) 109/47  BP Location Left Arm  BP Method Automatic  Patient Position (if appropriate) Lying  Pulse Rate 86  Pulse Rate Source Dinamap  Resp 20  Oxygen Therapy  SpO2 100 %  O2 Device Room Air  Pain Assessment  Pain Assessment No/denies pain  Pain Score 0  Neurological  Neuro (WDL) WDL  Level of Consciousness Alert  Orientation Level Oriented X4  Cognition Appropriate at baseline  Speech Clear  Pupil Assessment  Yes  R Pupil Size (mm) 3  R Pupil Shape Round  R Pupil Reaction Brisk  L Pupil Size (mm) 3  L Pupil Shape Round  L  Pupil Reaction Brisk  Additional Pupil Assessments No  Facial Symmetry Symmetrical  R Elbow Extension (Push/Biceps) Present;Strong  RUE Motor Response Purposeful movement  RUE Sensation Full sensation  RUE Motor Strength 5  LUE Motor Response Purposeful movement  LUE Sensation Full sensation  LUE Motor Strength 5  RLE Motor Response Purposeful movement  RLE Sensation Full sensation  RLE Motor Strength 5  LLE Motor Response Purposeful movement  LLE Sensation Full sensation  LLE Motor Strength 5  Neuro Additional Assessments No  Musculoskeletal  Musculoskeletal (WDL) WDL  Assistive Device None  Generalized Weakness Yes  Integumentary  Integumentary (WDL) X  Skin Color Appropriate for ethnicity  Skin Condition Dry  Skin Integrity Intact;Abrasion

## 2017-02-21 NOTE — Progress Notes (Signed)
STROKE TEAM PROGRESS NOTE   SUBJECTIVE (INTERVAL HISTORY) His RN is at the bedside.  Found to have afib prior to TEE and loop, hence they were cancelled. No complains over night.    OBJECTIVE Temp:  [98.2 F (36.8 C)-99.6 F (37.6 C)] 98.9 F (37.2 C) (05/07 1331) Pulse Rate:  [79-96] 96 (05/07 1331) Cardiac Rhythm: Normal sinus rhythm (05/07 0700) Resp:  [18-20] 18 (05/07 1331) BP: (94-139)/(47-59) 124/47 (05/07 1331) SpO2:  [96 %-100 %] 96 % (05/07 1331)  CBC:   Recent Labs Lab 02/18/17 1400  WBC 11.0*  NEUTROABS 9.1*  HGB 13.7  HCT 37.9*  MCV 88.3  PLT 211    Basic Metabolic Panel:   Recent Labs Lab 02/20/17 0427 02/21/17 0415  NA 128* 126*  K 4.2 3.6  CL 93* 96*  CO2 24 21*  GLUCOSE 176* 155*  BUN 38* 46*  CREATININE 1.87* 2.05*  CALCIUM 9.2 8.5*    Lipid Panel:     Component Value Date/Time   CHOL 168 02/19/2017 0415   TRIG 427 (H) 02/19/2017 0415   HDL 19 (L) 02/19/2017 0415   CHOLHDL 8.8 02/19/2017 0415   VLDL UNABLE TO CALCULATE IF TRIGLYCERIDE OVER 400 mg/dL 16/10/960405/02/2017 54090415   LDLCALC UNABLE TO CALCULATE IF TRIGLYCERIDE OVER 400 mg/dL 81/19/147805/02/2017 29560415   [id number]:  Lab Results  Component Value Date   HGBA1C 8.1 07/12/2016   Urine Drug Screen:     Component Value Date/Time   LABOPIA NONE DETECTED 02/18/2017 1933   COCAINSCRNUR NONE DETECTED 02/18/2017 1933   LABBENZ NONE DETECTED 02/18/2017 1933   AMPHETMU NONE DETECTED 02/18/2017 1933   THCU NONE DETECTED 02/18/2017 1933   LABBARB NONE DETECTED 02/18/2017 1933    Alcohol Level No results found for: ETH  IMAGING I have personally reviewed the radiological images below and agree with the radiology interpretations.  Gabriel [name] 1. Subcentimeter focus of reduced diffusion within left medial parietal lobe compatible with acute/early subacute infarction. No intracranial hemorrhage.  2. Mild chronic microvascular ischemic changes and mild parenchymal volume loss of the  brain.  3. Patent circle of Willis. No large vessel occlusion, aneurysm, or significant stenosis is identified.   Ct Head Code Stroke W/o Cm [name] No acute finding. ASPECTS is 10.   TTE pending  CUS - Preliminary report:  1-39% ICA stenosis.  Vertebral artery flow is antegrade.   LE venous doppler - negative for deep vein thrombosis  EKG - afib   PHYSICAL EXAM  Temp:  [98.2 F (36.8 C)-99.6 F (37.6 C)] 98.9 F (37.2 C) (05/07 1331) Pulse Rate:  [79-96] 96 (05/07 1331) Resp:  [18-20] 18 (05/07 1331) BP: (94-139)/(47-59) 124/47 (05/07 1331) SpO2:  [96 %-100 %] 96 % (05/07 1331)  General - Well nourished, well developed, in no apparent distress.  Ophthalmologic - Sharp disc margins OU.   Cardiovascular - Regular rate and rhythm.  Mental Status -  Level of arousal and orientation to time, place, and person were intact. Language including expression, naming, repetition, comprehension was assessed and found intact. Fund of Knowledge was assessed and was intact.  Cranial Nerves II - XII - II - Visual field intact OU. III, IV, VI - Extraocular movements intact. V - Facial sensation intact bilaterally. VII - Facial movement intact bilaterally. VIII - Hearing & vestibular intact bilaterally. X - Palate elevates symmetrically. XI - Chin turning & shoulder shrug intact bilaterally. XII - Tongue protrusion intact.  Motor Strength - The patient's strength  was normal in all extremities and pronator drift was absent.  Bulk was normal and fasciculations were absent.   Motor Tone - Muscle tone was assessed at the neck and appendages and was normal.  Reflexes - The patient's reflexes were 1+ in all extremities and he had no pathological reflexes.  Sensory - Light touch, temperature/pinprick were assessed and were symmetrical.    Coordination - The patient had normal movements in the hands and feet with no ataxia or dysmetria.  Tremor was absent.  Gait and Station -  deferred    ASSESSMENT/PLAN Gabriel [name] is a 66 y.o. male with history of a previous stroke, tobacco use, hypertension, hyperlipidemia, diabetes mellitus, congestive heart failure, and aortic stenosis presenting with inability to speak with right sided weakness. He did not receive IV t-PA due to resolution of deficits.  Stroke:  punctate Lt medial parietal infarct in the left ACA territory, which can not explain pt symptoms. Pt aphasia and right sided weakness concerning for right MCA involvement. Found to have afib during admission  Resultant - resolution of deficits  MRI - Subcentimeter focus of reduced diffusion within left medial parietal lobe   MRA - No large vessel occlusion, aneurysm, or significant stenosis is identified.   Carotid Doppler - 1-39% ICA stenosis.  Vertebral artery flow is antegrade.   2D Echo - pending   LE venous doppler - negative for DVT  EKG showed afib.   LDL - unable to calculate secondary to triglycerides greater than 400  HgbA1c - pending  UDS negative  VTE prophylaxis - SCDs Diet Carb Modified Fluid consistency: Thin; Room service appropriate? Yes; Fluid restriction: 1500 mL Fluid  aspirin 81 mg daily prior to admission, now on clopidogrel 75 mg daily. Due to new diagnosis of Afib in the setting of stroke, we recommend anticoagulation with DOACs for stroke prevention. Prefer eliquis 5mg  bid.   Patient counseled to be compliant with his antithrombotic medications  Ongoing aggressive stroke risk factor management  Therapy recommendations: pending  Disposition: Pending  Paroxysmal afib  New diagnosis  Found on EKG prior to TEE and loop  With recent stroke, recommend DOAC for stroke prevention  Will add eliquis 5mg  bid. Will stop plavix.   Hypertension  Stable BP goal normotensive  Hyperlipidemia  Home meds: Lipitor 40 mg daily resumed in hospital   On Lopid for triglycerides  LDL unable to calculate due to high TG,  goal < 70  Continue lipitor and lopid at discharge  Diabetes  HgbA1c pending, goal < 7.0  Uncontrolled  CBG high  On lantus  SSI  Tobacco abuse  Current smoker  Smoking cessation counseling provided  Nicotine patch provided  Pt is willing to quit  Other Stroke Risk Factors  Advanced age  Hx stroke/TIA in 81 with right side deficit but resolved and no residue PTA  Other Active Problems  Mild leukocytosis - 11.0 - afebrile  Hyponatremia - 126  AKI on CKD - creatinine - 1.6 -> 1.48 -> 1.87->2.05  Hospital day # 2   Neurology will sign off. Please call with questions. Pt will follow up with Darrol Angel NP at [facility] in about 6 weeks. Thanks for the consult.   Marvel Plan, MD PhD Stroke Neurology 02/21/2017 2:30 PM   To contact Stroke Continuity provider, please refer to [email or web address]. After hours, contact General Neurology

## 2017-02-21 NOTE — Progress Notes (Signed)
Pt gone for procedure. Will check back as schedule allows Lise AuerLori Chaska [place], [place] [phone]

## 2017-02-21 NOTE — Progress Notes (Signed)
ANTICOAGULATION CONSULT NOTE - Initial Consult  Pharmacy Consult for Apixaban Indication: atrial fibrillation  No Known Allergies   Vital Signs: Temp: 98.9 F (37.2 C) (05/07 1331) Temp Source: Oral (05/07 1331) BP: 124/47 (05/07 1331) Pulse Rate: 96 (05/07 1331)  Labs:  Recent Labs  02/18/17 1706  02/18/17 2204 02/19/17 0415 02/19/17 0920 02/20/17 0427 02/21/17 0415  APTT 47*  --   --   --   --   --   --   LABPROT 14.8  --   --   --   --   --   --   INR 1.15  --   --   --   --   --   --   CREATININE  --   < > 1.60*  --  1.48* 1.87* 2.05*  TROPONINI 0.07*  --  0.06* 0.06*  --   --   --   < > = values in this interval not displayed.  Estimated Creatinine Clearance: 47.6 mL/min (A) (by C-G formula based on SCr of 2.05 mg/dL (H)).   Medical History: Past Medical History:  Diagnosis Date  . Abdominal bloating 09/12/2015  . Allergic state 02/08/2013  . Aortic stenosis   . Arthritis   . Congestive heart failure (HCC)    September 2011-CHF  . Depression   . Diabetes mellitus type 2 in obese (HCC) 04/16/2016  . History of chicken pox   . History of chronic bronchitis    dx 2011  . History of stroke 1992   blood clot at base of brain-[facility]  . Hyperlipidemia   . Hypertension   . Seasonal allergies   . Stroke (HCC)   . Tobacco abuse 11/27/2012   Down from 3 ppd to 1/2 ppd  . Unspecified hereditary and idiopathic peripheral neuropathy 05/26/2014   Tingling in toes    Assessment: 66 year old male to begin Eliquis for PAF in setting of new CVA  Goal of Therapy:  Appropriate dosing   Plan:  Eliquis 5 mg po BID Pharmacy to follow peripherally  Thank you Okey RegalLisa Letishia Elliott, PharmD [phone]  02/21/2017,2:48 PM

## 2017-02-21 NOTE — Care Management Note (Signed)
Case Management Note  Patient Details  Name: Gabriel SalvageRaymon Hinderman MRN: [id number] Date of Birth: 04-11-1951  Subjective/Objective:   Pt admitted with CVA. He lis from home alone.                Action/Plan: PT/OT recommending HH services. CM following for d/c needs, physician orders.  Expected Discharge Date:                  Expected Discharge Plan:  Home w Home Health Services  In-House Referral:     Discharge planning Services     Post Acute Care Choice:    Choice offered to:     DME Arranged:    DME Agency:     HH Arranged:    HH Agency:     Status of Service:  In process, will continue to follow  If discussed at Long Length of Stay Meetings, dates discussed:    Additional Comments:  Kermit BaloKelli F Philippa Vessey, RN 02/21/2017, 4:29 PM

## 2017-02-21 NOTE — Evaluation (Signed)
Occupational Therapy Evaluation Patient Details Name: Gabriel [name] MRN: [id number] DOB: 1950-11-15 Today's Date: 02/21/2017    History of Present Illness Pt is a 66 y/o male admitted from his doctors office secondary to onset of R arm weakness, facial droop, and slurred speech. CT scan was negative, however, MRI showed acute/subacute infarct in L parietal lobe. Venous dopplers also negative. PMH includes CAD, CHF, depression, DM, tobacco abuse, HTN, CVA with no residual deficits, and s/p R TKA and Lumbar fusion.    Clinical Impression   Pt admitted with R arm weakness ( now resovlved)  Pt currently with functional limitations due to the deficits listed below (see OT Problem List).  Pt will benefit from skilled OT to increase their safety and independence with ADL and functional mobility for ADL to facilitate discharge to venue listed below.               Precautions / Restrictions Precautions Precautions: Fall Precaution Comments: Had fall last night on unit. NT reported he had accident on floor ans slipped in it.       Mobility Bed Mobility               General bed mobility comments: pt sitting EOB  Transfers Overall transfer level: Needs assistance Equipment used: Rolling walker (2 wheeled) Transfers: Sit to/from [employer] Sit to Stand: Min guard Stand pivot transfers: Min guard       General transfer comment: Min guard for safety. Verbal cues for appropriate hand placement.         ADL either performed or assessed with clinical judgement   ADL Overall ADL's : Needs assistance/impaired Eating/Feeding: Set up;Sitting   Grooming: Standing;Min guard   Upper Body Bathing: Set up;Sitting   Lower Body Bathing: Min guard;Sit to/from stand;Cueing for sequencing;Cueing for safety   Upper Body Dressing : Set up;Sitting   Lower Body Dressing: Min guard;Sit to/from stand;Cueing for safety;Cueing for sequencing   Toilet Transfer: Min  guard;RW;Ambulation;Cueing for sequencing   Toileting- Clothing Manipulation and Hygiene: Supervision/safety;Sit to/from stand;Cueing for safety;Cueing for sequencing                   Hand Dominance Right   Extremity/Trunk Assessment Upper Extremity Assessment Upper Extremity Assessment: Generalized weakness           Communication Communication Communication: No difficulties   Cognition Arousal/Alertness: Awake/alert Behavior During Therapy: WFL for tasks assessed/performed Overall Cognitive Status: Within Functional Limits for tasks assessed                                     General Comments   educated on safety with walker, calling for nurse when he gets up, etc            Home Living Family/patient expects to be discharged to:: Private residence (lives in retirement community) Living Arrangements: Alone Available Help at Discharge: Friend(s);Family;Available 24 hours/day Type of Home: Apartment Home Access: Other (comment) (threshold step )     Home Layout: One level     Bathroom Shower/Tub: Chief Strategy Officer: Standard     Home Equipment: Environmental consultant - 2 wheels;Cane - single point;Grab bars - tub/shower;Grab bars - toilet   Additional Comments: Pt lives in retirement community and reports neighbors and family are available to assist 24/7 throughout the day.       Prior Functioning/Environment Level of Independence: Needs assistance  Gait /  Transfers Assistance Needed: Independent with ambulation.  ADL's / Homemaking Assistance Needed: Neighbors helped to clean the house            OT Problem List: Decreased strength;Decreased activity tolerance;Impaired balance (sitting and/or standing);Decreased safety awareness      OT Treatment/Interventions: Self-care/ADL training;DME and/or AE instruction;Patient/family education    OT Goals(Current goals can be found in the care plan section) Acute Rehab OT  Goals Patient Stated Goal: to go home  OT Goal Formulation: With patient Time For Goal Achievement: 03/07/17  OT Frequency: Min 2X/week              AM-PAC PT "6 Clicks" Daily Activity     Outcome Measure Help from another person eating meals?: None Help from another person taking care of personal grooming?: None Help from another person toileting, which includes using toliet, bedpan, or urinal?: A Little Help from another person bathing (including washing, rinsing, drying)?: A Little Help from another person to put on and taking off regular upper body clothing?: None Help from another person to put on and taking off regular lower body clothing?: A Little 6 Click Score: 21   End of Session Equipment Utilized During Treatment: Rolling walker Nurse Communication: Mobility status  Activity Tolerance: Patient tolerated treatment well Patient left:    OT Visit Diagnosis: Unsteadiness on feet (R26.81)                Time: 1455-1520 OT Time Calculation (min): 25 min Charges:  OT General Charges $OT Visit: 1 Procedure OT Evaluation $OT Eval Moderate Complexity: 1 Procedure OT Treatments $Self Care/Home Management : 8-22 mins G-Codes:     Gabriel [name], OT [phone]  Einar CrowEDDING, Mahagony Grieb D 02/21/2017, 3:28 PM

## 2017-02-21 NOTE — Evaluation (Signed)
Speech Language Pathology Evaluation Patient Details Name: Gabriel [name] MRN: [id number] DOB: 24-Jun-1951 Today's Date: 02/21/2017 Time: 1478-2956 SLP Time Calculation (min) (ACUTE ONLY): 17 min  Problem List:  Patient Active Problem List   Diagnosis Date Noted  . Hyperglycemia   . Acute CVA (cerebrovascular accident) (HCC) 02/19/2017  . TIA (transient ischemic attack) 02/18/2017  . Stroke-like symptoms 02/18/2017  . Hyponatremia 02/18/2017  . Elevated troponin 02/18/2017  . Elevated partial thromboplastin time (PTT) 02/18/2017  . CKD (chronic kidney disease) stage 3, GFR 30-59 ml/min 02/18/2017  . Acute upper respiratory infection 09/02/2016  . Diabetes mellitus type 2 in obese (HCC) 04/16/2016  . Abdominal bloating 09/12/2015  . Neck pain 03/08/2015  . Skin lesion 03/08/2015  . Preventative health care 03/08/2015  . Back pain with left-sided radiculopathy 02/03/2015  . Hereditary and idiopathic peripheral neuropathy 05/26/2014  . Aortic stenosis 09/19/2013  . Hyperlipidemia 09/19/2013  . Acute low back pain with radicular symptoms, duration less than 6 weeks 05/16/2013  . Allergic state 02/08/2013  . Obesity 11/27/2012  . Tobacco abuse 11/27/2012  . Palpitations 11/08/2012  . Abnormal EKG 11/08/2012  . ED (erectile dysfunction) 03/17/2012  . HTN (hypertension) 09/01/2011   Past Medical History:  Past Medical History:  Diagnosis Date  . Abdominal bloating 09/12/2015  . Allergic state 02/08/2013  . Aortic stenosis   . Arthritis   . Congestive heart failure (HCC)    September 2011-CHF  . Depression   . Diabetes mellitus type 2 in obese (HCC) 04/16/2016  . History of chicken pox   . History of chronic bronchitis    dx 2011  . History of stroke 1992   blood clot at base of brain-[facility]  . Hyperlipidemia   . Hypertension   . Seasonal allergies   . Stroke (HCC)   . Tobacco abuse 11/27/2012   Down from 3 ppd to 1/2 ppd  . Unspecified hereditary and  idiopathic peripheral neuropathy 05/26/2014   Tingling in toes   Past Surgical History:  Past Surgical History:  Procedure Laterality Date  . KNEE SURGERY  1976, 1989   right knee torn ligament repair  . LUMBAR FUSION  1982   L4-L5  . REVISION TOTAL KNEE ARTHROPLASTY  2007   right knee replacement   HPI:  Ptis a 66 y.o.malewith history of a previous stroke, tobacco use, hypertension, hyperlipidemia, diabetes mellitus, congestive heart failure, and aortic stenosispresenting with inability to speak with right sidedweakness.Hedidnot receive IV t-PA due to resolution of deficits. MRI showed subcentimeter focus of reduced diffusion within the left medial parietal lobe.   Assessment / Plan / Recommendation Clinical Impression  Pt appears to be at his cognitive-linguistic baseline and is without subjective complaints. He demonstrated divided attention and adequate recall of daily events as well as new information. No acute SLP f/u indicated, will sign off.    SLP Assessment  SLP Recommendation/Assessment: Patient does not need any further Speech Lanaguage Pathology Services SLP Visit Diagnosis: Cognitive communication deficit (R41.841)    Follow Up Recommendations  None    Frequency and Duration           SLP Evaluation Cognition  Overall Cognitive Status: Within Functional Limits for tasks assessed Orientation Level: Oriented X4       Comprehension  Auditory Comprehension Overall Auditory Comprehension: Appears within functional limits for tasks assessed Reading Comprehension Reading Status: Within funtional limits    Expression Expression Primary Mode of Expression: Verbal Verbal Expression Overall Verbal Expression: Appears within functional  limits for tasks assessed   Oral / Motor  Motor Speech Overall Motor Speech: Appears within functional limits for tasks assessed   GO                    Maxcine Hamaiewonsky, Kaylina Cahue 02/21/2017, 10:38 AM  Maxcine HamLaura Paiewonsky, M.A.  CCC-SLP [phone]

## 2017-02-21 NOTE — Evaluation (Signed)
Physical Therapy Evaluation Patient Details Name: Gabriel [name] MRN: [id number] DOB: [birth year] Today's Date: 02/21/2017   History of Present Illness  Pt is a 66 y/o male admitted from his doctors office secondary to onset of R arm weakness, facial droop, and slurred speech. CT scan was negative, however, MRI showed acute/subacute infarct in L parietal lobe. Venous dopplers also negative. PMH includes CAD, CHF, depression, DM, tobacco abuse, HTN, CVA with no residual deficits, and s/p R TKA and Lumbar fusion.   Clinical Impression  Pt admitted for problem above with deficits below. PTA, pt was independent with functional mobility, and lived in retirement community. Pt reports his neighbors helped with cleaning his house. Upon evaluation, pt presenting with decreased steadiness and strength and required min guard assist for safety throughout mobility. Educated about use of RW upon return home to increase safety. Recommending d/c recommendations below. Reports his family and neighbors can help provide appropriate assist. Will continue to follow to maximize independence and safety with functional mobility.     Follow Up Recommendations Home health PT;Supervision/Assistance - 24 hour    Equipment Recommendations  3in1 (PT)    Recommendations for Other Services       Precautions / Restrictions Precautions Precautions: Fall Precaution Comments: Had fall last night on unit. NT reported he had accident on floor ans slipped in it.  Restrictions Weight Bearing Restrictions: No      Mobility  Bed Mobility Overal bed mobility: Needs Assistance Bed Mobility: Supine to Sit     Supine to sit: Min guard;HOB elevated     General bed mobility comments: Min guard for safety. Elevated HOB required for trunk elevation.   Transfers Overall transfer level: Needs assistance Equipment used: Rolling walker (2 wheeled) Transfers: Sit to/from Stand Sit to Stand: Min guard         General transfer  comment: Min guard for safety. Verbal cues for appropriate hand placement.   Ambulation/Gait Ambulation/Gait assistance: Min guard;Supervision Ambulation Distance (Feet): 200 Feet Assistive device: Rolling walker (2 wheeled) Gait Pattern/deviations: Step-through pattern;Decreased stride length;Decreased weight shift to right;Wide base of support;Trunk flexed Gait velocity: Decreased Gait velocity interpretation: Below normal speed for age/gender General Gait Details: Pt with slow, overall steady gait with use of RW. Mild LOB with horizontal and vertical head turns, however, pt able to self correct. Pt with slight trendelenberg on the R. Verbal cues for upright posture and appropriate proximity to device.   Stairs Stairs: Yes Stairs assistance: Min guard;Supervision Stair Management: Two rails;Step to pattern;Forwards Number of Stairs: 2 General stair comments: Practiced for threshold step at home. Educated about how to use RW for stair manaement at home, however, practiced with 2 rails. Pt demonstrating safe technique with stair management.   Wheelchair Mobility    Modified Rankin (Stroke Patients Only) Modified Rankin (Stroke Patients Only) Pre-Morbid Rankin Score: No symptoms Modified Rankin: Moderately severe disability     Balance Overall balance assessment: Needs assistance Sitting-balance support: No upper extremity supported;Feet supported Sitting balance-Leahy Scale: Good     Standing balance support: Bilateral upper extremity supported;During functional activity Standing balance-Leahy Scale: Poor Standing balance comment: Reliant on RW for stability                  Standardized Balance Assessment Standardized Balance Assessment : Dynamic Gait Index   Dynamic Gait Index Level Surface: Mild Impairment Gait with Horizontal Head Turns: Moderate Impairment Gait with Vertical Head Turns: Moderate Impairment       Pertinent Vitals/Pain Pain Assessment:  No/denies pain    Home Living Family/patient expects to be discharged to:: Private residence (lives in retirement community) Living Arrangements: Alone Available Help at Discharge: Friend(s);Family;Available 24 hours/day Type of Home: Apartment Home Access: Other (comment) (threshold step )     Home Layout: One level Home Equipment: Walker - 2 wheels;Cane - single point;Grab bars - tub/shower;Grab bars - toilet Additional Comments: Pt lives in retirement community and reports neighbors and family are available to assist 24/7 throughout the day.     Prior Function Level of Independence: Needs assistance   Gait / Transfers Assistance Needed: Independent with ambulation.   ADL's / Homemaking Assistance Needed: Neighbors helped to clean the house        Hand Dominance   Dominant Hand: Right    Extremity/Trunk Assessment   Upper Extremity Assessment Upper Extremity Assessment: Defer to OT evaluation    Lower Extremity Assessment Lower Extremity Assessment: RLE deficits/detail RLE Deficits / Details: Sensory in tact. Grossly equivalent to LLE at 4/5 strength throughout.      Cervical / Trunk Assessment Cervical / Trunk Assessment: Kyphotic  Communication   Communication: No difficulties  Cognition Arousal/Alertness: Awake/alert Behavior During Therapy: WFL for tasks assessed/performed Overall Cognitive Status: Within Functional Limits for tasks assessed                                        General Comments General comments (skin integrity, edema, etc.): Educated about use of 3in1 as shower chair at home and recommendations for HHPT.     Exercises     Assessment/Plan    PT Assessment Patient needs continued PT services  PT Problem List Decreased strength;Decreased balance;Decreased mobility;Decreased knowledge of use of DME;Decreased knowledge of precautions       PT Treatment Interventions DME instruction;Gait training;Stair  training;Therapeutic activities;Functional mobility training;Therapeutic exercise;Neuromuscular re-education;Balance training;Patient/family education    PT Goals (Current goals can be found in the Care Plan section)  Acute Rehab PT Goals Patient Stated Goal: to go home  PT Goal Formulation: With patient Time For Goal Achievement: 02/28/17 Potential to Achieve Goals: Good    Frequency Min 4X/week   Barriers to discharge Decreased caregiver support Lives alone, however, reports family and neighbors will be able to assist as necessary.     Co-evaluation               AM-PAC PT "6 Clicks" Daily Activity  Outcome Measure Difficulty turning over in bed (including adjusting bedclothes, sheets and blankets)?: A Lot Difficulty moving from lying on back to sitting on the side of the bed? : A Lot Difficulty sitting down on and standing up from a chair with arms (e.g., wheelchair, bedside commode, etc,.)?: Total Help needed moving to and from a bed to chair (including a wheelchair)?: A Little Help needed walking in hospital room?: A Little Help needed climbing 3-5 steps with a railing? : A Little 6 Click Score: 14    End of Session Equipment Utilized During Treatment: Gait belt Activity Tolerance: Patient tolerated treatment well Patient left: in chair;with call bell/phone within reach;with chair alarm set Nurse Communication: Mobility status PT Visit Diagnosis: Unsteadiness on feet (R26.81);History of falling (Z91.81)    Time: 6295-2841 PT Time Calculation (min) (ACUTE ONLY): 25 min   Charges:   PT Evaluation $PT Eval Low Complexity: 1 Procedure PT Treatments $Gait Training: 8-22 mins   PT G Codes:  Margot Chimes, PT, DPT  Acute Rehabilitation Services  Pager: [phone]   Melvyn Novas 02/21/2017, 11:33 AM

## 2017-02-21 NOTE — Progress Notes (Signed)
Patient admitted to Endo unit. Patient placed on monitor and found to be in atrial fibrillation. EKG obtained and confirmed a-fib. EP MD made aware and no need to implantable loop recorder. Procedural MD made aware. Spoke with Annie MainSharon Biby, NP and TEE cancelled. Bedside RN made aware.  Patient did state that he had a history of a-fib, he thought.

## 2017-02-22 ENCOUNTER — Inpatient Hospital Stay (HOSPITAL_COMMUNITY): Payer: BLUE CROSS/BLUE SHIELD

## 2017-02-22 DIAGNOSIS — G459 Transient cerebral ischemic attack, unspecified: Secondary | ICD-10-CM

## 2017-02-22 DIAGNOSIS — L0292 Furuncle, unspecified: Secondary | ICD-10-CM | POA: Diagnosis present

## 2017-02-22 DIAGNOSIS — N183 Chronic kidney disease, stage 3 (moderate): Secondary | ICD-10-CM

## 2017-02-22 DIAGNOSIS — E1165 Type 2 diabetes mellitus with hyperglycemia: Secondary | ICD-10-CM

## 2017-02-22 DIAGNOSIS — N179 Acute kidney failure, unspecified: Secondary | ICD-10-CM | POA: Diagnosis present

## 2017-02-22 DIAGNOSIS — Z794 Long term (current) use of insulin: Secondary | ICD-10-CM

## 2017-02-22 LAB — GLUCOSE, CAPILLARY
GLUCOSE-CAPILLARY: 93 mg/dL (ref 65–99)
GLUCOSE-CAPILLARY: 165 mg/dL — AB (ref 65–99)
Glucose-Capillary: 68 mg/dL (ref 65–99)

## 2017-02-22 LAB — BASIC METABOLIC PANEL
Anion gap: 11 (ref 5–15)
BUN: 45 mg/dL — ABNORMAL HIGH (ref 6–20)
CHLORIDE: 99 mmol/L — AB (ref 101–111)
CO2: 19 mmol/L — AB (ref 22–32)
CREATININE: 2.06 mg/dL — AB (ref 0.61–1.24)
Calcium: 8.6 mg/dL — ABNORMAL LOW (ref 8.9–10.3)
GFR calc Af Amer: 38 mL/min — ABNORMAL LOW (ref >60)
GFR calc non Af Amer: 32 mL/min — ABNORMAL LOW (ref >60)
GLUCOSE: 74 mg/dL (ref 65–99)
POTASSIUM: 3.5 mmol/L (ref 3.5–5.1)
Sodium: 129 mmol/L — ABNORMAL LOW (ref 135–145)

## 2017-02-22 LAB — ECHOCARDIOGRAM COMPLETE
Height: 74 in
Weight: 3811.2 oz

## 2017-02-22 LAB — HEMOGLOBIN A1C
Hgb A1c MFr Bld: 12.2 % — ABNORMAL HIGH (ref 4.8–5.6)
MEAN PLASMA GLUCOSE: 303 mg/dL

## 2017-02-22 MED ORDER — AMOXICILLIN-POT CLAVULANATE 875-125 MG PO TABS: 1.0000 | ORAL_TABLET | Freq: Two times a day (BID) | ORAL | 0 refills | Status: DC

## 2017-02-22 MED ORDER — NICOTINE 14 MG/24HR TD PT24: 14.0000 mg | MEDICATED_PATCH | Freq: Every day | TRANSDERMAL | 0 refills | Status: AC

## 2017-02-22 MED ORDER — APIXABAN 5 MG PO TABS: 5.0000 mg | ORAL_TABLET | Freq: Two times a day (BID) | ORAL | 0 refills | Status: DC

## 2017-02-22 NOTE — Care Management Note (Signed)
Case Management Note  Patient Details  Name: Gabriel [name] MRN: [id number] Date of Birth: 03-18-1951  Subjective/Objective:                    Action/Plan: Pt discharging home with self care. Pt states he has very supportive neighbors and a brother that lives down the street that can assist him at home.  [facility] CM provided him a list of HH agencies. He selected [facility]. [facility]. Pt with orders for 3 in 1. [facility] notified and equipment will be delivered to the room. Pt states he has transportation home.   Expected Discharge Date:  02/22/17               Expected Discharge Plan:  Home w Home Health Services  In-House Referral:     Discharge planning Services  CM Consult  Post Acute Care Choice:  Durable Medical Equipment, Home Health Choice offered to:  Patient  DME Arranged:  3-N-1 DME Agency:  [facility]  HH Arranged:  RN, PT, OT [facility]  [facility]  Status of Service:  Completed, signed off  If discussed at Long Length of Stay Meetings, dates discussed:    Additional Comments:  Kermit BaloKelli F Jayro Mcmath, RN 02/22/2017, 1:49 PM

## 2017-02-22 NOTE — Progress Notes (Signed)
  Echocardiogram 2D Echocardiogram has been performed.  Leta JunglingCooper, Maude Gloor M 02/22/2017, 8:42 AM

## 2017-02-22 NOTE — Discharge Instructions (Addendum)
Information on my medicine - ELIQUIS (apixaban)  This medication education was reviewed with me or my healthcare representative as part of my discharge preparation.  The pharmacist that spoke with me during my hospital stay was:  Elwin Sleightowell, Lisa Kay, [facility]  Why was Eliquis prescribed for you? Eliquis was prescribed for you to reduce the risk of a blood clot forming that can cause a stroke if you have a medical condition called atrial fibrillation (a type of irregular heartbeat).  What do You need to know about Eliquis ? Take your Eliquis TWICE DAILY - one tablet in the morning and one tablet in the evening with or without food. If you have difficulty swallowing the tablet whole please discuss with your pharmacist how to take the medication safely.  Take Eliquis exactly as prescribed by your doctor and DO NOT stop taking Eliquis without talking to the doctor who prescribed the medication.  Stopping may increase your risk of developing a stroke.  Refill your prescription before you run out.  After discharge, you should have regular check-up appointments with your healthcare provider that is prescribing your Eliquis.  In the future your dose may need to be changed if your kidney function or weight changes by a significant amount or as you get older.  What do you do if you miss a dose? If you miss a dose, take it as soon as you remember on the same day and resume taking twice daily.  Do not take more than one dose of ELIQUIS at the same time to make up a missed dose.  Important Safety Information A possible side effect of Eliquis is bleeding. You should call your healthcare provider right away if you experience any of the following: ? Bleeding from an injury or your nose that does not stop. ? Unusual colored urine (red or dark brown) or unusual colored stools (red or black). ? Unusual bruising for unknown reasons. ? A serious fall or if you hit your head (even if there is no bleeding).  Some  medicines may interact with Eliquis and might increase your risk of bleeding or clotting while on Eliquis. To help avoid this, consult your healthcare provider or pharmacist prior to using any new prescription or non-prescription medications, including herbals, vitamins, non-steroidal anti-inflammatory drugs (NSAIDs) and supplements.  This website has more information on Eliquis (apixaban): http://www.eliquis.com/eliquis/home    Stroke Prevention Some health problems and behaviors may make it more likely for you to have a stroke. Below are ways to lessen your risk of having a stroke.  Be active for at least 30 minutes on most or all days.  Do not smoke. Try not to be around others who smoke.  Do not drink too much alcohol.  Do not have more than 2 drinks a day if you are a man.  Do not have more than 1 drink a day if you are a woman and are not pregnant.  Eat healthy foods, such as fruits and vegetables. If you were put on a specific diet, follow the diet as told.  Keep your cholesterol levels under control through diet and medicines. Look for foods that are low in saturated fat, trans fat, cholesterol, and are high in fiber.  If you have diabetes, follow all diet plans and take your medicine as told.  Ask your doctor if you need treatment to lower your blood pressure. If you have high blood pressure (hypertension), follow all diet plans and take your medicine as told by your doctor.  If you are 28-57 years old, have your blood pressure checked every 3-5 years. If you are age 66 or older, have your blood pressure checked every year.  Keep a healthy weight. Eat foods that are low in calories, salt, saturated fat, trans fat, and cholesterol.  Do not take drugs.  Avoid birth control pills, if this applies. Talk to your doctor about the risks of taking birth control pills.  Talk to your doctor if you have sleep problems (sleep apnea).  Take all medicine as told by your  doctor.  You may be told to take aspirin or blood thinner medicine. Take this medicine as told by your doctor.  Understand your medicine instructions.  Make sure any other conditions you have are being taken care of. Get help right away if:  You suddenly lose feeling (you feel numb) or have weakness in your face, arm, or leg.  Your face or eyelid hangs down to one side.  You suddenly feel confused.  You have trouble talking (aphasia) or understanding what people are saying.  You suddenly have trouble seeing in one or both eyes.  You suddenly have trouble walking.  You are dizzy.  You lose your balance or your movements are clumsy (uncoordinated).  You suddenly have a very bad headache and you do not know the cause.  You have new chest pain.  Your heart feels like it is fluttering or skipping a beat (irregular heartbeat). Do not wait to see if the symptoms above go away. Get help right away. Call your local emergency services (911 in [place] Do not drive yourself to the hospital. This information is not intended to replace advice given to you by your health care provider. Make sure you discuss any questions you have with your health care provider. Document Released: 04/04/2012 Document Revised: 03/11/2016 Document Reviewed: 04/06/2013 Elsevier Interactive Patient Education  2017 [employer].

## 2017-02-22 NOTE — Progress Notes (Addendum)
Hypoglycemic Event  CBG:68  Treatment: 15 GM carbohydrate snack  Symptoms: None  Follow-up CBG Time0650 CBG Result:93  Possible Reasons for Event: Inadequate meal intake  Comments/MD notified:N/A    Gabriel RosierEunice O Ranger [name]

## 2017-02-22 NOTE — Progress Notes (Signed)
Pt being discharged from hospital per orders from MD. Pt educated on discharge instructions. Pt verbalized understanding of instructions. All questions and concerns were addressed. Pt's IV was removed prior to discharge. Pt exited hospital via wheelchair. 

## 2017-02-22 NOTE — Discharge Summary (Signed)
Physician Discharge Summary  Armari Fussell ZOX:[id number] DOB: [birth year] DOA: 02/18/2017  PCP: Bradd Canary, MD  Admit date: 02/18/2017 Discharge date: 02/22/2017  Admitted From: home Disposition:  home  Recommendations for Outpatient Follow-up:  1. Follow up with PCP in 1 week.Please monitor renal function dating outpatient follow-up. 2. Follow up in stroke clinic in 6 weeks 3. Complete 7 day antibiotic course on 5/14  Home Health:RN and PT Equipment/Devices: 3 in 1  Discharge Condition: FAIR CODE STATUS: FULL CODE Diet recommendation: Heart Healthy / Carb Modified      Discharge Diagnoses:  Principal Problem:   Acute CVA (cerebrovascular accident) (HCC)   Active Problems:   Paroxysmal atrial fibrillation (HCC)   HTN (hypertension)   Obesity   Tobacco abuse   Allergic state   Hyperlipidemia   Hereditary and idiopathic peripheral neuropathy   Hyponatremia   Elevated troponin   Uncontrolled type 2 diabetes mellitus with hyperglycemia, with long-term current use of insulin (HCC)   Acute renal failure superimposed on stage 3 chronic kidney disease (HCC)   Furunculosis  Brief narrative/history of present illness 66 year old male with history of coronary artery disease, CHF, depression, diabetes mellitus type 2 with neuropathy, ongoing tobacco use, hypertension, hyperlipidemia, 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 While at the doctor's office he had sudden onset of slurred speech with right facial droop as well as right arm weakness and tongue numbness. He was sent to the ED. Since symptom had resolved by then tPA was not administered. Patient admitted for acute stroke.  Hospital course  Active Problems: Acute ischemic vs embolic stroke MRI brain showing subcentimeter focus of left medial parietal lobe acute/early subacute infarct. Risk factors include ongoing tobacco use, hypertension, hyperlipidemia,  diabetes, coronary artery disease and prior stroke history. Carotid Doppler without significant stenosis. Doppler lower extremities negative for DVT.  -Patient was scheduled for TEE and intractable low recorder but found to have A. fib on EKG and was thus started on Eliquis. -2-D echo shows normal EF, no wall motion abnormality and grade 2 diastolic dysfunction. -Continue baby aspirin. High triglycerides. Continue gemfibrozil and statin. A1c of 12 Seen by PT and recommends home health.  -Follow-up in stroke clinic in 6-8 weeks.  Active problems Hyponatremia Possibly secondary to hyperglycemia and diuretic use. Diuretics held. TSH normal. Improved with hydration.   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 Blood glucose of 470 on admission. Patient is on high-dose Lantus (240 units daily with pre-meal aspart). Placed him back on his Lantus in divided doses (120 units twice a day) and increase pre-meal aspart. CBG improved.  A1c of 12. Resume Lantus at home dose. Discontinue metformin given worsened renal function.  Intractable hiccups Improved with when necessary chlorpromazine  Acute on CKD (chronic kidney disease) stage 3, GFR 30-59 ml/min Discontinue HCTZ-triamterene and lisinopril. Improving with IV hydration. Resume Lasix. Discontinue metformin. Follow-up labs during PCP follow-up next week.  Ongoing tobacco use Counseled on cessation. Nicotine patch ordered.  Elevated troponin No chest pain symptoms or EKG changes. Troponin peaked at 0.08. Stable on telemetry. 2-D echo with normal EF of 65% and grade 2 diastolic dysfunction. No wall motion abnormality. Had nuclear study in 09/2013 showed normal perfusion with EF of 39%. Had cardiac cath in 2011 (in [facility]) with 30% right coronary artery stenosis.   Chronic systolic CHF Euvolemic. 2-D echo with normal EF, grade 2 diastolic dysfunction. Lasix and ACE  inhibitor held for  hyponatremia and AKI.   received hydration. Resume Lasix upon discharge along with beta blocker and statin. Discontinue lisinopril given worsening renal failure.   GERD Continue PPI   Depression Continue Zoloft   Hypertriglyceridemia Continue gemfibrozil  Left back furuncle Had fever of 101F . Expressed some pus from the back and clean dressing applied. Started on Augmentin for a seven-day course. Symptoms better now.        Disposition Plan  : Home with home health  Consults  :   Stroke  Procedures  : MRI brain Head CT   Discharge Instructions  Discharge Instructions    Ambulatory referral to Neurology    Complete by:  As directed    Pt will follow up with stroke clinic Darrol Angel preferred first) at [facility] in about 2 months. Thanks.   Ambulatory referral to Neurology    Complete by:  As directed    An appointment is requested in approximately: 6 weeks     Allergies as of 02/22/2017   No Known Allergies     Medication List    STOP taking these medications   cyclobenzaprine 10 MG tablet Commonly known as:  FLEXERIL   lisinopril 40 MG tablet Commonly known as:  PRINIVIL,ZESTRIL   metFORMIN 500 MG tablet Commonly known as:  GLUCOPHAGE   methocarbamol 500 MG tablet Commonly known as:  ROBAXIN   nicotine 7 mg/24hr patch Commonly known as:  NICODERM CQ - dosed in mg/24 hr Replaced by:  nicotine 14 mg/24hr patch   triamterene-hydrochlorothiazide 37.5-25 MG tablet Commonly known as:  MAXZIDE-25     TAKE these medications   albuterol 108 (90 Base) MCG/ACT inhaler Commonly known as:  PROVENTIL HFA;VENTOLIN HFA Inhale 2 puffs into the lungs every 6 (six) hours as needed for wheezing.   amLODipine 10 MG tablet Commonly known as:  NORVASC TAKE 1 TABLET DAILY   amoxicillin-clavulanate 875-125 MG tablet Commonly known as:  AUGMENTIN Take 1 tablet by mouth every 12 (twelve) hours.   apixaban 5 MG Tabs tablet Commonly known as:   ELIQUIS Take 1 tablet (5 mg total) by mouth 2 (two) times daily.   aspirin 81 MG tablet Take 81 mg by mouth daily.   atorvastatin 40 MG tablet Commonly known as:  LIPITOR TAKE 1 TABLET DAILY   carvedilol 25 MG tablet Commonly known as:  COREG TAKE ONE AND ONE-HALF TABLETS TWICE A DAY What changed:  See the new instructions.   chlorproMAZINE 25 MG tablet Commonly known as:  THORAZINE Take 1 tablet (25 mg total) by mouth 3 (three) times daily.   furosemide 40 MG tablet Commonly known as:  LASIX Take 0.5 tablets (20 mg total) by mouth daily.   gemfibrozil 600 MG tablet Commonly known as:  LOPID TAKE 1 TABLET TWICE A DAY BEFORE MEALS   glucose blood test strip Commonly known as:  ONETOUCH VERIO 1 each by Other route 2 (two) times daily. And lancets 2/day   Insulin Glargine 100 UNIT/ML Solostar Pen Commonly known as:  LANTUS SOLOSTAR Inject 240 Units into the skin daily.   Insulin Pen Needle 31G X 5 MM Misc USE AS DIRECTED ONE TIME DAILY.   Krill [employer] or a generic by schiff daily   multivitamin tablet Take 1 tablet by mouth daily.   nicotine 14 mg/24hr patch Commonly known as:  NICODERM CQ - dosed in mg/24 hours Place 1 patch (14 mg total) onto the skin daily. Replaces:  nicotine 7 mg/24hr  patch   PROBIOTIC DAILY PO Take 1 capsule by mouth daily.   sertraline 100 MG tablet Commonly known as:  ZOLOFT TAKE ONE AND ONE-HALF TABLETS DAILY What changed:  See the new instructions.      Follow-up Information    Nilda Riggs, NP. Schedule an appointment as soon as possible for a visit in 6 week(s).   Specialty:  Family Medicine Contact information: [address] [place] [address] [phone]          No Known Allergies      Procedures/Studies: Dg Chest 2 View  Result Date: 02/19/2017 CLINICAL DATA:  Acute wheezing EXAM: CHEST  2 VIEW COMPARISON:  05/29/2015 FINDINGS: The heart size and mediastinal contours are  within normal limits. Both lungs are clear. The visualized skeletal structures are unremarkable. Lower cervical fusion hardware partially imaged. Very minor thoracic aortic atherosclerosis present. Trachea is midline. No pleural effusion or pneumothorax. IMPRESSION: No active cardiopulmonary disease. Electronically Signed   By: Judie Petit.  Shick M.D.   On: 02/19/2017 10:11   Mr Brain Wo Contrast  Result Date: 02/18/2017 CLINICAL DATA:  66 y/o  M; slurred speech and weakness. EXAM: MRI HEAD WITHOUT CONTRAST MRA HEAD WITHOUT CONTRAST TECHNIQUE: Multiplanar, multiecho pulse sequences of the brain and surrounding structures were obtained without intravenous contrast. Angiographic images of the head were obtained using MRA technique without contrast. COMPARISON:  02/18/2017 CT of head. FINDINGS: MRI HEAD FINDINGS Brain: Subcentimeter focus of reduced diffusion within the left medial parietal lobe (series 10, image 7). Scattered nonspecific foci of T2 FLAIR hyperintense signal abnormality in subcortical and periventricular white matter compatible with mild chronic microvascular ischemic changes. Mild diffuse brain parenchymal volume loss. No abnormal susceptibility hypointensity to indicate intracranial hemorrhage. No hydrocephalus, extra-axial collection, or focal mass effect. Vascular: As below. Skull and upper cervical spine: Normal marrow signal. Sinuses/Orbits: Negative. Other: Bilateral intra-ocular lens replacement. MRA HEAD FINDINGS Internal carotid arteries:  Patent. Anterior cerebral arteries:  Patent. Middle cerebral arteries: Patent. Anterior communicating artery: Not identified, likely hypoplastic or absent. Posterior communicating arteries: Not identified, likely hypoplastic or absent. Posterior cerebral arteries:  Patent. Basilar artery:  Patent. Vertebral arteries:  Patent. No evidence of high-grade stenosis, large vessel occlusion, or aneurysm unless noted above. IMPRESSION: 1. Subcentimeter focus of reduced  diffusion within left medial parietal lobe compatible with acute/early subacute infarction. No intracranial hemorrhage. 2. Mild chronic microvascular ischemic changes and mild parenchymal volume loss of the brain. 3. Patent circle of Willis. No large vessel occlusion, aneurysm, or significant stenosis is identified. These results will be called to the ordering clinician or representative by the Radiologist Assistant, and communication documented in the PACS or zVision Dashboard. Electronically Signed   By: Mitzi Hansen M.D.   On: 02/18/2017 21:54   [name] Head/brain BJ Cm  Result Date: 02/18/2017 CLINICAL DATA:  65 y/o  M; slurred speech and weakness. EXAM: MRI HEAD WITHOUT CONTRAST MRA HEAD WITHOUT CONTRAST TECHNIQUE: Multiplanar, multiecho pulse sequences of the brain and surrounding structures were obtained without intravenous contrast. Angiographic images of the head were obtained using MRA technique without contrast. COMPARISON:  02/18/2017 CT of head. FINDINGS: MRI HEAD FINDINGS Brain: Subcentimeter focus of reduced diffusion within the left medial parietal lobe (series 10, image 7). Scattered nonspecific foci of T2 FLAIR hyperintense signal abnormality in subcortical and periventricular white matter compatible with mild chronic microvascular ischemic changes. Mild diffuse brain parenchymal volume loss. No abnormal susceptibility hypointensity to indicate intracranial hemorrhage. No hydrocephalus, extra-axial collection,  or focal mass effect. Vascular: As below. Skull and upper cervical spine: Normal marrow signal. Sinuses/Orbits: Negative. Other: Bilateral intra-ocular lens replacement. MRA HEAD FINDINGS Internal carotid arteries:  Patent. Anterior cerebral arteries:  Patent. Middle cerebral arteries: Patent. Anterior communicating artery: Not identified, likely hypoplastic or absent. Posterior communicating arteries: Not identified, likely hypoplastic or absent. Posterior cerebral arteries:   Patent. Basilar artery:  Patent. Vertebral arteries:  Patent. No evidence of high-grade stenosis, large vessel occlusion, or aneurysm unless noted above. IMPRESSION: 1. Subcentimeter focus of reduced diffusion within left medial parietal lobe compatible with acute/early subacute infarction. No intracranial hemorrhage. 2. Mild chronic microvascular ischemic changes and mild parenchymal volume loss of the brain. 3. Patent circle of Willis. No large vessel occlusion, aneurysm, or significant stenosis is identified. These results will be called to the ordering clinician or representative by the Radiologist Assistant, and communication documented in the PACS or zVision Dashboard. Electronically Signed   By: Mitzi Hansen M.D.   On: 02/18/2017 21:54   Ct Head Code Stroke W/o Cm  Result Date: 02/18/2017 CLINICAL DATA:  Code stroke. Slurred speech and right-sided weakness. EXAM: CT HEAD WITHOUT CONTRAST TECHNIQUE: Contiguous axial images were obtained from the base of the skull through the vertex without intravenous contrast. COMPARISON:  None FINDINGS: Brain: No evidence of acute infarction, hemorrhage, hydrocephalus, extra-axial collection or mass lesion/mass effect. Vascular: Atherosclerotic calcification.  No hyperdense vessel Skull: No acute or aggressive finding Sinuses/Orbits: Bilateral cataract resection.  No acute finding Other: These results were called by telephone at the time of interpretation on 02/18/2017 at 2:07 pm to Dr. Loren Racer , who verbally acknowledged these results. ASPECTS [facility] Stroke Program Early CT Score) - Ganglionic level infarction (caudate, lentiform nuclei, internal capsule, insula, M1-M3 cortex): 7 - Supraganglionic infarction (M4-M6 cortex): 3 Total score (0-10 with 10 being normal): 10 IMPRESSION: No acute finding.ASPECTS is 10. Electronically Signed   By: Marnee Spring M.D.   On: 02/18/2017 14:09    2-D echo Study Conclusions  - Left ventricle: The cavity  size was normal. Wall thickness was   increased in a pattern of severe LVH. Systolic function was   vigorous. The estimated ejection fraction was in the range of 65%   to 70%. Wall motion was normal; there were no regional wall   motion abnormalities. Features are consistent with a pseudonormal   left ventricular filling pattern, with concomitant abnormal   relaxation and increased filling pressure (grade 2 diastolic   dysfunction). - Aortic valve: There was mild regurgitation. Valve area (VTI):   1.22 cm^2. Valve area (Vmax): 1.12 cm^2. Valve area (Vmean): 1.01   cm^2. - Mitral valve: Mildly calcified annulus. There was mild   regurgitation.   Subjective: No overnight events. CBG dropped to 68 this morning as patient had poor by mouth intake. EKG done prior to TEE showed A. Fib. TEE and loop recorder was canceled and patient placed on anticoagulation.  Discharge Exam: Vitals:   02/22/17 0538 02/22/17 0941  BP: (!) 101/55 (!) 109/50  Pulse: 79 81  Resp: 18 18  Temp: 98.4 F (36.9 C) 98.6 F (37 C)   Vitals:   02/21/17 2221 02/22/17 0057 02/22/17 0538 02/22/17 0941  BP: 95/70 (!) 102/59 (!) 101/55 (!) 109/50  Pulse:  93 79 81  Resp:  16 18 18   Temp:  98.6 F (37 C) 98.4 F (36.9 C) 98.6 F (37 C)  TempSrc:  Oral Oral Oral  SpO2:  98% 97% 100%  Gen: not in distress HEENT: moist mucosa, supple neck Chest: Clear to auscultation bilaterally CVS: N S1&S2, no murmurs,  GI: soft, NT, ND,  Musculoskeletal: warm, no edema, Clean dressing over left back CNS: Alert and oriented, Nonfocal     The results of significant diagnostics from this hospitalization (including imaging, microbiology, ancillary and laboratory) are listed below for reference.     Microbiology: Recent Results (from the past 240 hour(s))  MRSA PCR Screening     Status: None   Collection Time: 02/18/17  6:24 PM  Result Value Ref Range Status   MRSA by PCR NEGATIVE NEGATIVE Final    Comment:         The GeneXpert MRSA Assay (FDA approved for NASAL specimens only), is one component of a comprehensive MRSA colonization surveillance program. It is not intended to diagnose MRSA infection nor to guide or monitor treatment for MRSA infections.   Culture, blood (routine x 2)     Status: None (Preliminary result)   Collection Time: 02/20/17  4:48 PM  Result Value Ref Range Status   Specimen Description BLOOD RIGHT ARM  Final   Special Requests IN PEDIATRIC BOTTLE Blood Culture adequate volume  Final   Culture NO GROWTH < 24 HOURS  Final   Report Status PENDING  Incomplete  Culture, blood (routine x 2)     Status: None (Preliminary result)   Collection Time: 02/20/17  4:48 PM  Result Value Ref Range Status   Specimen Description BLOOD RIGHT ARM  Final   Special Requests IN PEDIATRIC BOTTLE Blood Culture adequate volume  Final   Culture NO GROWTH < 24 HOURS  Final   Report Status PENDING  Incomplete     Labs: BNP (last 3 results) No results for input(s): BNP in the last 8760 hours. Basic Metabolic Panel:  Recent Labs Lab 02/18/17 2204 02/19/17 0920 02/20/17 0427 02/21/17 0415 02/22/17 0354  NA 124* 126* 128* 126* 129*  K 4.1 4.4 4.2 3.6 3.5  CL 91* 91* 93* 96* 99*  CO2 21* 20* 24 21* 19*  GLUCOSE 291* 347* 176* 155* 74  BUN 32* 29* 38* 46* 45*  CREATININE 1.60* 1.48* 1.87* 2.05* 2.06*  CALCIUM 8.8* 9.3 9.2 8.5* 8.6*   Liver Function Tests:  Recent Labs Lab 02/18/17 1400  AST 14*  ALT 13*  ALKPHOS 92  BILITOT 0.6  PROT 7.8  ALBUMIN 4.0   No results for input(s): LIPASE, AMYLASE in the last 168 hours. No results for input(s): AMMONIA in the last 168 hours. CBC:  Recent Labs Lab 02/18/17 1400  WBC 11.0*  NEUTROABS 9.1*  HGB 13.7  HCT 37.9*  MCV 88.3  PLT 211   Cardiac Enzymes:  Recent Labs Lab 02/18/17 1400 02/18/17 1706 02/18/17 2204 02/19/17 0415  TROPONINI 0.08* 0.07* 0.06* 0.06*   BNP: Invalid input(s): POCBNP CBG:  Recent Labs Lab  02/21/17 1620 02/21/17 2130 02/22/17 0612 02/22/17 0650 02/22/17 1128  GLUCAP 189* 118* 68 93 165*   D-Dimer No results for input(s): DDIMER in the last 72 hours. Hgb A1c  Recent Labs  02/21/17 0415  HGBA1C 12.2*   Lipid Profile No results for input(s): CHOL, HDL, LDLCALC, TRIG, CHOLHDL, LDLDIRECT in the last 72 hours. Thyroid function studies No results for input(s): TSH, T4TOTAL, T3FREE, THYROIDAB in the last 72 hours.  Invalid input(s): FREET3 Anemia work up No results for input(s): VITAMINB12, FOLATE, FERRITIN, TIBC, IRON, RETICCTPCT in the last 72 hours. Urinalysis    Component Value Date/Time  COLORURINE YELLOW 02/18/2017 1934   APPEARANCEUR CLEAR 02/18/2017 1934   LABSPEC 1.014 02/18/2017 1934   PHURINE 5.0 02/18/2017 1934   GLUCOSEU >=500 (A) 02/18/2017 1934   HGBUR SMALL (A) 02/18/2017 1934   BILIRUBINUR NEGATIVE 02/18/2017 1934   KETONESUR NEGATIVE 02/18/2017 1934   PROTEINUR 100 (A) 02/18/2017 1934   NITRITE NEGATIVE 02/18/2017 1934   LEUKOCYTESUR NEGATIVE 02/18/2017 1934   Sepsis Labs Invalid input(s): PROCALCITONIN,  WBC,  LACTICIDVEN Microbiology Recent Results (from the past 240 hour(s))  MRSA PCR Screening     Status: None   Collection Time: 02/18/17  6:24 PM  Result Value Ref Range Status   MRSA by PCR NEGATIVE NEGATIVE Final    Comment:        The GeneXpert MRSA Assay (FDA approved for NASAL specimens only), is one component of a comprehensive MRSA colonization surveillance program. It is not intended to diagnose MRSA infection nor to guide or monitor treatment for MRSA infections.   Culture, blood (routine x 2)     Status: None (Preliminary result)   Collection Time: 02/20/17  4:48 PM  Result Value Ref Range Status   Specimen Description BLOOD RIGHT ARM  Final   Special Requests IN PEDIATRIC BOTTLE Blood Culture adequate volume  Final   Culture NO GROWTH < 24 HOURS  Final   Report Status PENDING  Incomplete  Culture, blood (routine  x 2)     Status: None (Preliminary result)   Collection Time: 02/20/17  4:48 PM  Result Value Ref Range Status   Specimen Description BLOOD RIGHT ARM  Final   Special Requests IN PEDIATRIC BOTTLE Blood Culture adequate volume  Final   Culture NO GROWTH < 24 HOURS  Final   Report Status PENDING  Incomplete     Time coordinating discharge: Over 30 minutes  SIGNED:   Eddie NorthHUNGEL, Kevork Joyce, MD  [facility] 02/22/2017, 12:33 PM Pager   If 7PM-7AM, please contact night-coverage [email or web address] Password [email or web address]

## 2017-02-23 ENCOUNTER — Telehealth: Payer: Self-pay | Admitting: Behavioral Health

## 2017-02-23 NOTE — Telephone Encounter (Signed)
Patient declines hospital follow-up visit at this time, instead he would like to keep his current appointment on 03/04/17 at 8:30 AM with PCP for an annual physical.

## 2017-02-25 LAB — CULTURE, BLOOD (ROUTINE X 2)
CULTURE: NO GROWTH
Culture: NO GROWTH
SPECIAL REQUESTS: ADEQUATE
Special Requests: ADEQUATE

## 2017-02-28 ENCOUNTER — Telehealth: Payer: Self-pay | Admitting: Family Medicine

## 2017-02-28 MED ORDER — APIXABAN 5 MG PO TABS: 5.0000 mg | ORAL_TABLET | Freq: Two times a day (BID) | ORAL | 2 refills | Status: AC

## 2017-02-28 NOTE — Telephone Encounter (Signed)
I am willing to write a refill on the Eliquis the hospital started at same dose and same sig although I worry about insurance and the PA process but provide a 30 day rx and we will see. Less inclined to refill the Augmentin. What symptoms of infection is he currently havign that makes him think he still needs it. His blood cultures from hospital are negative

## 2017-02-28 NOTE — Telephone Encounter (Signed)
Patient notified of PCP instructions. Sent in the eliquis. He has no symptoms and agreed with PCP regarding that medication.

## 2017-02-28 NOTE — Telephone Encounter (Signed)
Advise on these refills--looks like was done by MD in the hospital

## 2017-02-28 NOTE — Telephone Encounter (Signed)
Please refax Rx to [facility]  apixaban (ELIQUIS) 5 MG TABS tablet [[id number] amoxicillin-clavulanate (AUGMENTIN) 875-125 MG tablet [[id number]][[id number]]

## 2017-03-02 ENCOUNTER — Telehealth: Payer: Self-pay | Admitting: Family Medicine

## 2017-03-02 NOTE — Telephone Encounter (Signed)
Caller name: Patty Relation to pt: RN Call back number: [phone]  Pharmacy [id number] - [place], [place] - [address]   Reason for call:  RN wanted to inform PCP patient didn't pick up apixaban (ELIQUIS) 5 MG TABS tablet and amoxicillin-clavulanate (AUGMENTIN) 875-125 MG tablet due patient being release from the hospital.    Requesting refills:  amLODipine (NORVASC) 10 MG tablet furosemide (LASIX) 40 MG tablet  aspirin 81 MG tablet

## 2017-03-03 MED ORDER — FUROSEMIDE 40 MG PO TABS: 20.0000 mg | ORAL_TABLET | Freq: Every day | ORAL | 3 refills | Status: AC

## 2017-03-03 MED ORDER — AMLODIPINE BESYLATE 10 MG PO TABS: 10.0000 mg | ORAL_TABLET | Freq: Every day | ORAL | 1 refills | Status: AC

## 2017-03-03 MED ORDER — ASPIRIN 81 MG PO TABS: 81.0000 mg | ORAL_TABLET | Freq: Every day | ORAL | 3 refills | Status: AC

## 2017-03-03 NOTE — Telephone Encounter (Signed)
Sent in refills 

## 2017-03-04 ENCOUNTER — Telehealth: Payer: Self-pay | Admitting: *Deleted

## 2017-03-04 ENCOUNTER — Encounter: Payer: Self-pay | Admitting: Family Medicine

## 2017-03-04 ENCOUNTER — Encounter (HOSPITAL_BASED_OUTPATIENT_CLINIC_OR_DEPARTMENT_OTHER): Payer: Self-pay | Admitting: Emergency Medicine

## 2017-03-04 ENCOUNTER — Emergency Department (HOSPITAL_BASED_OUTPATIENT_CLINIC_OR_DEPARTMENT_OTHER)
Admission: EM | Admit: 2017-03-04 | Discharge: 2017-03-04 | Disposition: A | Discharge status: Other Acute Inpatient Hospital | Payer: BLUE CROSS/BLUE SHIELD | Attending: Emergency Medicine | Admitting: Emergency Medicine

## 2017-03-04 ENCOUNTER — Telehealth: Payer: Self-pay | Admitting: Emergency Medicine

## 2017-03-04 ENCOUNTER — Emergency Department (HOSPITAL_BASED_OUTPATIENT_CLINIC_OR_DEPARTMENT_OTHER): Payer: BLUE CROSS/BLUE SHIELD

## 2017-03-04 ENCOUNTER — Ambulatory Visit (INDEPENDENT_AMBULATORY_CARE_PROVIDER_SITE_OTHER): Payer: BLUE CROSS/BLUE SHIELD | Admitting: Family Medicine

## 2017-03-04 VITALS — BP 90/40 | HR 83 | Temp 98.3°F | Resp 18 | Wt 244.0 lb

## 2017-03-04 DIAGNOSIS — R319 Hematuria, unspecified: Secondary | ICD-10-CM

## 2017-03-04 DIAGNOSIS — F17200 Nicotine dependence, unspecified, uncomplicated: Secondary | ICD-10-CM | POA: Insufficient documentation

## 2017-03-04 DIAGNOSIS — E871 Hypo-osmolality and hyponatremia: Secondary | ICD-10-CM | POA: Diagnosis not present

## 2017-03-04 DIAGNOSIS — E118 Type 2 diabetes mellitus with unspecified complications: Secondary | ICD-10-CM

## 2017-03-04 DIAGNOSIS — I130 Hypertensive heart and chronic kidney disease with heart failure and stage 1 through stage 4 chronic kidney disease, or unspecified chronic kidney disease: Secondary | ICD-10-CM | POA: Insufficient documentation

## 2017-03-04 DIAGNOSIS — I10 Essential (primary) hypertension: Secondary | ICD-10-CM | POA: Diagnosis not present

## 2017-03-04 DIAGNOSIS — Z794 Long term (current) use of insulin: Secondary | ICD-10-CM

## 2017-03-04 DIAGNOSIS — N183 Chronic kidney disease, stage 3 (moderate): Secondary | ICD-10-CM | POA: Insufficient documentation

## 2017-03-04 DIAGNOSIS — R7989 Other specified abnormal findings of blood chemistry: Principal | ICD-10-CM

## 2017-03-04 DIAGNOSIS — E1169 Type 2 diabetes mellitus with other specified complication: Secondary | ICD-10-CM

## 2017-03-04 DIAGNOSIS — R9431 Abnormal electrocardiogram [ECG] [EKG]: Secondary | ICD-10-CM | POA: Diagnosis not present

## 2017-03-04 DIAGNOSIS — I509 Heart failure, unspecified: Secondary | ICD-10-CM | POA: Insufficient documentation

## 2017-03-04 DIAGNOSIS — Z8673 Personal history of transient ischemic attack (TIA), and cerebral infarction without residual deficits: Secondary | ICD-10-CM | POA: Diagnosis not present

## 2017-03-04 DIAGNOSIS — I959 Hypotension, unspecified: Secondary | ICD-10-CM | POA: Diagnosis not present

## 2017-03-04 DIAGNOSIS — Z7982 Long term (current) use of aspirin: Secondary | ICD-10-CM | POA: Diagnosis not present

## 2017-03-04 DIAGNOSIS — D72829 Elevated white blood cell count, unspecified: Secondary | ICD-10-CM | POA: Diagnosis not present

## 2017-03-04 DIAGNOSIS — R748 Abnormal levels of other serum enzymes: Secondary | ICD-10-CM

## 2017-03-04 DIAGNOSIS — E1122 Type 2 diabetes mellitus with diabetic chronic kidney disease: Secondary | ICD-10-CM | POA: Insufficient documentation

## 2017-03-04 DIAGNOSIS — J189 Pneumonia, unspecified organism: Secondary | ICD-10-CM | POA: Diagnosis not present

## 2017-03-04 DIAGNOSIS — E669 Obesity, unspecified: Secondary | ICD-10-CM | POA: Diagnosis not present

## 2017-03-04 DIAGNOSIS — R778 Other specified abnormalities of plasma proteins: Secondary | ICD-10-CM

## 2017-03-04 DIAGNOSIS — R05 Cough: Secondary | ICD-10-CM | POA: Diagnosis present

## 2017-03-04 HISTORY — DX: Elevated white blood cell count, unspecified: D72.829

## 2017-03-04 LAB — COMPREHENSIVE METABOLIC PANEL
ALT: 7 U/L (ref 0–53)
AST: 8 U/L (ref 0–37)
Albumin: 3.5 g/dL (ref 3.5–5.2)
Alkaline Phosphatase: 68 U/L (ref 39–117)
BILIRUBIN TOTAL: 0.4 mg/dL (ref 0.2–1.2)
BUN: 21 mg/dL (ref 6–23)
CALCIUM: 8.9 mg/dL (ref 8.4–10.5)
CHLORIDE: 91 meq/L — AB (ref 96–112)
CO2: 26 mEq/L (ref 19–32)
CREATININE: 2.12 mg/dL — AB (ref 0.40–1.50)
GFR: 33.59 mL/min — ABNORMAL LOW (ref >60.00)
GLUCOSE: 370 mg/dL — AB (ref 70–99)
Potassium: 5.6 mEq/L — ABNORMAL HIGH (ref 3.5–5.1)
Sodium: 125 mEq/L — ABNORMAL LOW (ref 135–145)
Total Protein: 6.7 g/dL (ref 6.0–8.3)

## 2017-03-04 LAB — URINALYSIS, ROUTINE W REFLEX MICROSCOPIC
Bilirubin Urine: NEGATIVE
Hgb urine dipstick: NEGATIVE
KETONES UR: NEGATIVE
LEUKOCYTES UA: NEGATIVE
NITRITE: NEGATIVE
SPECIFIC GRAVITY, URINE: 1.02 (ref 1.000–1.030)
TOTAL PROTEIN, URINE-UPE24: 100 — AB
URINE GLUCOSE: 500 — AB
Urobilinogen, UA: 0.2 (ref 0.0–1.0)
pH: 6 (ref 5.0–8.0)

## 2017-03-04 LAB — STREP PNEUMONIAE URINARY ANTIGEN: Strep Pneumo Urinary Antigen: NEGATIVE

## 2017-03-04 LAB — TSH: TSH: 0.77 u[IU]/mL (ref 0.35–4.50)

## 2017-03-04 LAB — CBC WITH DIFFERENTIAL/PLATELET
BASOS ABS: 0.1 10*3/uL (ref 0.0–0.1)
BASOS PCT: 0.5 % (ref 0.0–3.0)
EOS ABS: 0.2 10*3/uL (ref 0.0–0.7)
Eosinophils Relative: 0.8 % (ref 0.0–5.0)
HCT: 33 % — ABNORMAL LOW (ref 39.0–52.0)
Hemoglobin: 11 g/dL — ABNORMAL LOW (ref 13.0–17.0)
LYMPHS ABS: 1.3 10*3/uL (ref 0.7–4.0)
Lymphocytes Relative: 6.1 % — ABNORMAL LOW (ref 12.0–46.0)
MCHC: 33.5 g/dL (ref 30.0–36.0)
MCV: 91 fl (ref 78.0–100.0)
MONO ABS: 1.3 10*3/uL — AB (ref 0.1–1.0)
Monocytes Relative: 5.8 % (ref 3.0–12.0)
NEUTROS ABS: 18.9 10*3/uL — AB (ref 1.4–7.7)
PLATELETS: NORMAL 10*3/uL (ref 150.0–400.0)
RBC: 3.62 Mil/uL — ABNORMAL LOW (ref 4.22–5.81)
RDW: 13.8 % (ref 11.5–15.5)
WBC: 21.7 10*3/uL (ref 4.0–10.5)

## 2017-03-04 LAB — I-STAT CG4 LACTIC ACID, ED: LACTIC ACID, VENOUS: 0.78 mmol/L (ref 0.5–1.9)

## 2017-03-04 MED ORDER — VANCOMYCIN HCL 10 G IV SOLR
1250.0000 mg | INTRAVENOUS | Status: DC
  Filled 2017-03-04: qty 1250

## 2017-03-04 MED ORDER — IPRATROPIUM-ALBUTEROL 0.5-2.5 (3) MG/3ML IN SOLN
RESPIRATORY_TRACT | Status: AC
  Administered 2017-03-04: 3 mL
  Filled 2017-03-04: qty 3

## 2017-03-04 MED ORDER — SODIUM CHLORIDE 0.9 % IV SOLN
Freq: Once | INTRAVENOUS | Status: AC
  Administered 2017-03-04: 17:00:00 via INTRAVENOUS

## 2017-03-04 MED ORDER — DEXTROSE 5 % IV SOLN
1.0000 g | Freq: Three times a day (TID) | INTRAVENOUS | Status: DC
  Administered 2017-03-04: 1 g via INTRAVENOUS

## 2017-03-04 MED ORDER — CEFEPIME HCL 1 G IJ SOLR
INTRAMUSCULAR | Status: AC
  Filled 2017-03-04: qty 1

## 2017-03-04 MED ORDER — VANCOMYCIN HCL IN DEXTROSE 1-5 GM/200ML-% IV SOLN
1000.0000 mg | Freq: Once | INTRAVENOUS | Status: AC
  Administered 2017-03-04: 1000 mg via INTRAVENOUS
  Filled 2017-03-04: qty 200

## 2017-03-04 MED ORDER — IPRATROPIUM-ALBUTEROL 0.5-2.5 (3) MG/3ML IN SOLN
3.0000 mL | Freq: Four times a day (QID) | RESPIRATORY_TRACT | Status: DC
  Administered 2017-03-04: 3 mL via RESPIRATORY_TRACT
  Filled 2017-03-04: qty 3

## 2017-03-04 MED ORDER — CARVEDILOL 25 MG PO TABS: 25.0000 mg | ORAL_TABLET | Freq: Two times a day (BID) | ORAL | 1 refills | Status: AC

## 2017-03-04 MED ORDER — ALBUTEROL SULFATE (2.5 MG/3ML) 0.083% IN NEBU
INHALATION_SOLUTION | RESPIRATORY_TRACT | Status: AC
  Administered 2017-03-04: 2.5 mg
  Filled 2017-03-04: qty 3

## 2017-03-04 MED ORDER — AMOXICILLIN-POT CLAVULANATE 875-125 MG PO TABS: 1.0000 | ORAL_TABLET | Freq: Two times a day (BID) | ORAL | 0 refills | Status: AC

## 2017-03-04 NOTE — Telephone Encounter (Signed)
Received Physician Orders from [facility] for PT, forwarded to provider/SLS 05/18

## 2017-03-04 NOTE — Telephone Encounter (Signed)
Called him back and informed of PCP instructions. He did agree and is on [facility] [place]. Spoke to the ER MD informed to be on the look out for him

## 2017-03-04 NOTE — Telephone Encounter (Signed)
"  CRITICAL VALUE STICKER  CRITICAL VALUE:White Count 21.7  RECEIVER (on-site recipient of call):Kristy P.  DATE & TIME NOTIFIED: 1348 03-04-17  MESSENGER (representative from lab):Karen  MD NOTIFIED: [place], Doc of the Day  TIME OF NOTIFICATION:1355  RESPONSE:

## 2017-03-04 NOTE — ED Notes (Signed)
Blood cultures obtained and held for orders at this time.

## 2017-03-04 NOTE — ED Notes (Signed)
Pt wheeled to room via wheelchair. Able to stand and pivot to lowered bed

## 2017-03-04 NOTE — Assessment & Plan Note (Signed)
Sugars have been increasing recently. Over 300 today. Given samples of Humalog flexpen to use 5 units tid with meals and when sugar is over 250. He is given new referral to endocrinology

## 2017-03-04 NOTE — Progress Notes (Signed)
Subjective:  I acted as a Neurosurgeonscribe for Dr. Abner GreenspanBlyth. Gabriel, [name]  Patient ID: Gabriel [name], male    DOB: [birth year], 66 y.o.   MRN: [id number]  Chief Complaint  Patient presents with  . Annual Exam  . Hypertension  . Hyperglycemia    HPI  Patient is in today for an annual exam. He is following up on his HTN, hyperlipidemia, and other medical concerns. Patient recently had a stroke. He presents today looking very weak and fatigue. His bp is very low 90/40 and his fasting blood sugar have risen above 300. He denies any new neuroloigc symptoms or falls since returning home. Endorses anorexia but denies vomitting or diarrhea. Completed a course of antibiotics for a skin infection and feels the lesions much better. Denies CP/palp/SOB/HA/congestion/fevers/GI or GU c/o. Taking meds as prescribed Patient Care Team: Bradd CanaryBlyth, Zalika Tieszen A, MD as PCP - General (Family Medicine)   Past Medical History:  Diagnosis Date  . Abdominal bloating 09/12/2015  . Allergic state 02/08/2013  . Aortic stenosis   . Arthritis   . Congestive heart failure (HCC)    September 2011-CHF  . Depression   . Diabetes mellitus type 2 in obese (HCC) 04/16/2016  . History of chicken pox   . History of chronic bronchitis    dx 2011  . History of stroke 1992   blood clot at base of brain-[facility]  . Hyperlipidemia   . Hypertension   . Leukocytosis 03/04/2017  . Seasonal allergies   . Stroke (HCC)   . Tobacco abuse 11/27/2012   Down from 3 ppd to 1/2 ppd  . Unspecified hereditary and idiopathic peripheral neuropathy 05/26/2014   Tingling in toes    Past Surgical History:  Procedure Laterality Date  . KNEE SURGERY  1976, 1989   right knee torn ligament repair  . LUMBAR FUSION  1982   L4-L5  . REVISION TOTAL KNEE ARTHROPLASTY  2007   right knee replacement    Family History  Problem Relation Age of Onset  . Arthritis Unknown        mother/father/paternal grandparents  . Heart disease Father    pacemaker  . Hypertension Father   . Hypertension Paternal Grandfather   . Hypertension Brother   . Emotional abuse Mother   . Diabetes Brother         x 2  . Diabetes Paternal Grandmother     Social History   Social History  . Marital status: Widowed    Spouse name: N/A  . Number of children: 2  . Years of education: 16   Occupational History  . Security  [employer]   Social History Main Topics  . Smoking status: Current Some Day Smoker    Packs/day: 1.00  . Smokeless tobacco: Never Used     Comment: smokes a pack per day-started in 1972, trying to stop smokes 10 per day.  . Alcohol use No  . Drug use: No  . Sexual activity: No   Other Topics Concern  . Not on file   Social History Narrative   Regular exercise-no   Caffeine Use-yes          No facility-administered medications prior to visit.    Outpatient Medications Prior to Visit  Medication Sig Dispense Refill  . albuterol (PROVENTIL HFA;VENTOLIN HFA) 108 (90 BASE) MCG/ACT inhaler Inhale 2 puffs into the lungs every 6 (six) hours as needed for wheezing. 1 Inhaler 1  . amLODipine (NORVASC) 10 MG tablet Take  1 tablet (10 mg total) by mouth daily. 90 tablet 1  . apixaban (ELIQUIS) 5 MG TABS tablet Take 1 tablet (5 mg total) by mouth 2 (two) times daily. 60 tablet 2  . aspirin 81 MG tablet Take 1 tablet (81 mg total) by mouth daily. 30 tablet 3  . atorvastatin (LIPITOR) 40 MG tablet TAKE 1 TABLET DAILY 90 tablet 1  . chlorproMAZINE (THORAZINE) 25 MG tablet Take 1 tablet (25 mg total) by mouth 3 (three) times daily. 30 tablet 0  . furosemide (LASIX) 40 MG tablet Take 0.5 tablets (20 mg total) by mouth daily. 45 tablet 3  . gemfibrozil (LOPID) 600 MG tablet TAKE 1 TABLET TWICE A DAY BEFORE MEALS 180 tablet 0  . glucose blood (ONETOUCH VERIO) test strip 1 each by Other route 2 (two) times daily. And lancets 2/day 180 each 12  . Insulin Glargine (LANTUS SOLOSTAR) 100 UNIT/ML Solostar Pen Inject 240 Units into  the skin daily. 90 pen PRN  . Insulin Pen Needle 31G X 5 MM MISC USE AS DIRECTED ONE TIME DAILY. 150 each 1  . Krill Oil CAPS MegaRed or a generic by schiff daily    . Multiple Vitamin (MULTIVITAMIN) tablet Take 1 tablet by mouth daily.      . nicotine (NICODERM CQ - DOSED IN MG/24 HOURS) 14 mg/24hr patch Place 1 patch (14 mg total) onto the skin daily. 28 patch 0  . Probiotic Product (PROBIOTIC DAILY PO) Take 1 capsule by mouth daily.    . sertraline (ZOLOFT) 100 MG tablet TAKE ONE AND ONE-HALF TABLETS DAILY (Patient taking differently: TAKE 150mg  TABLETS DAILY) 135 tablet 1  . amoxicillin-clavulanate (AUGMENTIN) 875-125 MG tablet Take 1 tablet by mouth every 12 (twelve) hours. 12 tablet 0  . carvedilol (COREG) 25 MG tablet TAKE ONE AND ONE-HALF TABLETS TWICE A DAY (Patient taking differently: TAKE 37.5mg  TABLETS TWICE A DAY) 270 tablet 1    No Known Allergies  Review of Systems  Constitutional: Positive for malaise/fatigue.  Gastrointestinal: Positive for nausea.  Neurological: Positive for weakness.       Objective:    Physical Exam  Constitutional: He is oriented to person, place, and time. He appears well-developed and well-nourished. No distress.  HENT:  Head: Normocephalic and atraumatic.  Eyes: Conjunctivae are normal.  Neck: Neck supple. No thyromegaly present.  Cardiovascular: Normal rate, regular rhythm and normal heart sounds.   No murmur heard. Pulmonary/Chest: Effort normal and breath sounds normal. No respiratory distress. He has no wheezes.  Abdominal: Soft. Bowel sounds are normal. He exhibits no mass. There is no tenderness.  Musculoskeletal: He exhibits no edema.  Lymphadenopathy:    He has no cervical adenopathy.  Neurological: He is alert and oriented to person, place, and time.  Skin: Skin is warm and dry.  Healing lesion on left flank, scab in center with oblong lesion 2-3 cm red and subcutaneous but nontender  Psychiatric: He has a normal mood and affect.  His behavior is normal.    BP (!) 90/40 (BP Location: Left Arm, Patient Position: Sitting, Cuff Size: Normal)   Pulse 83   Temp 98.3 F (36.8 C) (Oral)   Resp 18   Wt 244 lb (110.7 kg)   SpO2 98%   BMI 31.33 kg/m  Wt Readings from Last 3 Encounters:  03/04/17 244 lb (110.7 kg)  02/18/17 238 lb 3.2 oz (108 kg)  10/06/16 256 lb 3.2 oz (116.2 kg)   BP Readings from Last 3 Encounters:  03/04/17 Marland Kitchen)  116/50  03/04/17 (!) 90/40  02/22/17 (!) 134/52     Immunization History  Administered Date(s) Administered  . Influenza Split 09/01/2011, 07/12/2012  . Influenza,inj,Quad PF,36+ Mos 06/22/2013, 08/26/2014, 09/04/2015, 10/07/2015, 08/10/2016  . Pneumococcal Conjugate-13 09/20/2013  . Pneumococcal Polysaccharide-23 01/30/2008, 09/04/2015  . Tdap 07/07/2010    Health Maintenance  Topic Date Due  . Hepatitis C Screening  October 12, 1951  . HIV Screening  02/02/1968  . COLONOSCOPY  02/02/2003  . URINE MICROALBUMIN  02/24/2016  . OPHTHALMOLOGY EXAM  11/25/2016  . INFLUENZA VACCINE  05/18/2017  . FOOT EXAM  07/12/2017  . HEMOGLOBIN A1C  08/24/2017  . TETANUS/TDAP  07/07/2020    Lab Results  Component Value Date   WBC 21.7 Repeated and verified X2. (HH) 03/04/2017   HGB 11.0 (L) 03/04/2017   HCT 33.0 (L) 03/04/2017   PLT 325.0 Platelet estimate appears normal. 03/04/2017   GLUCOSE 370 (H) 03/04/2017   CHOL 168 02/19/2017   TRIG 427 (H) 02/19/2017   HDL 19 (L) 02/19/2017   LDLDIRECT 99.0 09/02/2016   LDLCALC UNABLE TO CALCULATE IF TRIGLYCERIDE OVER 400 mg/dL 18/84/1660   ALT 7 63/10/6008   AST 8 03/04/2017   NA 125 (L) 03/04/2017   K 5.6 (H) 03/04/2017   CL 91 (L) 03/04/2017   CREATININE 2.12 (H) 03/04/2017   BUN 21 03/04/2017   CO2 26 03/04/2017   TSH 0.77 03/04/2017   PSA 0.35 03/09/2012   INR 1.15 02/18/2017   HGBA1C 12.2 (H) 02/21/2017   MICROALBUR 9.4 (H) 02/24/2015    Lab Results  Component Value Date   TSH 0.77 03/04/2017   Lab Results  Component Value Date    WBC 21.7 Repeated and verified X2. (HH) 03/04/2017   HGB 11.0 (L) 03/04/2017   HCT 33.0 (L) 03/04/2017   MCV 91.0 03/04/2017   PLT 325.0 Platelet estimate appears normal. 03/04/2017   Lab Results  Component Value Date   NA 125 (L) 03/04/2017   K 5.6 (H) 03/04/2017   CO2 26 03/04/2017   GLUCOSE 370 (H) 03/04/2017   BUN 21 03/04/2017   CREATININE 2.12 (H) 03/04/2017   BILITOT 0.4 03/04/2017   ALKPHOS 68 03/04/2017   AST 8 03/04/2017   ALT 7 03/04/2017   PROT 6.7 03/04/2017   ALBUMIN 3.5 03/04/2017   CALCIUM 8.9 03/04/2017   ANIONGAP 11 02/22/2017   GFR 33.59 (L) 03/04/2017   Lab Results  Component Value Date   CHOL 168 02/19/2017   Lab Results  Component Value Date   HDL 19 (L) 02/19/2017   Lab Results  Component Value Date   LDLCALC UNABLE TO CALCULATE IF TRIGLYCERIDE OVER 400 mg/dL 93/23/5573   Lab Results  Component Value Date   TRIG 427 (H) 02/19/2017   Lab Results  Component Value Date   CHOLHDL 8.8 02/19/2017   Lab Results  Component Value Date   HGBA1C 12.2 (H) 02/21/2017         Assessment & Plan:   Problem List Items Addressed This Visit    HTN (hypertension)    hypotension today. Dropped his Carvedilol to 25 mg po bid and check bp next week      Relevant Medications   carvedilol (COREG) 25 MG tablet   Abnormal EKG   Relevant Orders   Ambulatory referral to Cardiology   Diabetes mellitus type 2 in obese (HCC)    Sugars have been increasing recently. Over 300 today. Given samples of Humalog flexpen to use 5 units tid with meals  and when sugar is over 250. He is given new referral to endocrinology      Elevated troponin - Primary    Was elevated in hospital recently and today has EKG changes today suggestive of previous MI so is referred to cardiology for further evaluation      Relevant Orders   EKG 12-Lead (Completed)   Ambulatory referral to Cardiology   Leukocytosis    Significant elevation since last blood draw. Patient aware  and sent to ER for further work up.        Other Visit Diagnoses    Controlled type 2 diabetes mellitus with complication, with long-term current use of insulin (HCC)       Relevant Orders   Ambulatory referral to Endocrinology   Ambulatory referral to Cardiology   Hematuria, unspecified type       Relevant Orders   Urinalysis   Urine culture   Hypotension, unspecified hypotension type       Relevant Medications   carvedilol (COREG) 25 MG tablet   Other Relevant Orders   CBC with Differential/Platelet (Completed)   Comprehensive metabolic panel (Completed)   TSH (Completed)   Ambulatory referral to Cardiology      I have changed Mr. Malerba carvedilol. I am also having him maintain his multivitamin, Krill Oil, albuterol, Insulin Pen Needle, Probiotic Product (PROBIOTIC DAILY PO), glucose blood, atorvastatin, sertraline, Insulin Glargine, gemfibrozil, chlorproMAZINE, nicotine, apixaban, amLODipine, furosemide, aspirin, and amoxicillin-clavulanate.  Meds ordered this encounter  Medications  . amoxicillin-clavulanate (AUGMENTIN) 875-125 MG tablet    Sig: Take 1 tablet by mouth every 12 (twelve) hours.    Dispense:  12 tablet    Refill:  0  . carvedilol (COREG) 25 MG tablet    Sig: Take 1 tablet (25 mg total) by mouth 2 (two) times daily with a meal.    Dispense:  270 tablet    Refill:  1    CMA served as scribe during this visit. History, Physical and Plan performed by medical provider. Documentation and orders reviewed and attested to.  Danise Edge, MD

## 2017-03-04 NOTE — Assessment & Plan Note (Signed)
Significant elevation since last blood draw. Patient aware and sent to ER for further work up.

## 2017-03-04 NOTE — Telephone Encounter (Signed)
thanks

## 2017-03-04 NOTE — Assessment & Plan Note (Signed)
hypotension today. Dropped his Carvedilol to 25 mg po bid and check bp next week

## 2017-03-04 NOTE — Progress Notes (Signed)
Pharmacy Antibiotic Note  Gabriel [name] is a 66 y.o. male admitted on 03/04/2017 with pneumonia.  Pharmacy has been consulted for vancomycin dosing for 8 days. Patient is afebrile, WBC elevated at 21.7, normalized CrCl ~ 36 ml/min  Plan: Vancomycin 2000 mg x1 then 1250 mg IV every 24 hours.  Goal trough 15-20 mcg/mL. Cefepime 1g IV Q8H per MD Monitor renal function, cultures, vanc trough at steady state    Temp (24hrs), Avg:98.3 F (36.8 C), Min:98.3 F (36.8 C), Max:98.3 F (36.8 C)   Recent Labs Lab 03/04/17 1041  WBC 21.7 Repeated and verified X2.*  CREATININE 2.12*    Estimated Creatinine Clearance: 46.6 mL/min (A) (by C-G formula based on SCr of 2.12 mg/dL (H)).    No Known Allergies  Antimicrobials this admission: 5/18 vanc >> (5/25) 5/18 cefepime >> (5/25)  Dose adjustments this admission: n/a  Microbiology results: 5/18 BCx:  5/18 UCx:  5/18 Strep pneumo urine:  Thank you for allowing pharmacy to be a part of this patient's care.  Tenna ChildRenee J Ackley 03/04/2017 4:55 PM

## 2017-03-04 NOTE — Telephone Encounter (Signed)
Received Physician Orders from [facility], forwarded to provider/SLS 05/18

## 2017-03-04 NOTE — ED Provider Notes (Signed)
[facility] Provider Note   CSN: [id number] Arrival date & time: 03/04/17  1504     History   Chief Complaint Chief Complaint  Patient presents with  . Abnormal Lab    HPI Gabriel [name] is a 66 y.o. male.  HPI Patient seen at PCP office today. Patient reports he has had a cough for several days. He has had some general fatigue. No localizing symptoms. He denies he thinks raising pain. Patient reports that the cough has been productive. He denies vomiting or diarrhea. He denies abdominal pain. Patient had a stroke at the beginning of the month. He reports he is recovering from that. He states he has recovered a significant amount of function but still has some left upper extremity weakness. After his diagnostic studies were done as an outpatient he was called to come back to the emergency department for treatment. Past Medical History:  Diagnosis Date  . Abdominal bloating 09/12/2015  . Allergic state 02/08/2013  . Aortic stenosis   . Arthritis   . Congestive heart failure (HCC)    September 2011-CHF  . Depression   . Diabetes mellitus type 2 in obese (HCC) 04/16/2016  . History of chicken pox   . History of chronic bronchitis    dx 2011  . History of stroke 1992   blood clot at base of brain-[facility]  . Hyperlipidemia   . Hypertension   . Leukocytosis 03/04/2017  . Seasonal allergies   . Stroke (HCC)   . Tobacco abuse 11/27/2012   Down from 3 ppd to 1/2 ppd  . Unspecified hereditary and idiopathic peripheral neuropathy 05/26/2014   Tingling in toes    Patient Active Problem List   Diagnosis Date Noted  . Leukocytosis 03/04/2017  . Uncontrolled type 2 diabetes mellitus with hyperglycemia, with long-term current use of insulin (HCC) 02/22/2017  . Acute renal failure superimposed on stage 3 chronic kidney disease (HCC) 02/22/2017  . Furunculosis 02/22/2017  . Paroxysmal atrial fibrillation (HCC)   . Acute CVA (cerebrovascular accident) (HCC)  02/19/2017  . Hyponatremia 02/18/2017  . Elevated troponin 02/18/2017  . Acute upper respiratory infection 09/02/2016  . Diabetes mellitus type 2 in obese (HCC) 04/16/2016  . Abdominal bloating 09/12/2015  . Neck pain 03/08/2015  . Skin lesion 03/08/2015  . Preventative health care 03/08/2015  . Back pain with left-sided radiculopathy 02/03/2015  . Hereditary and idiopathic peripheral neuropathy 05/26/2014  . Aortic stenosis 09/19/2013  . Hyperlipidemia 09/19/2013  . Acute low back pain with radicular symptoms, duration less than 6 weeks 05/16/2013  . Allergic state 02/08/2013  . Obesity 11/27/2012  . Tobacco abuse 11/27/2012  . Palpitations 11/08/2012  . Abnormal EKG 11/08/2012  . ED (erectile dysfunction) 03/17/2012  . HTN (hypertension) 09/01/2011    Past Surgical History:  Procedure Laterality Date  . KNEE SURGERY  1976, 1989   right knee torn ligament repair  . LUMBAR FUSION  1982   L4-L5  . REVISION TOTAL KNEE ARTHROPLASTY  2007   right knee replacement       Home Medications    Prior to Admission medications   Medication Sig Start Date End Date Taking? Authorizing Provider  albuterol (PROVENTIL HFA;VENTOLIN HFA) 108 (90 BASE) MCG/ACT inhaler Inhale 2 puffs into the lungs every 6 (six) hours as needed for wheezing. 05/29/15 11/23/17  Bradd Canary, MD  amLODipine (NORVASC) 10 MG tablet Take 1 tablet (10 mg total) by mouth daily. 03/03/17   Bradd Canary, MD  amoxicillin-clavulanate (AUGMENTIN) 875-125 MG tablet Take 1 tablet by mouth every 12 (twelve) hours. 03/04/17   Bradd CanaryBlyth, Stacey A, MD  apixaban (ELIQUIS) 5 MG TABS tablet Take 1 tablet (5 mg total) by mouth 2 (two) times daily. 02/28/17   Bradd CanaryBlyth, Stacey A, MD  aspirin 81 MG tablet Take 1 tablet (81 mg total) by mouth daily. 03/03/17   Bradd CanaryBlyth, Stacey A, MD  atorvastatin (LIPITOR) 40 MG tablet TAKE 1 TABLET DAILY 06/22/16   Bradd CanaryBlyth, Stacey A, MD  carvedilol (COREG) 25 MG tablet Take 1 tablet (25 mg total) by mouth 2 (two)  times daily with a meal. 03/04/17   Bradd CanaryBlyth, Stacey A, MD  chlorproMAZINE (THORAZINE) 25 MG tablet Take 1 tablet (25 mg total) by mouth 3 (three) times daily. 02/18/17   Sharlene DoryWendling, Nicholas Paul, DO  furosemide (LASIX) 40 MG tablet Take 0.5 tablets (20 mg total) by mouth daily. 03/03/17   Bradd CanaryBlyth, Stacey A, MD  gemfibrozil (LOPID) 600 MG tablet TAKE 1 TABLET TWICE A DAY BEFORE MEALS 12/13/16   Bradd CanaryBlyth, Stacey A, MD  glucose blood (ONETOUCH VERIO) test strip 1 each by Other route 2 (two) times daily. And lancets 2/day 03/10/16   Romero BellingEllison, Sean, MD  Insulin Glargine (LANTUS SOLOSTAR) 100 UNIT/ML Solostar Pen Inject 240 Units into the skin daily. 11/12/16   Romero BellingEllison, Sean, MD  Insulin Pen Needle 31G X 5 MM MISC USE AS DIRECTED ONE TIME DAILY. 12/31/15   Romero BellingEllison, Sean, MD  [place] LaniusKrill Oil CAPS MegaRed or a generic by schiff daily 11/27/12   Bradd CanaryBlyth, Stacey A, MD  Multiple Vitamin (MULTIVITAMIN) tablet Take 1 tablet by mouth daily.      [provider]  nicotine (NICODERM CQ - DOSED IN MG/24 HOURS) 14 mg/24hr patch Place 1 patch (14 mg total) onto the skin daily. 02/22/17   Dhungel, Theda BelfastNishant, MD  Probiotic Product (PROBIOTIC DAILY PO) Take 1 capsule by mouth daily.    [provider]  sertraline (ZOLOFT) 100 MG tablet TAKE ONE AND ONE-HALF TABLETS DAILY Patient taking differently: TAKE 150mg  TABLETS DAILY 09/20/16   Bradd CanaryBlyth, Stacey A, MD    Family History Family History  Problem Relation Age of Onset  . Arthritis Unknown        mother/father/paternal grandparents  . Heart disease Father        pacemaker  . Hypertension Father   . Hypertension Paternal Grandfather   . Hypertension Brother   . Emotional abuse Mother   . Diabetes Brother         x 2  . Diabetes Paternal Grandmother     Social History Social History  Substance Use Topics  . Smoking status: Current Some Day Smoker    Packs/day: 1.00  . Smokeless tobacco: Never Used     Comment: smokes a pack per day-started in 1972, trying to stop  smokes 10 per day.  . Alcohol use No     Allergies   Patient has no known allergies.   Review of Systems Review of Systems 10 Systems reviewed and are negative for acute change except as noted in the HPI.   Physical Exam Updated Vital Signs BP (!) 111/53   Pulse 71   Temp 98.3 F (36.8 C) (Oral)   Resp 17   SpO2 96%   Physical Exam  Constitutional: He is oriented to person, place, and time.  Patient is alert and nontoxic. He does not have respiratory distress at rest. He does appear mildly deconditioned.  HENT:  Right Ear: External ear  normal.  Left Ear: External ear normal.  Mouth/Throat: Oropharynx is clear and moist.  Eyes: EOM are normal.  Neck: Neck supple.  Cardiovascular: Normal rate and regular rhythm.   2\6 systolic ejection murmur.  Pulmonary/Chest: Effort normal and breath sounds normal.  Abdominal: Soft. He exhibits no distension. There is no tenderness. There is no guarding.  Musculoskeletal: Normal range of motion.  Trace edema bilateral lower extremities. Calves are nontender.  Neurological: He is alert and oriented to person, place, and time.  Patient has use of bilateral upper extremities. Left is slightly weaker than right. He can spontaneously however elevate the left upper extremity and hold without support. Grip strength is intact.  Skin: Skin is warm and dry. There is pallor.  Psychiatric: He has a normal mood and affect.     ED Treatments / Results  Labs (all labs ordered are listed, but only abnormal results are displayed) Labs Reviewed  CULTURE, BLOOD (ROUTINE X 2)  CULTURE, BLOOD (ROUTINE X 2)  STREP PNEUMONIAE URINARY ANTIGEN  I-STAT CG4 LACTIC ACID, ED    EKG  EKG Interpretation None       Radiology Dg Chest 2 View  Result Date: 03/04/2017 CLINICAL DATA:  Elevated white blood cell count and cough EXAM: CHEST  2 VIEW COMPARISON:  Feb 19, 2017 FINDINGS: There is patchy infiltrate in the right base. There is a small left pleural  effusion. Lungs elsewhere clear. Heart is mildly enlarged with pulmonary vascularity within normal limits. No adenopathy. There is postoperative change in the lower cervical region. IMPRESSION: Patchy infiltrate posterior right base, concerning for focal pneumonia. Small left pleural effusion. Lungs elsewhere clear. There is mild cardiomegaly. Followup PA and lateral chest radiographs recommended in 3-4 weeks following trial of antibiotic therapy to ensure resolution and exclude underlying malignancy. Electronically Signed   By: Bretta Bang III M.D.   On: 03/04/2017 15:35    Procedures Procedures (including critical care time)  Medications Ordered in ED Medications  ceFEPIme (MAXIPIME) 1 g in dextrose 5 % 50 mL IVPB (0 g Intravenous Stopped 03/04/17 1743)  vancomycin (VANCOCIN) IVPB 1000 mg/200 mL premix (not administered)    Followed by  vancomycin (VANCOCIN) IVPB 1000 mg/200 mL premix (1,000 mg Intravenous New Bag/Given 03/04/17 1854)  vancomycin (VANCOCIN) 1,250 mg in sodium chloride 0.9 % 250 mL IVPB (not administered)  ceFEPIme (MAXIPIME) 1 g injection (not administered)  ipratropium-albuterol (DUONEB) 0.5-2.5 (3) MG/3ML nebulizer solution 3 mL (not administered)  albuterol (PROVENTIL) (2.5 MG/3ML) 0.083% nebulizer solution (2.5 mg  Given 03/04/17 1609)  ipratropium-albuterol (DUONEB) 0.5-2.5 (3) MG/3ML nebulizer solution (3 mLs  Given 03/04/17 1609)  0.9 %  sodium chloride infusion ( Intravenous New Bag/Given 03/04/17 1714)     Initial Impression / Assessment and Plan / ED Course  I have reviewed the triage vital signs and the nursing notes.  Pertinent labs & imaging results that were available during my care of the patient were reviewed by me and considered in my medical decision making (see chart for details).     Consult:dr. Mahadevappa for Admission to [facility] per patient's request for being closer to family.  Final Clinical Impressions(s) / ED Diagnoses     Final diagnoses:  HCAP (healthcare-associated pneumonia)  Hyponatremia  H/O: CVA (cerebrovascular accident)   Patient is referred to the emergency department for symptoms of pneumonia with chest x-ray with positive infiltrate and significant leukocytosis. Patient is status post CVA and hospitalization with discharge on 5\8. Treatment for healthcare associated  pneumonia implemented. Patient does not show signs of sepsis at this time. He does not have lactic acidosis or vital sign instability. He is transferred in stable condition. New Prescriptions New Prescriptions   No medications on file     Arby Barrette, MD 03/04/17 [phone]

## 2017-03-04 NOTE — Assessment & Plan Note (Addendum)
Was elevated in hospital recently and today has EKG changes today suggestive of previous MI so is referred to cardiology for further evaluation

## 2017-03-04 NOTE — ED Triage Notes (Addendum)
Pt seen at PCP for scheduled check up today. Pt called back and told to come to ED due to WBC 21. Pt reports not feeling well but no specific complaints other than cough. Pt recently d/c due to stroke.

## 2017-03-04 NOTE — Telephone Encounter (Signed)
Received critical white count of 21.7 Dr. Laury AxonLowne covering for PCP instructed the patient needs to go to the nearest ER asap and to let us know where he goes so we can call and let them know he is coming.  Called the patient and did speak to him informed of results/instructions, but before could complete message we were disconnected.  I did try and call back but he did not answer and had to leave a message to call me back.

## 2017-03-04 NOTE — Patient Instructions (Signed)
Carbohydrate Counting for Diabetes Mellitus, Adult Carbohydrate counting is a method for keeping track of how many carbohydrates you eat. Eating carbohydrates naturally increases the amount of sugar (glucose) in the blood. Counting how many carbohydrates you eat helps keep your blood glucose within normal limits, which helps you manage your diabetes (diabetes mellitus). It is important to know how many carbohydrates you can safely have in each meal. This is different for every person. A diet and nutrition specialist (registered dietitian) can help you make a meal plan and calculate how many carbohydrates you should have at each meal and snack. Carbohydrates are found in the following foods:  Grains, such as breads and cereals.  Dried beans and soy products.  Starchy vegetables, such as potatoes, peas, and corn.  Fruit and fruit juices.  Milk and yogurt.  Sweets and snack foods, such as cake, cookies, candy, chips, and soft drinks. How do I count carbohydrates? There are two ways to count carbohydrates in food. You can use either of the methods or a combination of both. Reading "Nutrition Facts" on packaged food  The "Nutrition Facts" list is included on the labels of almost all packaged foods and beverages in the [place] It includes:  The serving size.  Information about nutrients in each serving, including the grams (g) of carbohydrate per serving. To use the "Nutrition Facts":  Decide how many servings you will have.  Multiply the number of servings by the number of carbohydrates per serving.  The resulting number is the total amount of carbohydrates that you will be having. Learning standard serving sizes of other foods  When you eat foods containing carbohydrates that are not packaged or do not include "Nutrition Facts" on the label, you need to measure the servings in order to count the amount of carbohydrates:  Measure the foods that you will eat with a food scale or measuring  cup, if needed.  Decide how many standard-size servings you will eat.  Multiply the number of servings by 15. Most carbohydrate-rich foods have about 15 g of carbohydrates per serving.  For example, if you eat 8 oz (170 g) of strawberries, you will have eaten 2 servings and 30 g of carbohydrates (2 servings x 15 g = 30 g).  For foods that have more than one food mixed, such as soups and casseroles, you must count the carbohydrates in each food that is included. The following list contains standard serving sizes of common carbohydrate-rich foods. Each of these servings has about 15 g of carbohydrates:   hamburger bun or  English muffin.   oz (15 mL) syrup.   oz (14 g) jelly.  1 slice of bread.  1 six-inch tortilla.  3 oz (85 g) cooked rice or pasta.  4 oz (113 g) cooked dried beans.  4 oz (113 g) starchy vegetable, such as peas, corn, or potatoes.  4 oz (113 g) hot cereal.  4 oz (113 g) mashed potatoes or  of a large baked potato.  4 oz (113 g) canned or frozen fruit.  4 oz (120 mL) fruit juice.  4-6 crackers.  6 chicken nuggets.  6 oz (170 g) unsweetened dry cereal.  6 oz (170 g) plain fat-free yogurt or yogurt sweetened with artificial sweeteners.  8 oz (240 mL) milk.  8 oz (170 g) fresh fruit or one small piece of fruit.  24 oz (680 g) popped popcorn. Example of carbohydrate counting Sample meal  3 oz (85 g) chicken breast.  6 oz (  170 g) brown rice.  4 oz (113 g) corn.  8 oz (240 mL) milk.  8 oz (170 g) strawberries with sugar-free whipped topping. Carbohydrate calculation 1. Identify the foods that contain carbohydrates:  Rice.  Corn.  Milk.  Strawberries. 2. Calculate how many servings you have of each food:  2 servings rice.  1 serving corn.  1 serving milk.  1 serving strawberries. 3. Multiply each number of servings by 15 g:  2 servings rice x 15 g = 30 g.  1 serving corn x 15 g = 15 g.  1 serving milk x 15 g = 15  g.  1 serving strawberries x 15 g = 15 g. 4. Add together all of the amounts to find the total grams of carbohydrates eaten:  30 g + 15 g + 15 g + 15 g = 75 g of carbohydrates total. This information is not intended to replace advice given to you by your health care provider. Make sure you discuss any questions you have with your health care provider. Document Released: 10/04/2005 Document Revised: 04/23/2016 Document Reviewed: 03/17/2016 Elsevier Interactive Patient Education  2017 [employer]  

## 2017-03-05 LAB — URINE CULTURE: Organism ID, Bacteria: NO GROWTH

## 2017-03-09 ENCOUNTER — Telehealth: Payer: Self-pay | Admitting: Family Medicine

## 2017-03-09 ENCOUNTER — Other Ambulatory Visit: Payer: Self-pay | Admitting: Family Medicine

## 2017-03-09 ENCOUNTER — Telehealth (HOSPITAL_BASED_OUTPATIENT_CLINIC_OR_DEPARTMENT_OTHER): Payer: Self-pay | Admitting: *Deleted

## 2017-03-09 LAB — BLOOD CULTURE ID PANEL (REFLEXED)
Acinetobacter baumannii: NOT DETECTED
CANDIDA ALBICANS: NOT DETECTED
CANDIDA TROPICALIS: NOT DETECTED
Candida glabrata: NOT DETECTED
Candida krusei: NOT DETECTED
Candida parapsilosis: NOT DETECTED
ENTEROBACTERIACEAE SPECIES: NOT DETECTED
Enterobacter cloacae complex: NOT DETECTED
Enterococcus species: NOT DETECTED
Escherichia coli: NOT DETECTED
HAEMOPHILUS INFLUENZAE: NOT DETECTED
KLEBSIELLA PNEUMONIAE: NOT DETECTED
Klebsiella oxytoca: NOT DETECTED
Listeria monocytogenes: NOT DETECTED
NEISSERIA MENINGITIDIS: NOT DETECTED
PROTEUS SPECIES: NOT DETECTED
PSEUDOMONAS AERUGINOSA: NOT DETECTED
STAPHYLOCOCCUS SPECIES: NOT DETECTED
STREPTOCOCCUS AGALACTIAE: NOT DETECTED
STREPTOCOCCUS PNEUMONIAE: NOT DETECTED
STREPTOCOCCUS SPECIES: NOT DETECTED
Serratia marcescens: NOT DETECTED
Staphylococcus aureus (BCID): NOT DETECTED
Streptococcus pyogenes: NOT DETECTED

## 2017-03-09 LAB — CULTURE, BLOOD (ROUTINE X 2)
Culture: NO GROWTH
Special Requests: ADEQUATE

## 2017-03-12 LAB — CULTURE, BLOOD (ROUTINE X 2): Special Requests: ADEQUATE

## 2017-03-13 ENCOUNTER — Other Ambulatory Visit: Payer: Self-pay | Admitting: Family Medicine

## 2017-03-13 ENCOUNTER — Telehealth: Payer: Self-pay

## 2017-03-15 ENCOUNTER — Inpatient Hospital Stay: Payer: Self-pay | Admitting: Family Medicine

## 2017-03-18 NOTE — Telephone Encounter (Signed)
[facility] blood culture drawn on 03/04/2017. One anaerobic bottle of 4 bottles showed growth of gram+ rods a few hours before culture would have been declared no growth. BCID was negative, Suspect contaminant. [facility]. Discussed with Dr. Anitra LauthPlunkett. No further action needed.

## 2017-03-18 NOTE — Telephone Encounter (Signed)
Caller name: Ivonne [facility] Call back number:[phone] Pharmacy:  Reason for call: Ivonne call today to cancel pt's appt that was schedule and wanted to inform provider that pt passed away (deceased)

## 2017-03-18 NOTE — Telephone Encounter (Signed)
Noted.  Staff message sent to Mickie KayMichelle Callahan to change patient's status in Epic.

## 2017-03-18 DEATH — deceased

## 2017-03-19 ENCOUNTER — Other Ambulatory Visit: Payer: Self-pay | Admitting: Family Medicine

## 2017-03-23 ENCOUNTER — Telehealth: Payer: Self-pay | Admitting: *Deleted

## 2017-03-23 NOTE — Telephone Encounter (Signed)
Received Home Health Certification and Plan of Care Addendum to Plan of Treatment; forwarded to provider/SLS 06/06

## 2017-03-29 ENCOUNTER — Ambulatory Visit: Payer: Self-pay | Admitting: Family Medicine

## 2017-05-03 ENCOUNTER — Ambulatory Visit: Payer: BLUE CROSS/BLUE SHIELD | Admitting: Nurse Practitioner

## 2018-08-03 IMAGING — CT CT HEAD CODE STROKE
3 series · 16 of 47 positions shown, 19 images · non-contrast
Comparison: None

CLINICAL DATA: Code stroke. Slurred speech and right-sided
weakness.

EXAM:
CT HEAD WITHOUT CONTRAST
TECHNIQUE: Contiguous axial images were obtained from the base of the skull
through the vertex without intravenous contrast.

[Series 2: head wo · axial · 0.49mm/px · z∈[-154,-24]mm · 10 of 32 slices shown, 13 images]
[im 3/32  brain]
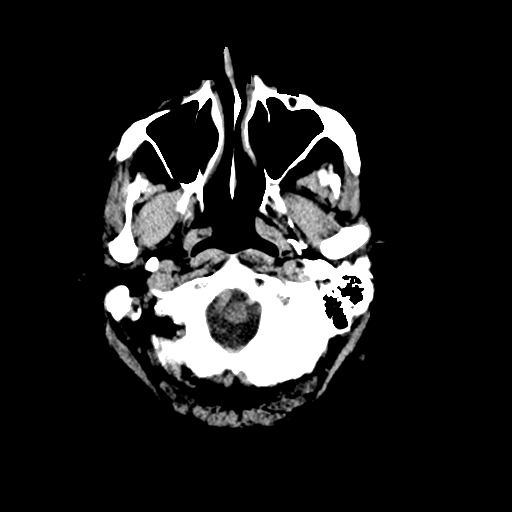
[im 3/32  bone]
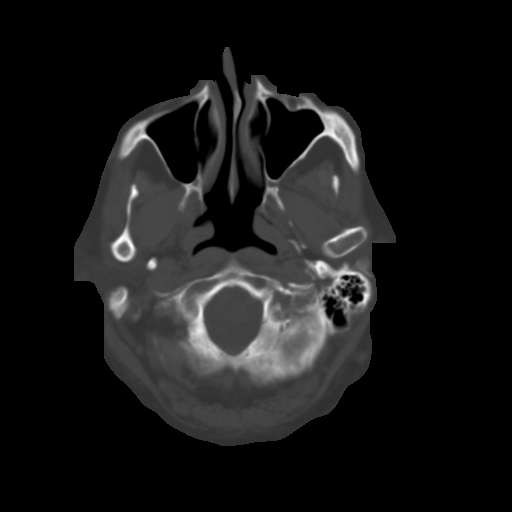
[im 6/32  brain]
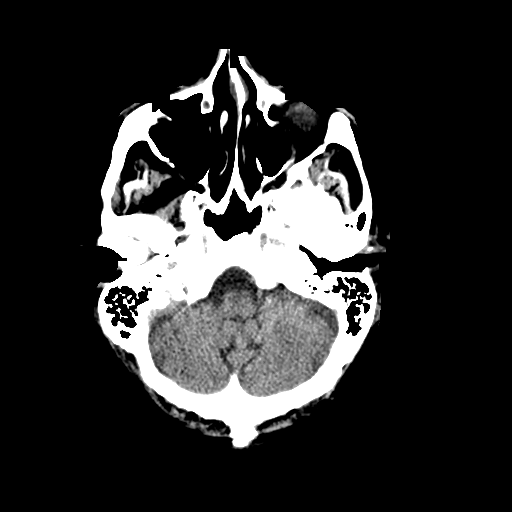
[im 9/32  brain]
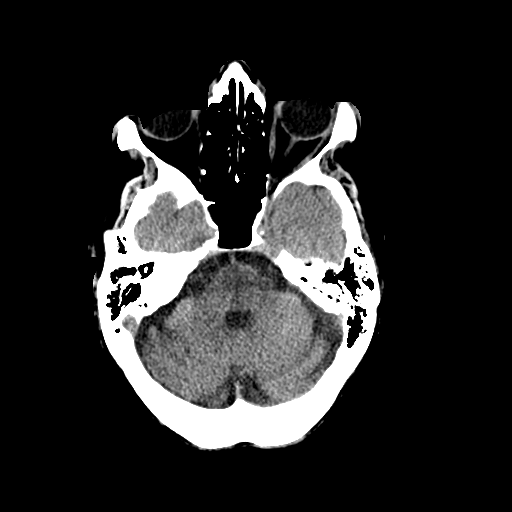
[im 11/32  brain]
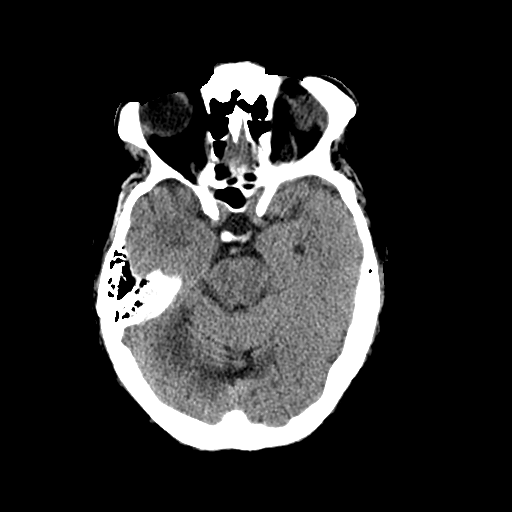
[im 14/32  brain]
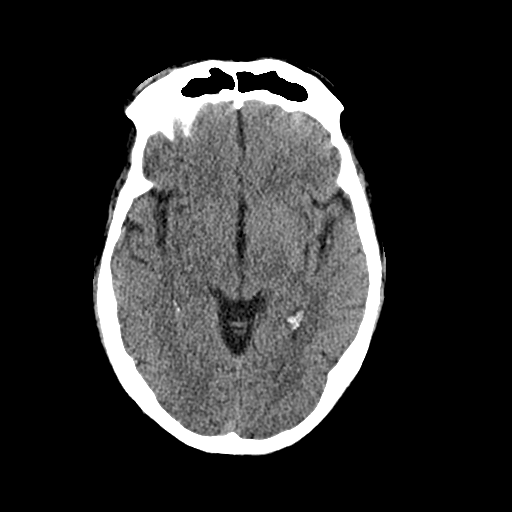
[im 14/32  bone]
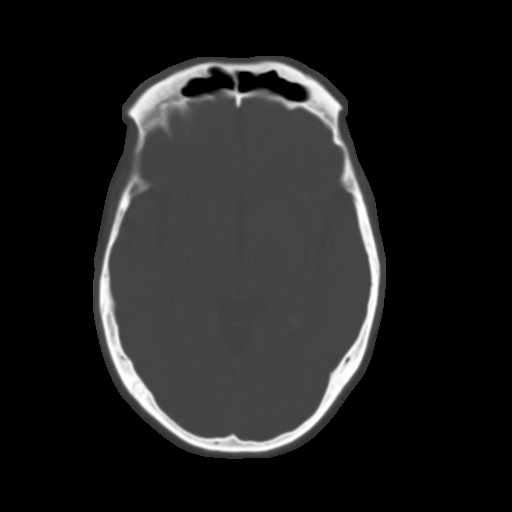
[im 18/32  brain]
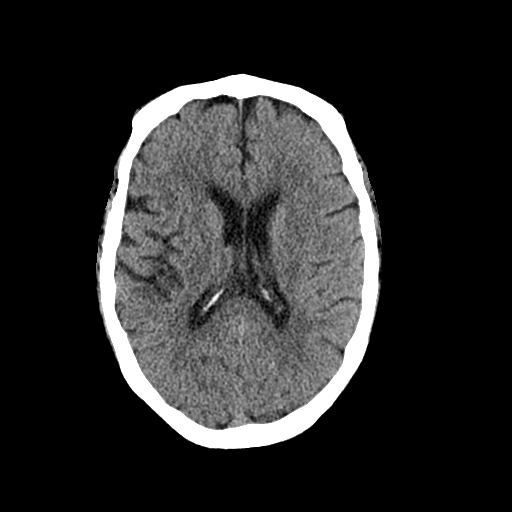
[im 21/32  brain]
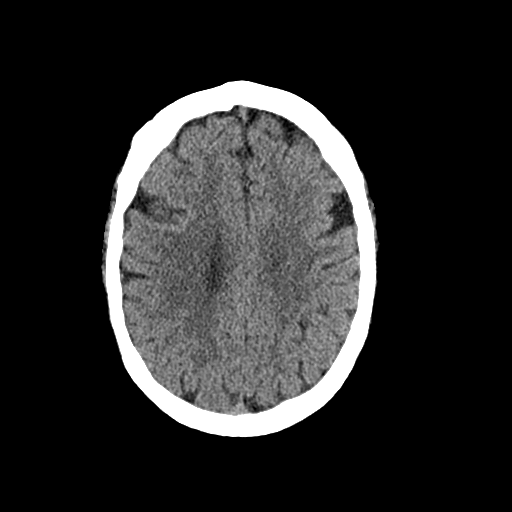
[im 24/32  brain]
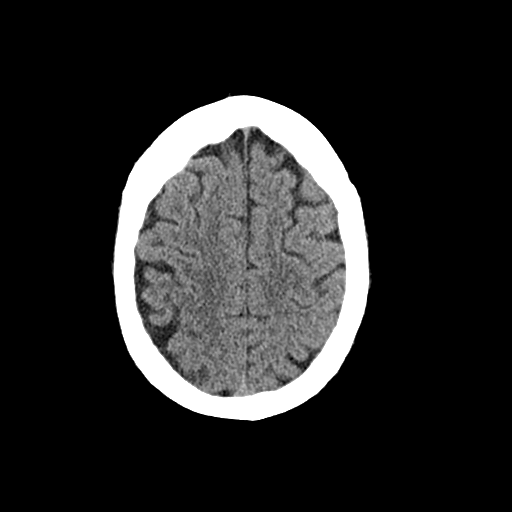
[im 26/32  brain]
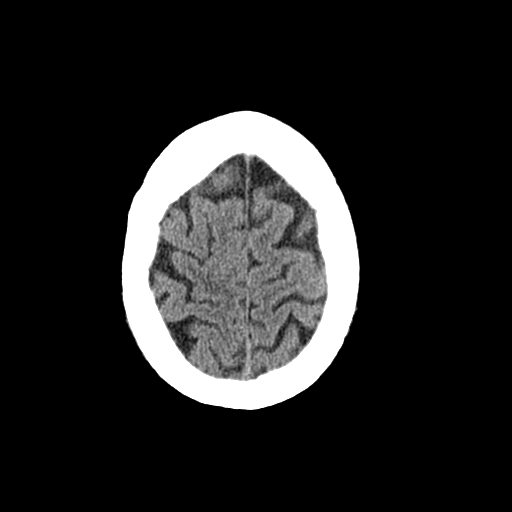
[im 26/32  bone]
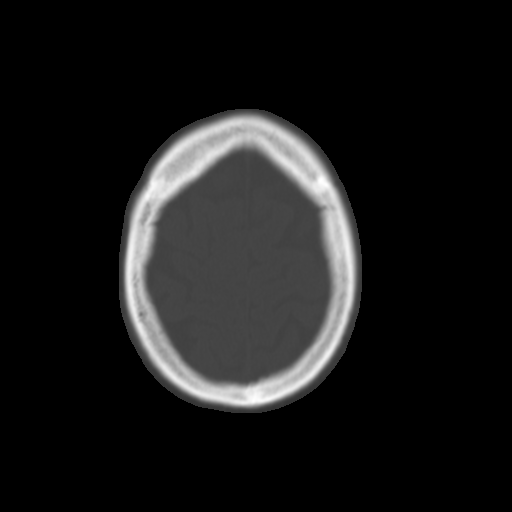
[im 29/32  brain]
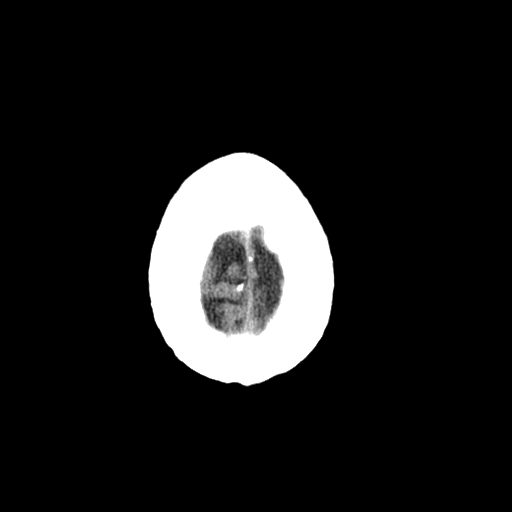

[Series 4: cor soft · coronal · 0.44mm/px · 3 of 70 slices shown]
[im 24/70  brain]
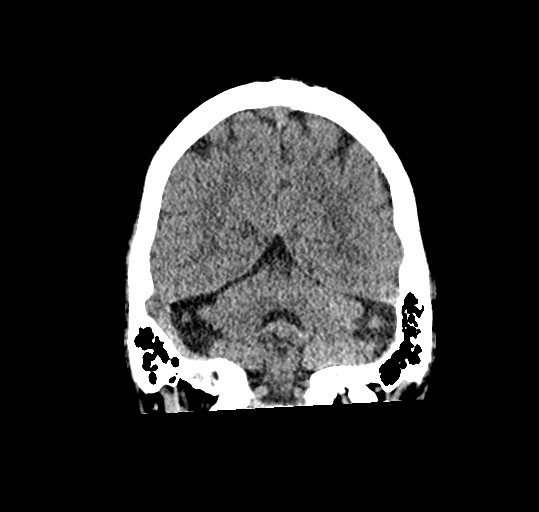
[im 31/70  brain]
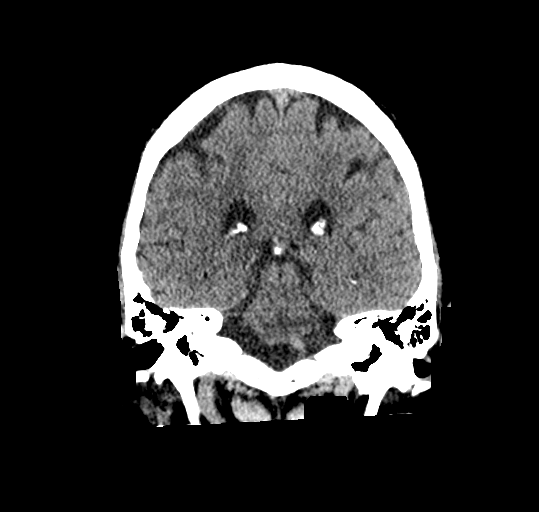
[im 39/70  brain]
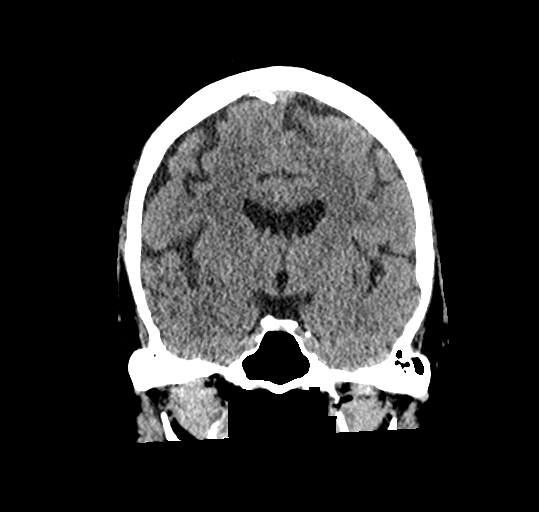

[Series 5: sag soft · sagittal · 0.43mm/px · 3 of 57 slices shown]
[im 19/57  brain]
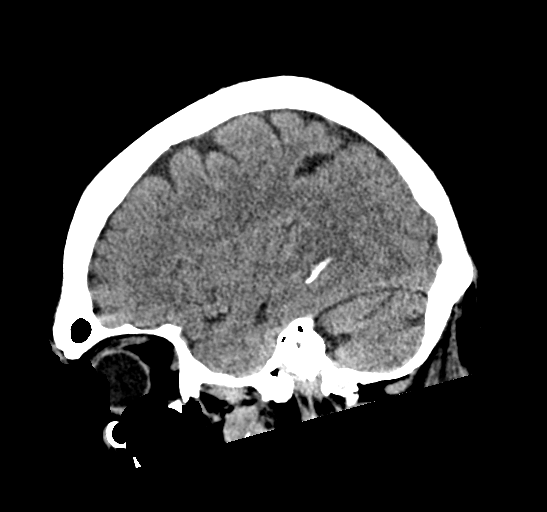
[im 29/57  brain]
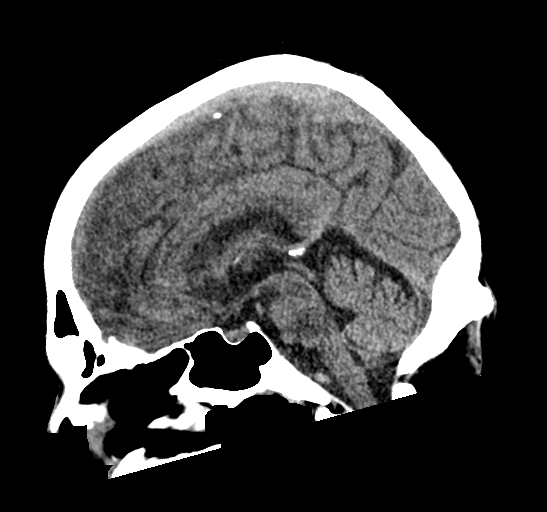
[im 38/57  brain]
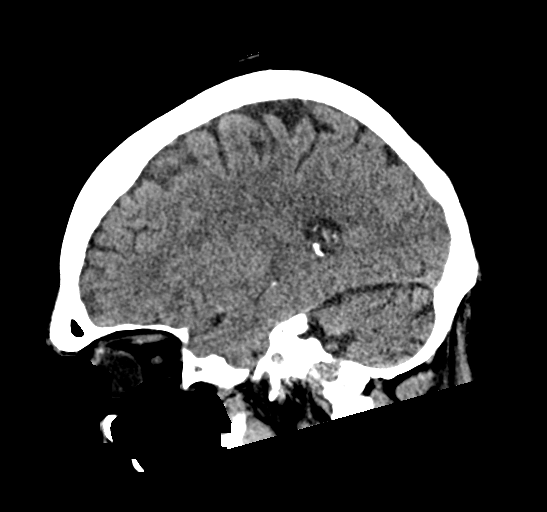

[16 of 47 positions shown; findings below may reference images not displayed]

FINDINGS: Brain: No evidence of acute infarction, hemorrhage, hydrocephalus,
extra-axial collection or mass lesion/mass effect.

Vascular: Atherosclerotic calcification.  No hyperdense vessel

Skull: No acute or aggressive finding

Sinuses/Orbits: Bilateral cataract resection.  No acute finding

Other: These results were called by telephone at the time of
interpretation on 02/18/2017 at [DATE] to Dr. REINERT MATHIAS SABUS , who
verbally acknowledged these results.

ASPECTS (Alberta Stroke Program Early CT Score)

- Ganglionic level infarction (caudate, lentiform nuclei, internal
capsule, insula, M1-M3 cortex): 7

- Supraganglionic infarction (M4-M6 cortex): 3

Total score (0-10 with 10 being normal): 10
IMPRESSION: No acute finding.ASPECTS is 10.

## 2018-08-04 IMAGING — DX DG CHEST 2V
2 series · 2 of 2 positions shown · non-contrast
Comparison: 05/29/2015

CLINICAL DATA: Acute wheezing

EXAM:
CHEST  2 VIEW

[chest pa]
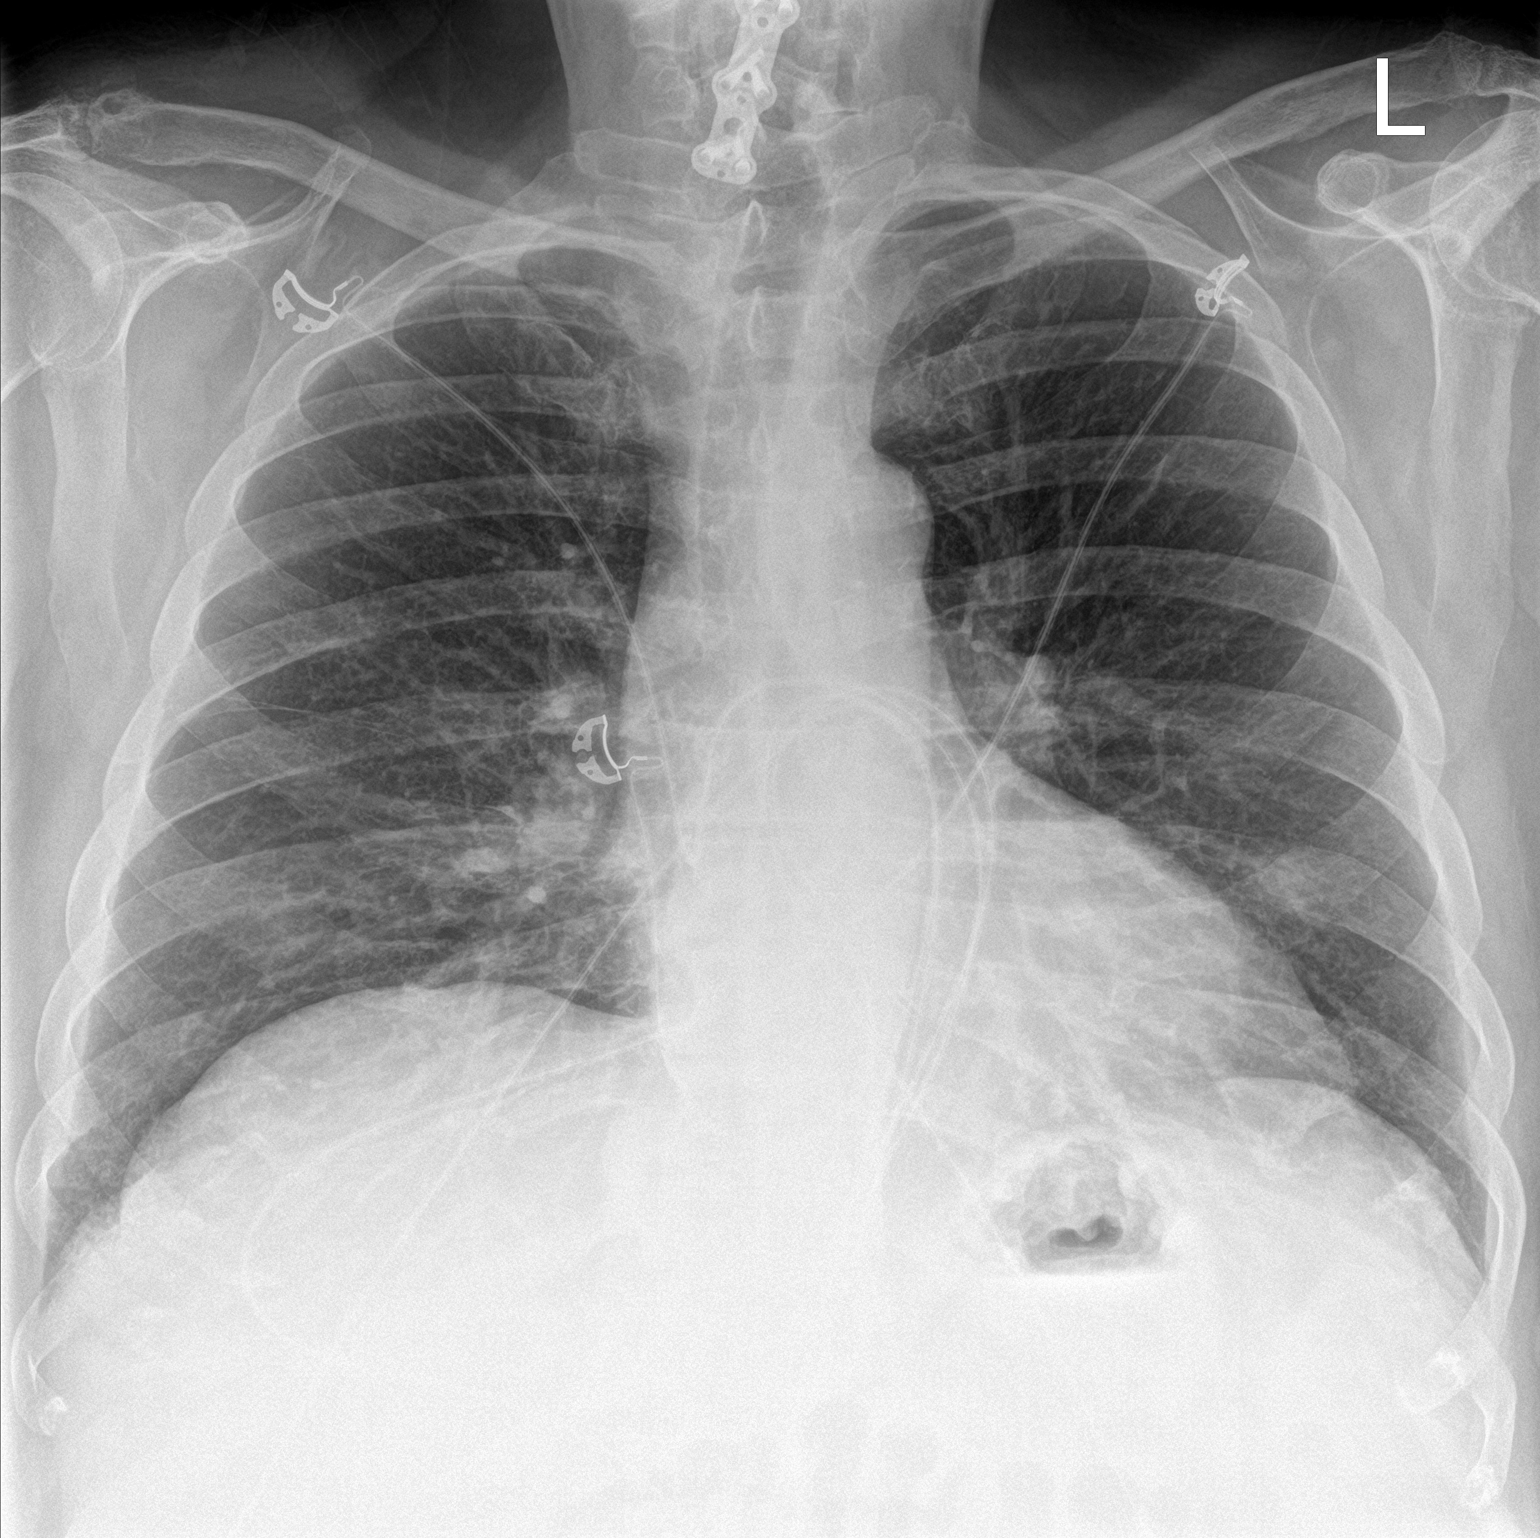

[chest lat]
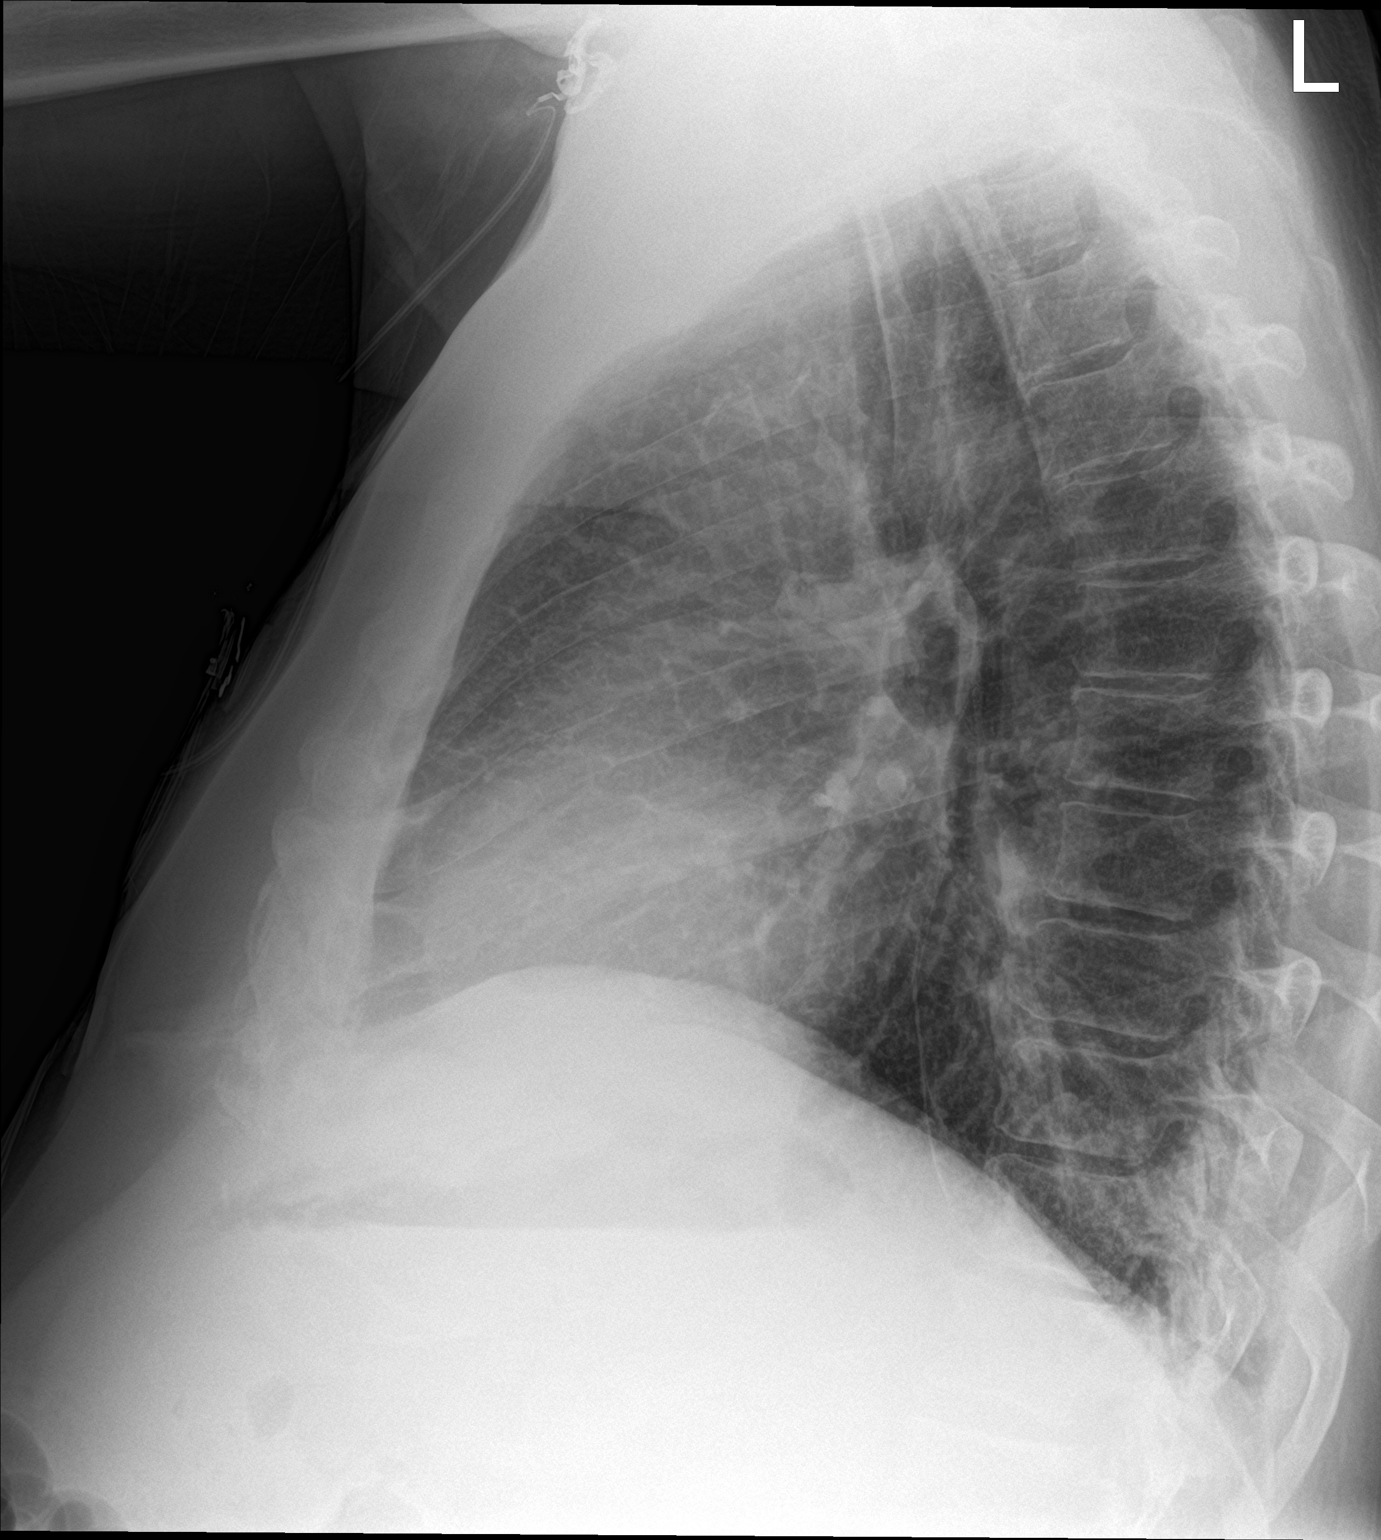

[2 of 2 positions shown; findings below may reference images not displayed]

FINDINGS: The heart size and mediastinal contours are within normal limits.
Both lungs are clear. The visualized skeletal structures are
unremarkable. Lower cervical fusion hardware partially imaged. Very
minor thoracic aortic atherosclerosis present. Trachea is midline.
No pleural effusion or pneumothorax.
IMPRESSION: No active cardiopulmonary disease.
